# Patient Record
Sex: Female | Born: 1975
Health system: Southern US, Community
[De-identification: ages and names within clinical notes are randomized; demographics above are authoritative.]

## PROBLEM LIST (undated history)

## (undated) DIAGNOSIS — G43909 Migraine, unspecified, not intractable, without status migrainosus: Secondary | ICD-10-CM

## (undated) DIAGNOSIS — R6510 Systemic inflammatory response syndrome (SIRS) of non-infectious origin without acute organ dysfunction: Secondary | ICD-10-CM

## (undated) DIAGNOSIS — N3090 Cystitis, unspecified without hematuria: Secondary | ICD-10-CM

## (undated) DIAGNOSIS — I38 Endocarditis, valve unspecified: Secondary | ICD-10-CM

## (undated) DIAGNOSIS — Z8489 Family history of other specified conditions: Secondary | ICD-10-CM

## (undated) HISTORY — PX: WISDOM TOOTH EXTRACTION: SHX21

---

## 2009-07-12 ENCOUNTER — Emergency Department (HOSPITAL_COMMUNITY): Admission: EM | Admit: 2009-07-12 | Discharge: 2009-07-12 | Payer: Self-pay | Admitting: Emergency Medicine

## 2012-02-07 ENCOUNTER — Encounter (HOSPITAL_COMMUNITY): Payer: Self-pay | Admitting: *Deleted

## 2012-02-07 ENCOUNTER — Emergency Department (HOSPITAL_COMMUNITY): Payer: 59

## 2012-02-07 ENCOUNTER — Emergency Department (HOSPITAL_COMMUNITY)
Admission: EM | Admit: 2012-02-07 | Discharge: 2012-02-07 | Disposition: A | Discharge status: Home / Self Care | Payer: 59 | Attending: Emergency Medicine | Admitting: Emergency Medicine

## 2012-02-07 DIAGNOSIS — O034 Incomplete spontaneous abortion without complication: Secondary | ICD-10-CM

## 2012-02-07 DIAGNOSIS — O039 Complete or unspecified spontaneous abortion without complication: Secondary | ICD-10-CM

## 2012-02-07 LAB — TYPE AND SCREEN
ABO/RH(D): B POS
Antibody Screen: NEGATIVE

## 2012-02-07 LAB — POCT I-STAT, CHEM 8
Calcium, Ion: 1.18 mmol/L (ref 1.12–1.23)
Chloride: 104 mEq/L (ref 96–112)
HCT: 42 % (ref 36.0–46.0)
Sodium: 138 mEq/L (ref 135–145)

## 2012-02-07 LAB — CBC
HCT: 36.2 % (ref 36.0–46.0)
Hemoglobin: 12 g/dL (ref 12.0–15.0)
MCH: 28 pg (ref 26.0–34.0)
MCHC: 33.1 g/dL (ref 30.0–36.0)
MCV: 84.6 fL (ref 78.0–100.0)
Platelets: 275 K/uL (ref 150–400)
RBC: 4.28 MIL/uL (ref 3.87–5.11)
RDW: 12.1 % (ref 11.5–15.5)
WBC: 7 K/uL (ref 4.0–10.5)

## 2012-02-07 LAB — ABO/RH: ABO/RH(D): B POS

## 2012-02-07 MED ORDER — IBUPROFEN 600 MG PO TABS: 600.0000 mg | ORAL_TABLET | Freq: Four times a day (QID) | ORAL | Status: AC | PRN

## 2012-02-07 MED ORDER — OXYCODONE-ACETAMINOPHEN 5-325 MG PO TABS: 2.0000 | ORAL_TABLET | Freq: Once | ORAL | Status: DC

## 2012-02-07 MED ORDER — ONDANSETRON 4 MG PO TBDP
4.0000 mg | ORAL_TABLET | Freq: Once | ORAL | Status: AC
  Administered 2012-02-07: 4 mg via ORAL
  Filled 2012-02-07: qty 1

## 2012-02-07 MED ORDER — HYDROMORPHONE HCL PF 1 MG/ML IJ SOLN
1.0000 mg | INTRAMUSCULAR | Status: AC
  Administered 2012-02-07: 1 mg via INTRAMUSCULAR
  Filled 2012-02-07: qty 1

## 2012-02-07 MED ORDER — HYDROCODONE-ACETAMINOPHEN 5-325 MG PO TABS: 2.0000 | ORAL_TABLET | Freq: Four times a day (QID) | ORAL | Status: AC | PRN

## 2012-02-07 MED ORDER — IBUPROFEN 200 MG PO TABS
600.0000 mg | ORAL_TABLET | Freq: Once | ORAL | Status: AC
  Administered 2012-02-07: 600 mg via ORAL
  Filled 2012-02-07: qty 3

## 2012-02-07 NOTE — ED Notes (Signed)
Pt reports taking a + preg test x 1 month ago.  Reports bleeding x 2 days, heavy today with clotting.  LMP-11/16/11.  Pt reports abd pain today.

## 2012-02-07 NOTE — ED Notes (Signed)
Patient transported to [facility] 

## 2012-02-07 NOTE — ED Notes (Signed)
Pt returned from [facility]

## 2012-02-07 NOTE — ED Provider Notes (Signed)
History     CSN: [id number]  Arrival date & time 02/07/12  1313   First MD Initiated Contact with Patient 02/07/12 1503      Chief Complaint  Patient presents with  . Vaginal Bleeding    (Consider location/radiation/quality/duration/timing/severity/associated sxs/prior treatment) HPI Krystal Shaffer is a 36 y.o. female G5 P3 A1, with last menstrual period 11/16/2011. She did take a pregnancy test which was positive about a month ago. She also does have a history of one prior spontaneous abortion occurred about 12 weeks pregnancy. On Monday the patient began having some spotting progressed to more brisk bleeding including clots. Patient's bili continued to Tuesday where she passed clots and much of the mid tissue. She's been having some cramping abdominal pain has been located in the lower abdomen and suprapubic region, does not radiate, is crampy, start off as a 5/10 and is now an 8/10. She noticed no dysuria, no fevers, no chills, no other changes in discharge.  The patient thinks she is having a miscarriage.  No surgical history the patient has a history of migraines.   History reviewed. No pertinent past medical history.  History reviewed. No pertinent past surgical history.  No family history on file.  History  Substance Use Topics  . Smoking status: Never Smoker   . Smokeless tobacco: Not on file  . Alcohol Use: Yes     social    OB History    Grav Para Term Preterm Abortions TAB SAB Ect Mult Living                  Review of Systems Positive for abdominal cramping and vaginal bleeding. Positive for pregnancy. Patient denies any fevers or chills, changes in vision, earache, sore throat, neck pain or stiffness, chest pain or pressure, palpitations, syncope, dyspnea, cough, wheezing,  nausea, vomiting, diarrhea, melena, red bloody stools, frequency, dysuria, myalgias, arthralgias, back pain, recent trauma, rash, itching, skin lesions, easy bruising or bleeding, headache,  seizures, numbness, tingling or weakness and denies depression, and anxiety.    Allergies  Review of patient's allergies indicates no known allergies.  Home Medications  No current outpatient prescriptions on file.  BP 134/88  Pulse 88  Temp 98.9 F (37.2 C) (Oral)  Resp 20  SpO2 97%  LMP 11/16/2011  Physical Exam VITAL SIGNS:   Filed Vitals:   02/07/12 1424  BP: 134/88  Pulse: 88  Temp: 98.9 F (37.2 C)  Resp: 20   CONSTITUTIONAL: Awake, oriented, appears non-toxic HENT: Atraumatic, normocephalic, oral mucosa pink and moist, airway patent. Nares patent without drainage. External ears normal. EYES: Conjunctiva clear, EOMI, PERRLA NECK: Trachea midline, non-tender, supple CARDIOVASCULAR: Normal heart rate, Normal rhythm, No murmurs, rubs, gallops PULMONARY/CHEST: Clear to auscultation, no rhonchi, wheezes, or rales. Symmetrical breath sounds. Non-tender. ABDOMINAL: Non-distended, soft, non-tender - no rebound or guarding.  BS normal. NEUROLOGIC: Non-focal, moving all four extremities, no gross sensory or motor deficits. EXTREMITIES: No clubbing, cyanosis, or edema SKIN: Warm, Dry, No erythema, No rash Pelvic exam: normal external genitalia, vulva, vagina with about 1/4 cup dark red blood and clot in vault, uterus and adnexa, CERVIX: normal appearing cervix without lesions, passing dark red blood and clot, multiparous os - os open.   ED Course  Procedures (including critical care time)  Labs Reviewed  POCT I-STAT, CHEM 8 - Abnormal; Notable for the following:    Potassium 3.3 (*)     BUN <3 (*)     All other components within normal  limits  POCT PREGNANCY, URINE - Abnormal; Notable for the following:    Preg Test, Ur POSITIVE (*)     All other components within normal limits  CBC  TYPE AND SCREEN  ABO/RH  LAB REPORT - SCANNED   No results found.   No diagnosis found.    MDM  Krystal Shaffer is a 36 y.o. female presenting with incomplete abortion. Bedside  ultrasound shows no FHR or movement with indistinct landmarks.  Formal ultrasound shows incomplete abortion.  Pt is non-toxic and afebrile, no foul odor on pelvic exam, abdomen is appropriately crampy, but non-tender and not surgical.  I discussed the diagnosis with the patient with the explicit concern from retained fetal products and the possibility that she may require DNC.  The patient understands her diagnosis, warning signs of infection and reasons tot return to the ER including worsening pain, foul discharge, fever, chills, nausea or vomiting or any other concerning symptoms.  She will follow up with her obstetrician.        Jones Skene, MD 02/13/12 1610

## 2012-02-07 NOTE — ED Notes (Signed)
MD at bedside. Dr. Bonk at bedside.  

## 2012-02-07 NOTE — ED Notes (Signed)
Pt medicated for pain. Pt states she has a ride home.

## 2013-07-22 ENCOUNTER — Encounter (HOSPITAL_COMMUNITY): Payer: Self-pay | Admitting: Emergency Medicine

## 2013-07-22 ENCOUNTER — Ambulatory Visit (HOSPITAL_COMMUNITY): Admission: RE | Admit: 2013-07-22 | Payer: 59 | Source: Ambulatory Visit

## 2013-07-22 ENCOUNTER — Emergency Department (HOSPITAL_COMMUNITY)
Admission: EM | Admit: 2013-07-22 | Discharge: 2013-07-22 | Disposition: A | Discharge status: Home / Self Care | Payer: 59 | Attending: Emergency Medicine | Admitting: Emergency Medicine

## 2013-07-22 ENCOUNTER — Emergency Department (HOSPITAL_COMMUNITY): Payer: 59

## 2013-07-22 DIAGNOSIS — J159 Unspecified bacterial pneumonia: Secondary | ICD-10-CM | POA: Insufficient documentation

## 2013-07-22 DIAGNOSIS — Z79899 Other long term (current) drug therapy: Secondary | ICD-10-CM | POA: Insufficient documentation

## 2013-07-22 DIAGNOSIS — O9989 Other specified diseases and conditions complicating pregnancy, childbirth and the puerperium: Secondary | ICD-10-CM | POA: Insufficient documentation

## 2013-07-22 DIAGNOSIS — R55 Syncope and collapse: Secondary | ICD-10-CM | POA: Insufficient documentation

## 2013-07-22 DIAGNOSIS — Z87828 Personal history of other (healed) physical injury and trauma: Secondary | ICD-10-CM | POA: Insufficient documentation

## 2013-07-22 DIAGNOSIS — J189 Pneumonia, unspecified organism: Secondary | ICD-10-CM

## 2013-07-22 DIAGNOSIS — O210 Mild hyperemesis gravidarum: Secondary | ICD-10-CM | POA: Insufficient documentation

## 2013-07-22 DIAGNOSIS — Z792 Long term (current) use of antibiotics: Secondary | ICD-10-CM | POA: Insufficient documentation

## 2013-07-22 DIAGNOSIS — K5900 Constipation, unspecified: Secondary | ICD-10-CM

## 2013-07-22 DIAGNOSIS — G43909 Migraine, unspecified, not intractable, without status migrainosus: Secondary | ICD-10-CM | POA: Insufficient documentation

## 2013-07-22 DIAGNOSIS — Z3490 Encounter for supervision of normal pregnancy, unspecified, unspecified trimester: Secondary | ICD-10-CM

## 2013-07-22 HISTORY — DX: Migraine, unspecified, not intractable, without status migrainosus: G43.909

## 2013-07-22 LAB — CBC WITH DIFFERENTIAL/PLATELET
Basophils Absolute: 0 10*3/uL (ref 0.0–0.1)
Basophils Relative: 0 % (ref 0–1)
Eosinophils Absolute: 0 10*3/uL (ref 0.0–0.7)
Eosinophils Relative: 1 % (ref 0–5)
HCT: 37.6 % (ref 36.0–46.0)
Hemoglobin: 12.4 g/dL (ref 12.0–15.0)
LYMPHS ABS: 1.8 10*3/uL (ref 0.7–4.0)
LYMPHS PCT: 29 % (ref 12–46)
MCH: 28.2 pg (ref 26.0–34.0)
MCHC: 33 g/dL (ref 30.0–36.0)
MCV: 85.6 fL (ref 78.0–100.0)
Monocytes Absolute: 0.6 10*3/uL (ref 0.1–1.0)
Monocytes Relative: 10 % (ref 3–12)
NEUTROS ABS: 3.8 10*3/uL (ref 1.7–7.7)
Neutrophils Relative %: 61 % (ref 43–77)
PLATELETS: 246 10*3/uL (ref 150–400)
RBC: 4.39 MIL/uL (ref 3.87–5.11)
RDW: 11.9 % (ref 11.5–15.5)
WBC: 6.3 10*3/uL (ref 4.0–10.5)

## 2013-07-22 LAB — URINALYSIS, ROUTINE W REFLEX MICROSCOPIC
GLUCOSE, UA: NEGATIVE mg/dL
Hgb urine dipstick: NEGATIVE
Ketones, ur: NEGATIVE mg/dL
LEUKOCYTES UA: NEGATIVE
Nitrite: NEGATIVE
PH: 6 (ref 5.0–8.0)
Protein, ur: NEGATIVE mg/dL
Specific Gravity, Urine: 1.028 (ref 1.005–1.030)
Urobilinogen, UA: 1 mg/dL (ref 0.0–1.0)

## 2013-07-22 LAB — COMPREHENSIVE METABOLIC PANEL
ALT: 9 U/L (ref 0–35)
AST: 13 U/L (ref 0–37)
Albumin: 3.9 g/dL (ref 3.5–5.2)
Alkaline Phosphatase: 46 U/L (ref 39–117)
BILIRUBIN TOTAL: 0.4 mg/dL (ref 0.3–1.2)
BUN: 8 mg/dL (ref 6–23)
CALCIUM: 9.3 mg/dL (ref 8.4–10.5)
CO2: 24 meq/L (ref 19–32)
Chloride: 99 mEq/L (ref 96–112)
Creatinine, Ser: 0.56 mg/dL (ref 0.50–1.10)
Glucose, Bld: 82 mg/dL (ref 70–99)
Potassium: 3.8 mEq/L (ref 3.7–5.3)
SODIUM: 135 meq/L — AB (ref 137–147)
Total Protein: 7.8 g/dL (ref 6.0–8.3)

## 2013-07-22 LAB — HCG, QUANTITATIVE, PREGNANCY: hCG, Beta Chain, Quant, S: 83383 m[IU]/mL — ABNORMAL HIGH (ref <5)

## 2013-07-22 LAB — LIPASE, BLOOD: Lipase: 36 U/L (ref 11–59)

## 2013-07-22 LAB — D-DIMER, QUANTITATIVE: D-Dimer, Quant: 0.29 ug/mL-FEU (ref 0.00–0.48)

## 2013-07-22 LAB — POCT PREGNANCY, URINE: Preg Test, Ur: POSITIVE — AB

## 2013-07-22 MED ORDER — PROCHLORPERAZINE EDISYLATE 5 MG/ML IJ SOLN
10.0000 mg | Freq: Once | INTRAMUSCULAR | Status: AC
  Administered 2013-07-22: 10 mg via INTRAVENOUS
  Filled 2013-07-22: qty 2

## 2013-07-22 MED ORDER — DIPHENHYDRAMINE HCL 50 MG/ML IJ SOLN
25.0000 mg | Freq: Once | INTRAMUSCULAR | Status: AC
  Administered 2013-07-22: 25 mg via INTRAVENOUS
  Filled 2013-07-22: qty 1

## 2013-07-22 MED ORDER — SODIUM CHLORIDE 0.9 % IV BOLUS (SEPSIS)
1000.0000 mL | Freq: Once | INTRAVENOUS | Status: AC
  Administered 2013-07-22: 1000 mL via INTRAVENOUS

## 2013-07-22 MED ORDER — KETOROLAC TROMETHAMINE 30 MG/ML IJ SOLN
30.0000 mg | Freq: Once | INTRAMUSCULAR | Status: AC
  Administered 2013-07-22: 30 mg via INTRAVENOUS
  Filled 2013-07-22 (×2): qty 1

## 2013-07-22 NOTE — ED Notes (Signed)
Patient transported to X-ray 

## 2013-07-22 NOTE — Progress Notes (Signed)
   CARE MANAGEMENT ED NOTE 07/22/2013  Patient:  DAMESHA, Krystal Shaffer   Account Number:  [id number]  Date Initiated:  07/22/2013  Documentation initiated by:  Livia Snellen  Subjective/Objective Assessment:   Patient presents to Ed with  migraine x 1.5 weeks and chest tightness and SOB x 2 days     Subjective/Objective Assessment Detail:     Action/Plan:   Action/Plan Detail:   Anticipated DC Date:       Status Recommendation to Physician:   Result of Recommendation:    Other ED [place]  Other  PCP issues    Choice offered to / List presented to:            Status of service:  Completed, signed off  ED Comments:   ED Comments Detail:  Patient reports her pcp is Everardo Beals NP from [facility].  System updated.

## 2013-07-22 NOTE — Discharge Instructions (Signed)
Read the information below.  You may return to the Emergency Department at any time for worsening condition or any new symptoms that concern you.  Please follow up with the ObGyn of your choice.  Take the medications as prescribed by [facility].  As we discussed, please discuss safety of medications in pregnancy if you decide not to terminate the pregnancy.  If you develop high fevers, uncontrolled pain, difficulty breathing, uncontrolled vomiting, or you are not tolerate fluids by mouth, return to the ER immediately for a recheck.    Constipation, Adult Constipation is when a person has fewer than 3 bowel movements a week; has difficulty having a bowel movement; or has stools that are dry, hard, or larger than normal. As people grow older, constipation is more common. If you try to fix constipation with medicines that make you have a bowel movement (laxatives), the problem may get worse. Long-term laxative use may cause the muscles of the colon to become weak. A low-fiber diet, not taking in enough fluids, and taking certain medicines may make constipation worse. CAUSES   Certain medicines, such as antidepressants, pain medicine, iron supplements, antacids, and water pills.   Certain diseases, such as diabetes, irritable bowel syndrome (IBS), thyroid disease, or depression.   Not drinking enough water.   Not eating enough fiber-rich foods.   Stress or travel.  Lack of physical activity or exercise.  Not going to the restroom when there is the urge to have a bowel movement.  Ignoring the urge to have a bowel movement.  Using laxatives too much. SYMPTOMS   Having fewer than 3 bowel movements a week.   Straining to have a bowel movement.   Having hard, dry, or larger than normal stools.   Feeling full or bloated.   Pain in the lower abdomen.  Not feeling relief after having a bowel movement. DIAGNOSIS  Your caregiver will take a medical history and perform a  physical exam. Further testing may be done for severe constipation. Some tests may include:   A barium enema X-ray to examine your rectum, colon, and sometimes, your small intestine.  A sigmoidoscopy to examine your lower colon.  A colonoscopy to examine your entire colon. TREATMENT  Treatment will depend on the severity of your constipation and what is causing it. Some dietary treatments include drinking more fluids and eating more fiber-rich foods. Lifestyle treatments may include regular exercise. If these diet and lifestyle recommendations do not help, your caregiver may recommend taking over-the-counter laxative medicines to help you have bowel movements. Prescription medicines may be prescribed if over-the-counter medicines do not work.  HOME CARE INSTRUCTIONS   Increase dietary fiber in your diet, such as fruits, vegetables, whole grains, and beans. Limit high-fat and processed sugars in your diet, such as [place] fries, hamburgers, cookies, candies, and soda.   A fiber supplement may be added to your diet if you cannot get enough fiber from foods.   Drink enough fluids to keep your urine clear or pale yellow.   Exercise regularly or as directed by your caregiver.   Go to the restroom when you have the urge to go. Do not hold it.  Only take medicines as directed by your caregiver. Do not take other medicines for constipation without talking to your caregiver first. [place] IF:   You have bright red blood in your stool.   Your constipation lasts for more than 4 days or gets worse.   You have  abdominal or rectal pain.   You have thin, pencil-like stools.  You have unexplained weight loss. MAKE SURE YOU:   Understand these instructions.  Will watch your condition.  Will get help right away if you are not doing well or get worse. Document Released: 02/25/2004 Document Revised: 08/21/2011 Document Reviewed: 03/10/2013 [facility] Patient Information  2014 [place], [place].  Headaches, Frequently Asked Questions MIGRAINE HEADACHES Q: What is migraine? What causes it? How can I treat it? A: Generally, migraine headaches begin as a dull ache. Then they develop into a constant, throbbing, and pulsating pain. You may experience pain at the temples. You may experience pain at the front or back of one or both sides of the head. The pain is usually accompanied by a combination of:  Nausea.  Vomiting.  Sensitivity to light and noise. Some people (about 15%) experience an aura (see below) before an attack. The cause of migraine is believed to be chemical reactions in the brain. Treatment for migraine may include over-the-counter or prescription medications. It may also include self-help techniques. These include relaxation training and biofeedback.  Q: What is an aura? A: About 15% of people with migraine get an "aura". This is a sign of neurological symptoms that occur before a migraine headache. You may see wavy or jagged lines, dots, or flashing lights. You might experience tunnel vision or blind spots in one or both eyes. The aura can include visual or auditory hallucinations (something imagined). It may include disruptions in smell (such as strange odors), taste or touch. Other symptoms include:  Numbness.  A "pins and needles" sensation.  Difficulty in recalling or speaking the correct word. These neurological events may last as long as 60 minutes. These symptoms will fade as the headache begins. Q: What is a trigger? A: Certain physical or environmental factors can lead to or "trigger" a migraine. These include:  Foods.  Hormonal changes.  Weather.  Stress. It is important to remember that triggers are different for everyone. To help prevent migraine attacks, you need to figure out which triggers affect you. Keep a headache diary. This is a good way to track triggers. The diary will help you talk to your healthcare professional about  your condition. Q: Does weather affect migraines? A: Bright sunshine, hot, humid conditions, and drastic changes in barometric pressure may lead to, or "trigger," a migraine attack in some people. But studies have shown that weather does not act as a trigger for everyone with migraines. Q: What is the link between migraine and hormones? A: Hormones start and regulate many of your body's functions. Hormones keep your body in balance within a constantly changing environment. The levels of hormones in your body are unbalanced at times. Examples are during menstruation, pregnancy, or menopause. That can lead to a migraine attack. In fact, about three quarters of all women with migraine report that their attacks are related to the menstrual cycle.  Q: Is there an increased risk of stroke for migraine sufferers? A: The likelihood of a migraine attack causing a stroke is very remote. That is not to say that migraine sufferers cannot have a stroke associated with their migraines. In persons under age 68, the most common associated factor for stroke is migraine headache. But over the course of a person's normal life span, the occurrence of migraine headache may actually be associated with a reduced risk of dying from cerebrovascular disease due to stroke.  Q: What are acute medications for migraine? A: Acute medications are  used to treat the pain of the headache after it has started. Examples over-the-counter medications, NSAIDs, ergots, and triptans.  Q: What are the triptans? A: Triptans are the newest class of abortive medications. They are specifically targeted to treat migraine. Triptans are vasoconstrictors. They moderate some chemical reactions in the brain. The triptans work on receptors in your brain. Triptans help to restore the balance of a neurotransmitter called serotonin. Fluctuations in levels of serotonin are thought to be a main cause of migraine.  Q: Are over-the-counter medications for migraine  effective? A: Over-the-counter, or "OTC," medications may be effective in relieving mild to moderate pain and associated symptoms of migraine. But you should see your caregiver before beginning any treatment regimen for migraine.  Q: What are preventive medications for migraine? A: Preventive medications for migraine are sometimes referred to as "prophylactic" treatments. They are used to reduce the frequency, severity, and length of migraine attacks. Examples of preventive medications include antiepileptic medications, antidepressants, beta-blockers, calcium channel blockers, and NSAIDs (nonsteroidal anti-inflammatory drugs). Q: Why are anticonvulsants used to treat migraine? A: During the past few years, there has been an increased interest in antiepileptic drugs for the prevention of migraine. They are sometimes referred to as "anticonvulsants". Both epilepsy and migraine may be caused by similar reactions in the brain.  Q: Why are antidepressants used to treat migraine? A: Antidepressants are typically used to treat people with depression. They may reduce migraine frequency by regulating chemical levels, such as serotonin, in the brain.  Q: What alternative therapies are used to treat migraine? A: The term "alternative therapies" is often used to describe treatments considered outside the scope of conventional Western medicine. Examples of alternative therapy include acupuncture, acupressure, and yoga. Another common alternative treatment is herbal therapy. Some herbs are believed to relieve headache pain. Always discuss alternative therapies with your caregiver before proceeding. Some herbal products contain arsenic and other toxins. TENSION HEADACHES Q: What is a tension-type headache? What causes it? How can I treat it? A: Tension-type headaches occur randomly. They are often the result of temporary stress, anxiety, fatigue, or anger. Symptoms include soreness in your temples, a tightening  band-like sensation around your head (a "vice-like" ache). Symptoms can also include a pulling feeling, pressure sensations, and contracting head and neck muscles. The headache begins in your forehead, temples, or the back of your head and neck. Treatment for tension-type headache may include over-the-counter or prescription medications. Treatment may also include self-help techniques such as relaxation training and biofeedback. CLUSTER HEADACHES Q: What is a cluster headache? What causes it? How can I treat it? A: Cluster headache gets its name because the attacks come in groups. The pain arrives with little, if any, warning. It is usually on one side of the head. A tearing or bloodshot eye and a runny nose on the same side of the headache may also accompany the pain. Cluster headaches are believed to be caused by chemical reactions in the brain. They have been described as the most severe and intense of any headache type. Treatment for cluster headache includes prescription medication and oxygen. SINUS HEADACHES Q: What is a sinus headache? What causes it? How can I treat it? A: When a cavity in the bones of the face and skull (a sinus) becomes inflamed, the inflammation will cause localized pain. This condition is usually the result of an allergic reaction, a tumor, or an infection. If your headache is caused by a sinus blockage, such as an infection, you will  probably have a fever. An x-ray will confirm a sinus blockage. Your caregiver's treatment might include antibiotics for the infection, as well as antihistamines or decongestants.  REBOUND HEADACHES Q: What is a rebound headache? What causes it? How can I treat it? A: A pattern of taking acute headache medications too often can lead to a condition known as "rebound headache." A pattern of taking too much headache medication includes taking it more than 2 days per week or in excessive amounts. That means more than the label or a caregiver advises.  With rebound headaches, your medications not only stop relieving pain, they actually begin to cause headaches. Doctors treat rebound headache by tapering the medication that is being overused. Sometimes your caregiver will gradually substitute a different type of treatment or medication. Stopping may be a challenge. Regularly overusing a medication increases the potential for serious side effects. Consult a caregiver if you regularly use headache medications more than 2 days per week or more than the label advises. ADDITIONAL QUESTIONS AND ANSWERS Q: What is biofeedback? A: Biofeedback is a self-help treatment. Biofeedback uses special equipment to monitor your body's involuntary physical responses. Biofeedback monitors:  Breathing.  Pulse.  Heart rate.  Temperature.  Muscle tension.  Brain activity. Biofeedback helps you refine and perfect your relaxation exercises. You learn to control the physical responses that are related to stress. Once the technique has been mastered, you do not need the equipment any more. Q: Are headaches hereditary? A: Four out of five (80%) of people that suffer report a family history of migraine. Scientists are not sure if this is genetic or a family predisposition. Despite the uncertainty, a child has a 50% chance of having migraine if one parent suffers. The child has a 75% chance if both parents suffer.  Q: Can children get headaches? A: By the time they reach high school, most young people have experienced some type of headache. Many safe and effective approaches or medications can prevent a headache from occurring or stop it after it has begun.  Q: What type of doctor should I see to diagnose and treat my headache? A: Start with your primary caregiver. Discuss his or her experience and approach to headaches. Discuss methods of classification, diagnosis, and treatment. Your caregiver may decide to recommend you to a headache specialist, depending upon your  symptoms or other physical conditions. Having diabetes, allergies, etc., may require a more comprehensive and inclusive approach to your headache. The National Headache Foundation will provide, upon request, a list of [facility] physician members in your state. Document Released: 08/19/2003 Document Revised: 08/21/2011 Document Reviewed: 01/27/2008 [facility] Patient Information 2014 [place].  Medicines During Pregnancy During pregnancy, there are medicines that are either safe or unsafe to take. Medicines include prescriptions from your caregiver, over-the-counter medicines, topical creams applied to the skin, and all herbal substances. Medicines are put into either Class A, B, C, or D. Class A and B medicines have been shown to be safe in pregnancy. Class C medicines are also considered to be safe in pregnancy, but these medicines should only be used when necessary. Class D medicines should not be used at all in pregnancy. They can be harmful to a baby.  It is best to take as little medicine as possible while pregnant. However, some medicines are necessary to take for the mother and baby's health. Sometimes, it is more dangerous to stop taking certain medicines than to stay on them. This is often the case for people with  long-term (chronic) conditions such as asthma, diabetes, or high blood pressure (hypertension). If you are pregnant and have a chronic illness, call your caregiver right away. Bring a list of your medicines and their doses to your appointments. If you are planning to become pregnant, schedule a doctor's appointment and discuss your medicines with your caregiver. Lastly, write down the phone number to your pharmacist. They can answer questions regarding a medicine's class and safety. They cannot give advice as to whether you should or should not be on a medicine.  SAFE AND UNSAFE MEDICINES There is a long list of medicines that are considered safe in pregnancy. Below is a shorter list. For  specific medicines, ask your caregiver.  AllergyMedicines Loratadine, cetirizine, and chlorpheniramine are safe to take. Certain nasal steroid sprays are safe. Talk to your caregiver about specific brands that are safe. Analgesics Acetaminophen and acetaminophen with codeine are safe to take. All other nonsteroidal anti-inflammatory drugs (NSAIDS) are not safe. This includes ibuprofen.  Antacids Many over-the-counter antacids are safe to take. Talk to your caregiver about specific brands that are safe. Famotidine, ranitidine, and lansoprazole are safe. Omepresole is considered safe to take in the second trimester. Antibiotic Medicines There are several antibiotics to avoid. These include, but are not limited to, tetracyline, quinolones, and sulfa medications. Talk to your caregiver before taking any antibiotic.  Antihistamines Talk to your caregiver about specific brands that are safe.  Asthma Medicines Most asthma steroid inhalers are safe to take. Talk to your caregiver for specific details. Calcium Calcium supplements are safe to take. Do not take oyster shell calcium.  Cough and Cold Medicines It is safe to take products with guaifenesin or dextromethorphan. Talk to your caregiver about specific brands that are safe. It is not safe to take products that contain aspirin or ibuprofen. Decongestant Medicines Pseudoephedrine-containing products are safe to take in the second and third trimester.  Depression Medicines Talk about these medicines with your caregiver.  Antidiarrheal Medicines It is safe to take loperamide. Talk to your caregiver about specific brands that are safe. It is not safe to take any antidiarrheal medicine that contains bismuth. Eyedrops Allergy eyedrops should be limited.  Iron It is safe to use certain iron-containing medicines for anemia in pregnancy. They require a prescription.  Antinausea Medicines It is safe to take doxylamine and vitamin B6 as  directed. There are other prescription medicines available, if needed.  Sleep aids It is safe to take diphenhydramine and acetaminophen with diphenhydramine.  Steroids Hydrocortisone creams are safe to use as directed. Oral steroids require a prescription. It is not safe to take any hemorrhoid cream with pramoxine or phenylephrine. Stool softener It is safe to take stool softener medicines. Avoid daily or prolonged use of stool softeners. Thyroid Medicine It is important to stay on this thyroid medicine. It needs to be followed by your caregiver.  Vaginal Medicines Your caregiver will prescribe a medicine to you if you have a vaginal infection. Certain antifungal medicines are safe to use if you have a sexually transmitted infection (STI). Talk to your caregiver.  Document Released: 05/29/2005 Document Revised: 08/21/2011 Document Reviewed: 05/30/2011 [facility] Patient Information 2014 [place].

## 2013-07-22 NOTE — ED Provider Notes (Signed)
CSN: [id number]     Arrival date & time 07/22/13  1537 History   First MD Initiated Contact with Patient 07/22/13 1639     Chief Complaint  Patient presents with  . Migraine  . Chest Pain     (Consider location/radiation/quality/duration/timing/severity/associated sxs/prior Treatment) The history is provided by the patient.   Patient with multiple complaints.  1. Patient has had migraine headache x 1.5 weeks.  The pain is in her bilateral temples, constant.  Associated N/V.  Has tried new medications (Topamax and ralpax) that caused disorientation and slurred speech, caused her to fall down two stairs approximately 6 days ago.  Reports LOC and hitting head.  Has also seen neurologist since then and was prescribed baclofen but is no longer taking any medications for this.  States the pain is exactly like her chronic pain but more intense.  2. Pt also reports chest pain and shortness of breath that began 3 days ago.  States the pain is central anterior and constant, feels "like someone has their foot stuck" in her chest.  Was seen at urgent care 4 days ago and chest xray revealed fluid or pneumonia, was prescribed Levaquin, which she is taking but states she does not think she is holding any of it down due to all the vomiting.  Denies cough or fever.  Temp has been as high as 99.  Denies nasal congestion, rhinorrhea, sore throat, ear pain.  Has uncle who has had blood clots, no other know family hx blood clots.  No personal hx blood clots.  No recent leg swelling, immobilization.  No exogenous estrogen.   3. Pt also with lower abdominal pain and constipation that is improving.  States this is one of the reasons she went to urgent care last week but she has not taken the prescribed stool softener because she has been drinking so much water she started pushing out "pebbles and water" two days ago.  Had an xray at urgent care that revealed constipation.   Past Medical History  Diagnosis Date  .  Migraine    History reviewed. No pertinent past surgical history. History reviewed. No pertinent family history. History  Substance Use Topics  . Smoking status: Never Smoker   . Smokeless tobacco: Not on file  . Alcohol Use: No   OB History   Grav Para Term Preterm Abortions TAB SAB Ect Mult Living                 Review of Systems  Constitutional: Negative for fever, chills and appetite change.  HENT: Negative for congestion, rhinorrhea and sore throat.   Respiratory: Positive for shortness of breath. Negative for cough.   Cardiovascular: Positive for chest pain.  Gastrointestinal: Positive for nausea, vomiting, abdominal pain and constipation. Negative for diarrhea.  Genitourinary: Negative for dysuria, urgency, frequency, vaginal bleeding, vaginal discharge and menstrual problem.  Neurological: Positive for syncope and headaches. Negative for weakness and numbness.  All other systems reviewed and are negative.      Allergies  Review of patient's allergies indicates no known allergies.  Home Medications   Current Outpatient Rx  Name  Route  Sig  Dispense  Refill  . baclofen (LIORESAL) 10 MG tablet   Oral   Take 10 mg by mouth 2 (two) times daily as needed for muscle spasms.         Marland Kitchen eletriptan (RELPAX) 40 MG tablet   Oral   Take 40 mg by mouth as needed for migraine  or headache. One tablet by mouth at onset of headache. May repeat in 2 hours if headache persists or recurs.         Marland Kitchen levofloxacin (LEVAQUIN) 500 MG tablet   Oral   Take 500 mg by mouth daily. For 10 days.         Marland Kitchen lubiprostone (AMITIZA) 24 MCG capsule   Oral   Take 24 mcg by mouth 2 (two) times daily with a meal. For 4 days.         . promethazine-dextromethorphan (PROMETHAZINE-DM) 6.25-15 MG/5ML syrup   Oral   Take 5 mLs by mouth every 6 (six) hours as needed for cough.         . zonisamide (ZONEGRAN) 25 MG capsule   Oral   Take 25 mg by mouth 4 (four) times daily. Migraine  prevention.          BP 118/86  Pulse 84  Temp(Src) 98.5 F (36.9 C) (Oral)  Resp 16  SpO2 100%  LMP 06/25/2013 Physical Exam  Nursing note and vitals reviewed. Constitutional: She appears well-developed and well-nourished. No distress.  HENT:  Head: Normocephalic and atraumatic.  Eyes: Conjunctivae are normal.  Neck: Neck supple.  Cardiovascular: Normal rate and regular rhythm.   Pulmonary/Chest: Effort normal and breath sounds normal. No respiratory distress. She has no wheezes. She has no rales.  Abdominal: Soft. She exhibits no distension. There is tenderness. There is no rebound and no guarding.  Diffuse lower abdominal tenderness  Musculoskeletal: She exhibits no edema.  Neurological: She is alert.  CN II-XII intact, EOMs intact, no pronator drift, grip strengths equal bilaterally; strength 5/5 in all extremities, sensation intact in all extremities; finger to nose, heel to shin, rapid alternating movements normal; gait is normal.     Skin: She is not diaphoretic.    ED Course  Procedures (including critical care time) Labs Review Labs Reviewed  COMPREHENSIVE METABOLIC PANEL - Abnormal; Notable for the following:    Sodium 135 (*)    All other components within normal limits  URINALYSIS, ROUTINE W REFLEX MICROSCOPIC - Abnormal; Notable for the following:    Bilirubin Urine SMALL (*)    All other components within normal limits  HCG, QUANTITATIVE, PREGNANCY - Abnormal; Notable for the following:    hCG, Beta Chain, Quant, [place] [address] (*)    All other components within normal limits  POCT PREGNANCY, URINE - Abnormal; Notable for the following:    Preg Test, Ur POSITIVE (*)    All other components within normal limits  CBC WITH DIFFERENTIAL  LIPASE, BLOOD  D-DIMER, QUANTITATIVE   Imaging Review No results found.  EKG Interpretation   None     7:30 PM I had a long discussion with the patient regarding her results and the results we received from the [facility].  They were not able to send the images of the chest xray.  Patient made aware that she is pregnant.  She states that she intends to terminate the pregnancy.  We discussed doing another xray to confirm the pneumonia or check for any other abnormality.  The xray reading from urgent care calls this a RLL infiltrate (there was confusion from patient history of whether it was a "fluid collection" or pneumonia).  Pt declines new xray. Her d-dimer is negative, her O2 saturation is 100% and her HR is normal.  She is PERC negative. I have also offered to change the antibiotic to something that is considered safe  in pregnancy.  Pt declines this change.   MDM   Final diagnoses:  Pregnancy  Migraine headache  Constipation  CAP (community acquired pneumonia)    Pt with multiple complaints.  Hx migraine p/w c/o migraine, given migraine cocktail with improvement.  Normal neurologic exam. Fall was 6 days ago.  Doubt intracranial bleed or skull fracture.  Likely migraine, possibly exacerbated by hormone changes of pregnancy.  Has CP, SOB without cough, reporting abnormal xray.  CXR cancelled Dimer negative. Xray results received from outside facility but no images available.  Pt chooses to continue PNA treatment without new xray or change in medications (see discussion above) Has mild lower abdominal pain and constipation that is improving. Labs unremarkable. Pt d/c home with PCP and gyn follow up.  Pt chooses to continue medications prescribed by urgent care.  I did have a discussion with her about safety of medications in pregnancy but patient intends to terminate pregnancy and does not want any medication changes.   Discussed result, findings, treatment, and follow up  with patient.  Pt given return precautions.  Pt verbalizes understanding and agrees with plan.        Clayton Bibles, PA-C 07/22/13 2312

## 2013-07-22 NOTE — ED Notes (Signed)
Pt c/o migraine x 1.5 weeks and chest tightness and SOB x 2 days.  Pain score 8/10.  Pt reports that she sees a neurologist for her migraines and she has recently been told to "quit everything."  Reports that she was diagnosed w/ PNA x 3 days ago and given an injection.

## 2013-07-22 NOTE — ED Provider Notes (Addendum)
Medical screening examination/treatment/procedure(s) were performed by non-physician practitioner and as supervising physician I was immediately available for consultation/collaboration.  EKG Interpretation    Date/Time:  Tuesday July 22 2013 16:05:14 EST Ventricular Rate:  77 PR Interval:  154 QRS Duration: 91 QT Interval:  358 QTC Calculation: 405 R Axis:   74 Text Interpretation:  Sinus rhythm No old tracing to compare Confirmed by [facility]  MD, [place] [phone]) on 07/22/2013 11:58:56 PM             Threasa Beards, MD 07/22/13 0177  Threasa Beards, MD 07/22/13 [phone]

## 2017-05-08 ENCOUNTER — Emergency Department (HOSPITAL_COMMUNITY)
Admission: EM | Admit: 2017-05-08 | Discharge: 2017-05-08 | Disposition: A | Discharge status: Home / Self Care | Payer: 59 | Attending: Emergency Medicine | Admitting: Emergency Medicine

## 2017-05-08 ENCOUNTER — Emergency Department (HOSPITAL_COMMUNITY): Payer: 59

## 2017-05-08 ENCOUNTER — Encounter (HOSPITAL_COMMUNITY): Payer: Self-pay | Admitting: Emergency Medicine

## 2017-05-08 ENCOUNTER — Emergency Department (HOSPITAL_COMMUNITY)
Admission: EM | Admit: 2017-05-08 | Discharge: 2017-05-08 | Discharge status: Against Medical Advice | Payer: Self-pay | Attending: Emergency Medicine | Admitting: Emergency Medicine

## 2017-05-08 ENCOUNTER — Other Ambulatory Visit: Payer: Self-pay

## 2017-05-08 DIAGNOSIS — D259 Leiomyoma of uterus, unspecified: Secondary | ICD-10-CM

## 2017-05-08 DIAGNOSIS — Z5321 Procedure and treatment not carried out due to patient leaving prior to being seen by health care provider: Secondary | ICD-10-CM | POA: Insufficient documentation

## 2017-05-08 DIAGNOSIS — Z79899 Other long term (current) drug therapy: Secondary | ICD-10-CM | POA: Insufficient documentation

## 2017-05-08 DIAGNOSIS — N12 Tubulo-interstitial nephritis, not specified as acute or chronic: Secondary | ICD-10-CM

## 2017-05-08 DIAGNOSIS — R509 Fever, unspecified: Secondary | ICD-10-CM | POA: Diagnosis present

## 2017-05-08 HISTORY — DX: Migraine, unspecified, not intractable, without status migrainosus: G43.909

## 2017-05-08 LAB — URINALYSIS, ROUTINE W REFLEX MICROSCOPIC
GLUCOSE, UA: NEGATIVE mg/dL
Ketones, ur: NEGATIVE mg/dL
Leukocytes, UA: NEGATIVE
Nitrite: POSITIVE — AB
PH: 7 (ref 5.0–8.0)
Protein, ur: 30 mg/dL — AB
SQUAMOUS EPITHELIAL / LPF: NONE SEEN
Specific Gravity, Urine: 1.02 (ref 1.005–1.030)

## 2017-05-08 LAB — I-STAT BETA HCG BLOOD, ED (MC, WL, AP ONLY): I-stat hCG, quantitative: <5 m[IU]/mL (ref <5)

## 2017-05-08 LAB — COMPREHENSIVE METABOLIC PANEL
ALK PHOS: 72 U/L (ref 38–126)
ALT: 11 U/L — ABNORMAL LOW (ref 14–54)
AST: 14 U/L — ABNORMAL LOW (ref 15–41)
Albumin: 2.8 g/dL — ABNORMAL LOW (ref 3.5–5.0)
Anion gap: 7 (ref 5–15)
BUN: <5 mg/dL — ABNORMAL LOW (ref 6–20)
CALCIUM: 8.5 mg/dL — AB (ref 8.9–10.3)
CO2: 24 mmol/L (ref 22–32)
CREATININE: 0.62 mg/dL (ref 0.44–1.00)
Chloride: 105 mmol/L (ref 101–111)
GFR calc Af Amer: >60 mL/min (ref >60)
Glucose, Bld: 94 mg/dL (ref 65–99)
Potassium: 3.6 mmol/L (ref 3.5–5.1)
SODIUM: 136 mmol/L (ref 135–145)
Total Bilirubin: 0.7 mg/dL (ref 0.3–1.2)
Total Protein: 7.4 g/dL (ref 6.5–8.1)

## 2017-05-08 LAB — CBC WITH DIFFERENTIAL/PLATELET
Basophils Absolute: 0 10*3/uL (ref 0.0–0.1)
Basophils Relative: 0 %
EOS PCT: 0 %
Eosinophils Absolute: 0 10*3/uL (ref 0.0–0.7)
HCT: 33.3 % — ABNORMAL LOW (ref 36.0–46.0)
Hemoglobin: 10.7 g/dL — ABNORMAL LOW (ref 12.0–15.0)
LYMPHS ABS: 1.4 10*3/uL (ref 0.7–4.0)
Lymphocytes Relative: 15 %
MCH: 26 pg (ref 26.0–34.0)
MCHC: 32.1 g/dL (ref 30.0–36.0)
MCV: 81 fL (ref 78.0–100.0)
Monocytes Absolute: 0.5 10*3/uL (ref 0.1–1.0)
Monocytes Relative: 5 %
Neutro Abs: 7.5 10*3/uL (ref 1.7–7.7)
Neutrophils Relative %: 80 %
Platelets: 267 10*3/uL (ref 150–400)
RBC: 4.11 MIL/uL (ref 3.87–5.11)
RDW: 13.2 % (ref 11.5–15.5)
WBC: 9.4 10*3/uL (ref 4.0–10.5)

## 2017-05-08 LAB — RAPID HIV SCREEN (HIV 1/2 AB+AG)
HIV 1/2 ANTIBODIES: NONREACTIVE
HIV-1 P24 ANTIGEN - HIV24: NONREACTIVE
INTERPRETATION (HIV AG AB): NONREACTIVE

## 2017-05-08 LAB — I-STAT CG4 LACTIC ACID, ED
LACTIC ACID, VENOUS: 1.33 mmol/L (ref 0.5–1.9)
Lactic Acid, Venous: 0.59 mmol/L (ref 0.5–1.9)

## 2017-05-08 MED ORDER — DEXTROSE 5 % IV SOLN
2.0000 g | Freq: Once | INTRAVENOUS | Status: AC
  Administered 2017-05-08: 2 g via INTRAVENOUS
  Filled 2017-05-08: qty 2

## 2017-05-08 MED ORDER — ACETAMINOPHEN 500 MG PO TABS
1000.0000 mg | ORAL_TABLET | Freq: Once | ORAL | Status: AC
  Administered 2017-05-08: 1000 mg via ORAL
  Filled 2017-05-08: qty 2

## 2017-05-08 MED ORDER — CEPHALEXIN 500 MG PO CAPS: 500.0000 mg | ORAL_CAPSULE | Freq: Four times a day (QID) | ORAL | 0 refills | Status: DC

## 2017-05-08 MED ORDER — SODIUM CHLORIDE 0.9 % IV BOLUS (SEPSIS)
1000.0000 mL | Freq: Once | INTRAVENOUS | Status: AC
  Administered 2017-05-08: 1000 mL via INTRAVENOUS

## 2017-05-08 MED ORDER — METOCLOPRAMIDE HCL 5 MG/ML IJ SOLN
10.0000 mg | Freq: Once | INTRAMUSCULAR | Status: AC
  Administered 2017-05-08: 10 mg via INTRAVENOUS
  Filled 2017-05-08: qty 2

## 2017-05-08 MED ORDER — ONDANSETRON 4 MG PO TBDP: 4.0000 mg | ORAL_TABLET | Freq: Three times a day (TID) | ORAL | 0 refills | Status: DC | PRN

## 2017-05-08 MED ORDER — KETOROLAC TROMETHAMINE 15 MG/ML IJ SOLN
15.0000 mg | Freq: Once | INTRAMUSCULAR | Status: AC
  Administered 2017-05-08: 15 mg via INTRAVENOUS
  Filled 2017-05-08: qty 1

## 2017-05-08 MED ORDER — IOPAMIDOL (ISOVUE-300) INJECTION 61%
INTRAVENOUS | Status: AC
  Administered 2017-05-08: 100 mL
  Filled 2017-05-08: qty 100

## 2017-05-08 MED ORDER — DIPHENHYDRAMINE HCL 50 MG/ML IJ SOLN
25.0000 mg | Freq: Once | INTRAMUSCULAR | Status: AC
  Administered 2017-05-08: 25 mg via INTRAVENOUS
  Filled 2017-05-08: qty 1

## 2017-05-08 NOTE — ED Notes (Signed)
Pt stated feeling febrile. Oral temp 98.60F. Rn notified

## 2017-05-08 NOTE — ED Triage Notes (Signed)
Per pt, states she has been having fevers for 2 weeks-states unable to control fever "spikes" with OTC meds-states she also has a migraine

## 2017-05-08 NOTE — ED Provider Notes (Addendum)
[facility] EMERGENCY DEPARTMENT Provider Note   CSN: [id number] Arrival date & time: 05/08/17  1213     History   Chief Complaint Chief Complaint  Patient presents with  . Fever    HPI Krystal Shaffer is a 41 y.o. female.  HPI   41 yo F with PMHx migraines here with fever x 2 weeks. Pt states that for the past 2 weeks, she has been spiking intermittent fevers to 102. She has associated chills, nausea, and intermittent vomiting. The only new sx she's noticed is that her urine smells more strongly than usual, "like onions," along with urinary frequency. She's been trying to drink gatorade as much as possible. She's had associated mucus in her stools and constipation followed by loose stools. No vaginal bleeding or discharge. No high risk sexual contacts. No h/o similar symptoms. No other medical complaints. No recent sick contacts. No specific alleviating factors. Of note, pt has h/o migraines and these have worsened x 2 weeks but denies any numbness, weakness, photophobia, neck pain or stiffness. No rashes.  Past Medical History:  Diagnosis Date  . Migraine   . Migraines     There are no active problems to display for this patient.   No past surgical history on file.  OB History    No data available       Home Medications    Prior to Admission medications   Medication Sig Start Date End Date Taking? Authorizing Provider  baclofen (LIORESAL) 10 MG tablet Take 10 mg by mouth 2 (two) times daily as needed for muscle spasms.    [provider]  cephALEXin (KEFLEX) 500 MG capsule Take 1 capsule (500 mg total) by mouth 4 (four) times daily for 10 days. 05/08/17 05/18/17  Duffy Bruce, MD  eletriptan (RELPAX) 40 MG tablet Take 40 mg by mouth as needed for migraine or headache. One tablet by mouth at onset of headache. May repeat in 2 hours if headache persists or recurs.    [provider]  levofloxacin (LEVAQUIN) 500 MG tablet Take 500  mg by mouth daily. For 10 days.    [provider]  lubiprostone (AMITIZA) 24 MCG capsule Take 24 mcg by mouth 2 (two) times daily with a meal. For 4 days.    [provider]  ondansetron (ZOFRAN ODT) 4 MG disintegrating tablet Take 1 tablet (4 mg total) by mouth every 8 (eight) hours as needed for nausea or vomiting. 05/08/17   Duffy Bruce, MD  promethazine-dextromethorphan (PROMETHAZINE-DM) 6.25-15 MG/5ML syrup Take 5 mLs by mouth every 6 (six) hours as needed for cough.    [provider]  zonisamide (ZONEGRAN) 25 MG capsule Take 25 mg by mouth 4 (four) times daily. Migraine prevention.    [provider]    Family History No family history on file.  Social History Social History   Tobacco Use  . Smoking status: Never Smoker  . Smokeless tobacco: Never Used  Substance Use Topics  . Alcohol use: No  . Drug use: No     Allergies   Patient has no known allergies.   Review of Systems Review of Systems  Constitutional: Positive for chills, fatigue and fever.  Respiratory: Positive for cough.   Gastrointestinal: Positive for abdominal pain, diarrhea, nausea and vomiting.  Neurological: Positive for weakness.  All other systems reviewed and are negative.    Physical Exam Updated Vital Signs BP (!) 155/85   Pulse 81   Temp 98.7 F (37.1  C) (Oral)   Resp 18   Ht 5\' 4"  (1.626 m)   Wt 68.9 kg (152 lb)   LMP 04/24/2017 Comment: neg preg test  SpO2 100%   BMI 26.09 kg/m   Physical Exam  Constitutional: She is oriented to person, place, and time. She appears well-developed and well-nourished. No distress.  HENT:  Head: Normocephalic and atraumatic.  Mildly dry MM  Eyes: Conjunctivae are normal.  Neck: Neck supple.  Cardiovascular: Normal rate, regular rhythm and normal heart sounds. Exam reveals no friction rub.  No murmur heard. Pulmonary/Chest: Effort normal and breath sounds normal. No respiratory distress. She has no wheezes.  She has no rales.  Abdominal: Soft. Bowel sounds are normal. She exhibits no distension. There is tenderness (mild, RLQ>LLQ).  No overt CVAT  Musculoskeletal: She exhibits no edema.  Neurological: She is alert and oriented to person, place, and time. She exhibits normal muscle tone.  Skin: Skin is warm. Capillary refill takes less than 2 seconds.  Psychiatric: She has a normal mood and affect.  Nursing note and vitals reviewed.    ED Treatments / Results  Labs (all labs ordered are listed, but only abnormal results are displayed) Labs Reviewed  COMPREHENSIVE METABOLIC PANEL - Abnormal; Notable for the following components:      Result Value   BUN <5 (*)    Calcium 8.5 (*)    Albumin 2.8 (*)    AST 14 (*)    ALT 11 (*)    All other components within normal limits  CBC WITH DIFFERENTIAL/PLATELET - Abnormal; Notable for the following components:   Hemoglobin 10.7 (*)    HCT 33.3 (*)    All other components within normal limits  URINALYSIS, ROUTINE W REFLEX MICROSCOPIC - Abnormal; Notable for the following components:   APPearance HAZY (*)    Hgb urine dipstick SMALL (*)    Bilirubin Urine SMALL (*)    Protein, ur 30 (*)    Nitrite POSITIVE (*)    Bacteria, UA RARE (*)    All other components within normal limits  URINE CULTURE  RAPID HIV SCREEN (HIV 1/2 AB+AG)  I-STAT CG4 LACTIC ACID, ED  I-STAT BETA HCG BLOOD, ED ([facility], [facility], [facility] ONLY)  I-STAT CG4 LACTIC ACID, ED    EKG  EKG Interpretation  Date/Time:  Tuesday May 08 2017 16:17:57 EST Ventricular Rate:  97 PR Interval:    QRS Duration: 92 QT Interval:  377 QTC Calculation: 479 R Axis:   36 Text Interpretation:  Sinus rhythm Borderline low voltage, extremity leads No significant change since last tracing Confirmed by Duffy Bruce [phone]) on 05/09/2017 1:21:33 AM       Radiology Dg Chest 2 View  Result Date: 05/08/2017 CLINICAL DATA:  Initial evaluation for acute fever, dry cough, shortness of breath. EXAM:  CHEST  2 VIEW COMPARISON:  None available. FINDINGS: The cardiac and mediastinal silhouettes are within normal limits. The lungs are normally inflated. No airspace consolidation, pleural effusion, or pulmonary edema is identified. There is no pneumothorax. No acute osseous abnormality identified. IMPRESSION: No radiographic evidence for active cardiopulmonary disease. Electronically Signed   By: Jeannine Boga M.D.   On: 05/08/2017 13:21   Ct Abdomen Pelvis W Contrast  Result Date: 05/08/2017 CLINICAL DATA:  Lower pelvic pain was foul-smelling urine, fever, and chills for 2-3 days, nausea, vomiting EXAM: CT ABDOMEN AND PELVIS WITH CONTRAST TECHNIQUE: Multidetector CT imaging of the abdomen and pelvis was performed using the standard protocol following bolus administration  of intravenous contrast. Sagittal and coronal MPR images reconstructed from axial data set. CONTRAST:  141mL ISOVUE-300 IOPAMIDOL (ISOVUE-300) INJECTION 61% IV. No oral contrast administered. COMPARISON:  None FINDINGS: Lower chest: Lung bases clear Hepatobiliary: Mild focal fatty infiltration of liver adjacent to falciform fissure. Gallbladder and liver otherwise normal appearance. Pancreas: Normal appearance Spleen: Normal appearance Adrenals/Urinary Tract: Adrenal glands, kidneys, ureters, and bladder normal appearance Stomach/Bowel: Normal appendix. Stomach and bowel loops grossly unremarkable for exam lacking GI contrast and adequate distention. Vascular/Lymphatic: Normal appearance Reproductive: Uterus appears enlarged and contains a hypervascular mass at the RIGHT lateral aspect 4.1 x 3.6 x 4.4 cm question leiomyoma. Prominent cervix. Ovaries unremarkable. Other: No free air or free fluid.  No hernia. Musculoskeletal: Bones unremarkable. IMPRESSION: Suspected 4.4 cm diameter leiomyoma within uterus. No acute intra-abdominal or intrapelvic abnormalities. Electronically Signed   By: Lavonia Dana M.D.   On: 05/08/2017 17:52     Procedures Procedures (including critical care time)  Medications Ordered in ED Medications  sodium chloride 0.9 % bolus 1,000 mL (0 mLs Intravenous Stopped 05/08/17 1717)  metoCLOPramide (REGLAN) injection 10 mg (10 mg Intravenous Given 05/08/17 1602)  diphenhydrAMINE (BENADRYL) injection 25 mg (25 mg Intravenous Given 05/08/17 1602)  ketorolac (TORADOL) 15 MG/ML injection 15 mg (15 mg Intravenous Given 05/08/17 1603)  acetaminophen (TYLENOL) tablet 1,000 mg (1,000 mg Oral Given 05/08/17 1605)  cefTRIAXone (ROCEPHIN) 2 g in dextrose 5 % 50 mL IVPB (0 g Intravenous Stopped 05/08/17 1634)  iopamidol (ISOVUE-300) 61 % injection (100 mLs  Contrast Given 05/08/17 1630)     Initial Impression / Assessment and Plan / ED Course  I have reviewed the triage vital signs and the nursing notes.  Pertinent labs & imaging results that were available during my care of the patient were reviewed by me and considered in my medical decision making (see chart for details).     41 yo F with PMhx as above here with fevers, urinary frequency, foul-smelling urine x 2 weeks. UA does have pos nitrites, hematuria, and bacteria but no WBCs. Given duration of sx and GI sx as well, CT scan obtained to eval of renal abscess, diverticulitis, appendicitis, and is negative for acute abnormality. Pt feels improved with IVF and tx of her symptoms. Will treat her for pyelo, d/c with good return precautions. Pt updated and in agreement. She remains HDS throughout her ED stay.  Final Clinical Impressions(s) / ED Diagnoses   Final diagnoses:  Pyelonephritis  Uterine leiomyoma, unspecified location    ED Discharge Orders        Ordered    cephALEXin (KEFLEX) 500 MG capsule  4 times daily     05/08/17 1854    ondansetron (ZOFRAN ODT) 4 MG disintegrating tablet  Every 8 hours PRN     05/08/17 1854       Duffy Bruce, MD 05/09/17 Corlis Leak    Duffy Bruce, MD 05/09/17 0121

## 2017-05-08 NOTE — ED Triage Notes (Addendum)
Off and on fevers x  2  Weeks and cannot control them with meds, urine has funny odor onions smell she states. Went to  [facility] today but left there and came here denies dysuria except smell , denies vag d/c , has had a dry cough  , states has mucus in her poop

## 2017-05-08 NOTE — ED Notes (Signed)
Patient given discharge instructions and verbalized understanding.  Patient stable to discharge at this time.  Patient is alert and oriented to baseline.  No distressed noted at this time.  All belongings taken with the patient at discharge.   

## 2017-05-08 NOTE — ED Notes (Signed)
Patient left and went to [facility]

## 2017-05-10 LAB — URINE CULTURE
Culture: >=100000 — AB
Special Requests: NORMAL

## 2017-05-11 ENCOUNTER — Telehealth: Payer: Self-pay | Admitting: *Deleted

## 2017-05-11 NOTE — Telephone Encounter (Signed)
Post ED Visit - Positive Culture Follow-up  Culture report reviewed by antimicrobial stewardship pharmacist:  []  Elenor Quinones, Pharm.D. []  Heide Guile, Pharm.D., BCPS AQ-ID []  Parks Neptune, Pharm.D., BCPS []  Alycia Rossetti, Pharm.D., BCPS []  Abbott, Pharm.D., BCPS, AAHIVP []  Legrand Como, Pharm.D., BCPS, AAHIVP []  Salome Arnt, PharmD, BCPS []  Dimitri Ped, PharmD, BCPS []  Vincenza Hews, PharmD, BCPS Ulanda Edison, PharmD  Positive urine culture Treated with Cephalexin, organism sensitive to the same and no further patient follow-up is required at this time.  Harlon Flor [facility] 05/11/2017, 11:44 AM

## 2017-05-12 DIAGNOSIS — N3090 Cystitis, unspecified without hematuria: Secondary | ICD-10-CM

## 2017-05-12 HISTORY — DX: Cystitis, unspecified without hematuria: N30.90

## 2017-05-16 ENCOUNTER — Other Ambulatory Visit: Payer: Self-pay

## 2017-05-16 ENCOUNTER — Encounter (HOSPITAL_COMMUNITY): Payer: Self-pay

## 2017-05-16 DIAGNOSIS — R1110 Vomiting, unspecified: Secondary | ICD-10-CM

## 2017-05-16 DIAGNOSIS — R509 Fever, unspecified: Secondary | ICD-10-CM | POA: Insufficient documentation

## 2017-05-16 DIAGNOSIS — N10 Acute tubulo-interstitial nephritis: Secondary | ICD-10-CM | POA: Diagnosis not present

## 2017-05-16 DIAGNOSIS — N3001 Acute cystitis with hematuria: Secondary | ICD-10-CM | POA: Diagnosis not present

## 2017-05-16 DIAGNOSIS — Z5321 Procedure and treatment not carried out due to patient leaving prior to being seen by health care provider: Secondary | ICD-10-CM | POA: Insufficient documentation

## 2017-05-16 LAB — URINALYSIS, ROUTINE W REFLEX MICROSCOPIC
Bacteria, UA: NONE SEEN
GLUCOSE, UA: NEGATIVE mg/dL
Ketones, ur: NEGATIVE mg/dL
Nitrite: NEGATIVE
PH: 5 (ref 5.0–8.0)
Protein, ur: 30 mg/dL — AB
SPECIFIC GRAVITY, URINE: 1.031 — AB (ref 1.005–1.030)
SQUAMOUS EPITHELIAL / LPF: NONE SEEN

## 2017-05-16 LAB — COMPREHENSIVE METABOLIC PANEL
ALK PHOS: 66 U/L (ref 38–126)
ALT: 9 U/L — AB (ref 14–54)
AST: 12 U/L — AB (ref 15–41)
Albumin: 2.6 g/dL — ABNORMAL LOW (ref 3.5–5.0)
Anion gap: 9 (ref 5–15)
BUN: 6 mg/dL (ref 6–20)
CALCIUM: 8.4 mg/dL — AB (ref 8.9–10.3)
CHLORIDE: 103 mmol/L (ref 101–111)
CO2: 23 mmol/L (ref 22–32)
CREATININE: 0.65 mg/dL (ref 0.44–1.00)
Glucose, Bld: 95 mg/dL (ref 65–99)
Potassium: 3.3 mmol/L — ABNORMAL LOW (ref 3.5–5.1)
Sodium: 135 mmol/L (ref 135–145)
Total Bilirubin: 0.9 mg/dL (ref 0.3–1.2)
Total Protein: 7.5 g/dL (ref 6.5–8.1)

## 2017-05-16 LAB — CBC
HCT: 31.2 % — ABNORMAL LOW (ref 36.0–46.0)
Hemoglobin: 10 g/dL — ABNORMAL LOW (ref 12.0–15.0)
MCH: 25.8 pg — AB (ref 26.0–34.0)
MCHC: 32.1 g/dL (ref 30.0–36.0)
MCV: 80.4 fL (ref 78.0–100.0)
PLATELETS: 254 10*3/uL (ref 150–400)
RBC: 3.88 MIL/uL (ref 3.87–5.11)
RDW: 14 % (ref 11.5–15.5)
WBC: 12.4 10*3/uL — AB (ref 4.0–10.5)

## 2017-05-16 LAB — I-STAT BETA HCG BLOOD, ED (MC, WL, AP ONLY): I-stat hCG, quantitative: <5 m[IU]/mL (ref <5)

## 2017-05-16 LAB — LIPASE, BLOOD: LIPASE: 28 U/L (ref 11–51)

## 2017-05-16 NOTE — ED Triage Notes (Signed)
Pt states that for the past week she has had a fever, seen last week and told she had a bladder infection, denies dysuria. C/o of headache and pain in both of her legs, fatigue and vomiting x 3 today.

## 2017-05-17 ENCOUNTER — Emergency Department (HOSPITAL_COMMUNITY)
Admission: EM | Admit: 2017-05-17 | Discharge: 2017-05-17 | Disposition: A | Discharge status: Home / Self Care | Payer: 59 | Source: Home / Self Care

## 2017-05-17 ENCOUNTER — Inpatient Hospital Stay (HOSPITAL_COMMUNITY)
Admission: EM | Admit: 2017-05-17 | Discharge: 2017-05-19 | DRG: 690 | Disposition: A | Discharge status: Home / Self Care | Payer: 59 | Attending: Internal Medicine | Admitting: Internal Medicine

## 2017-05-17 ENCOUNTER — Encounter (HOSPITAL_COMMUNITY): Payer: Self-pay

## 2017-05-17 ENCOUNTER — Other Ambulatory Visit: Payer: Self-pay

## 2017-05-17 DIAGNOSIS — N390 Urinary tract infection, site not specified: Secondary | ICD-10-CM

## 2017-05-17 DIAGNOSIS — N3001 Acute cystitis with hematuria: Secondary | ICD-10-CM

## 2017-05-17 DIAGNOSIS — G43909 Migraine, unspecified, not intractable, without status migrainosus: Secondary | ICD-10-CM | POA: Diagnosis present

## 2017-05-17 DIAGNOSIS — N760 Acute vaginitis: Secondary | ICD-10-CM | POA: Diagnosis present

## 2017-05-17 DIAGNOSIS — R112 Nausea with vomiting, unspecified: Secondary | ICD-10-CM | POA: Diagnosis present

## 2017-05-17 DIAGNOSIS — M549 Dorsalgia, unspecified: Secondary | ICD-10-CM | POA: Diagnosis present

## 2017-05-17 DIAGNOSIS — N10 Acute tubulo-interstitial nephritis: Principal | ICD-10-CM | POA: Diagnosis present

## 2017-05-17 DIAGNOSIS — G8929 Other chronic pain: Secondary | ICD-10-CM | POA: Diagnosis present

## 2017-05-17 HISTORY — DX: Cystitis, unspecified without hematuria: N30.90

## 2017-05-17 HISTORY — DX: Family history of other specified conditions: Z84.89

## 2017-05-17 LAB — COMPREHENSIVE METABOLIC PANEL
ALBUMIN: 2.6 g/dL — AB (ref 3.5–5.0)
ALK PHOS: 65 U/L (ref 38–126)
ALT: 9 U/L — ABNORMAL LOW (ref 14–54)
ANION GAP: 9 (ref 5–15)
AST: 12 U/L — AB (ref 15–41)
BUN: 8 mg/dL (ref 6–20)
CO2: 23 mmol/L (ref 22–32)
Calcium: 8.2 mg/dL — ABNORMAL LOW (ref 8.9–10.3)
Chloride: 101 mmol/L (ref 101–111)
Creatinine, Ser: 0.69 mg/dL (ref 0.44–1.00)
GFR calc Af Amer: >60 mL/min (ref >60)
GFR calc non Af Amer: >60 mL/min (ref >60)
GLUCOSE: 97 mg/dL (ref 65–99)
POTASSIUM: 3.6 mmol/L (ref 3.5–5.1)
SODIUM: 133 mmol/L — AB (ref 135–145)
Total Bilirubin: 0.3 mg/dL (ref 0.3–1.2)
Total Protein: 7 g/dL (ref 6.5–8.1)

## 2017-05-17 LAB — URINALYSIS, ROUTINE W REFLEX MICROSCOPIC
BACTERIA UA: NONE SEEN
Glucose, UA: NEGATIVE mg/dL
KETONES UR: NEGATIVE mg/dL
Nitrite: NEGATIVE
PROTEIN: 30 mg/dL — AB
Specific Gravity, Urine: 1.025 (ref 1.005–1.030)
pH: 6 (ref 5.0–8.0)

## 2017-05-17 LAB — CBC WITH DIFFERENTIAL/PLATELET
Basophils Absolute: 0 10*3/uL (ref 0.0–0.1)
Basophils Relative: 0 %
EOS ABS: 0.1 10*3/uL (ref 0.0–0.7)
EOS PCT: 1 %
HCT: 29.4 % — ABNORMAL LOW (ref 36.0–46.0)
Hemoglobin: 9.3 g/dL — ABNORMAL LOW (ref 12.0–15.0)
LYMPHS ABS: 1.9 10*3/uL (ref 0.7–4.0)
Lymphocytes Relative: 23 %
MCH: 26 pg (ref 26.0–34.0)
MCHC: 31.6 g/dL (ref 30.0–36.0)
MCV: 82.1 fL (ref 78.0–100.0)
MONO ABS: 0.5 10*3/uL (ref 0.1–1.0)
Monocytes Relative: 6 %
Neutro Abs: 6 10*3/uL (ref 1.7–7.7)
Neutrophils Relative %: 70 %
Platelets: 238 10*3/uL (ref 150–400)
RBC: 3.58 MIL/uL — AB (ref 3.87–5.11)
RDW: 14.4 % (ref 11.5–15.5)
WBC: 8.5 10*3/uL (ref 4.0–10.5)

## 2017-05-17 LAB — WET PREP, GENITAL
SPERM: NONE SEEN
Trich, Wet Prep: NONE SEEN
YEAST WET PREP: NONE SEEN

## 2017-05-17 LAB — I-STAT CG4 LACTIC ACID, ED
LACTIC ACID, VENOUS: 1.05 mmol/L (ref 0.5–1.9)
Lactic Acid, Venous: 0.92 mmol/L (ref 0.5–1.9)

## 2017-05-17 LAB — SEDIMENTATION RATE: SED RATE: 76 mm/h — AB (ref 0–22)

## 2017-05-17 LAB — C-REACTIVE PROTEIN: CRP: 13.7 mg/dL — AB (ref <1.0)

## 2017-05-17 MED ORDER — MORPHINE SULFATE (PF) 4 MG/ML IV SOLN
4.0000 mg | Freq: Once | INTRAVENOUS | Status: AC
  Administered 2017-05-17: 4 mg via INTRAVENOUS
  Filled 2017-05-17: qty 1

## 2017-05-17 MED ORDER — DEXTROSE 5 % IV SOLN
1.0000 g | Freq: Once | INTRAVENOUS | Status: AC
  Administered 2017-05-17: 1 g via INTRAVENOUS
  Filled 2017-05-17: qty 10

## 2017-05-17 MED ORDER — METOCLOPRAMIDE HCL 5 MG/ML IJ SOLN
10.0000 mg | Freq: Once | INTRAMUSCULAR | Status: AC
  Administered 2017-05-17: 10 mg via INTRAVENOUS
  Filled 2017-05-17: qty 2

## 2017-05-17 MED ORDER — FLUCONAZOLE 150 MG PO TABS
150.0000 mg | ORAL_TABLET | Freq: Once | ORAL | Status: AC
  Administered 2017-05-18: 150 mg via ORAL
  Filled 2017-05-17: qty 1

## 2017-05-17 MED ORDER — SODIUM CHLORIDE 0.9 % IV BOLUS (SEPSIS)
1000.0000 mL | Freq: Once | INTRAVENOUS | Status: AC
  Administered 2017-05-17: 1000 mL via INTRAVENOUS

## 2017-05-17 MED ORDER — DIPHENHYDRAMINE HCL 50 MG/ML IJ SOLN
25.0000 mg | Freq: Once | INTRAMUSCULAR | Status: AC
  Administered 2017-05-17: 25 mg via INTRAVENOUS
  Filled 2017-05-17: qty 1

## 2017-05-17 NOTE — ED Notes (Signed)
Yelled for patient X 3

## 2017-05-17 NOTE — ED Notes (Signed)
Pt not found in lobby, presumed to have left without being seen.

## 2017-05-17 NOTE — ED Provider Notes (Signed)
[facility] Provider Note   CSN: [id number] Arrival date & time: 05/17/17  1320     History   Chief Complaint Chief Complaint  Patient presents with  . multiple complaints    HPI Krystal Shaffer is a 41 y.o. female.  HPI   42 year old female with past medical history migraines here with ongoing nausea, vomiting, and urinary symptoms.  The patient was just seen by myself approximately 1 week ago.  She had foul-smelling urine and back pain at that time.  She was given Keflex and sent home.  Urine culture did grow E. coli.  She states that since then, she had transient improvement in her symptoms and she was able to take her antibiotics.  However, over the last several days, the patient has had recurrence of urinary symptoms with now flank pain, nausea, and vomiting.  She has been trying to take her antibiotic and has been unable to keep it down.  She has associated vaginal itching but no overt vaginal discharge or pain.  No fevers today but has been having fevers up to 102-10 3 at night.  She has associated nausea and night sweats.  Her back pain is similar to her chronic back pain, only worse.  No lower extremity numbness or weakness.  No history of recurrent UTIs.  No other medical complaints.  Past Medical History:  Diagnosis Date  . Migraine   . Migraines     Patient Active Problem List   Diagnosis Date Noted  . Acute lower UTI 05/18/2017  . Back pain 05/18/2017  . Nausea and vomiting 05/18/2017  . UTI (urinary tract infection) 05/18/2017    History reviewed. No pertinent surgical history.  OB History    No data available       Home Medications    Prior to Admission medications   Medication Sig Start Date End Date Taking? Authorizing Provider  aspirin-acetaminophen-caffeine (EXCEDRIN MIGRAINE) [phone] MG tablet Take 1-2 tablets by mouth every 6 (six) hours as needed for headache.   Yes [provider]  ondansetron  (ZOFRAN ODT) 4 MG disintegrating tablet Take 1 tablet (4 mg total) by mouth every 8 (eight) hours as needed for nausea or vomiting. 05/08/17  Yes Duffy Bruce, MD    Family History History reviewed. No pertinent family history.  Social History Social History   Tobacco Use  . Smoking status: Never Smoker  . Smokeless tobacco: Never Used  Substance Use Topics  . Alcohol use: No  . Drug use: No     Allergies   Patient has no known allergies.   Review of Systems Review of Systems  Constitutional: Positive for fatigue and fever.  Gastrointestinal: Positive for nausea and vomiting.  All other systems reviewed and are negative.    Physical Exam Updated Vital Signs BP (!) 170/87   Pulse 98   Temp 98.3 F (36.8 C) (Oral)   Resp 20   Ht 5\' 4"  (1.626 m)   Wt 68.9 kg (152 lb)   LMP 04/24/2017 (Exact Date) Comment: neg preg test  SpO2 94%   BMI 26.09 kg/m   Physical Exam  Constitutional: She is oriented to person, place, and time. She appears well-developed and well-nourished. No distress.  HENT:  Head: Normocephalic and atraumatic.  Eyes: Conjunctivae are normal.  Neck: Neck supple.  Cardiovascular: Normal rate, regular rhythm and normal heart sounds. Exam reveals no friction rub.  No murmur heard. Pulmonary/Chest: Effort normal and breath sounds normal. No respiratory distress.  She has no wheezes. She has no rales.  Abdominal: Soft. She exhibits no distension. There is tenderness (Mild, suprapubic).  Genitourinary:  Genitourinary Comments: White, thick vaginal discharge.  No cervical motion tenderness.  No adnexal tenderness.  Musculoskeletal: She exhibits no edema.  Mild midline lower back pain and bilateral flank pain.  Neurological: She is alert and oriented to person, place, and time. She exhibits normal muscle tone.  Skin: Skin is warm. Capillary refill takes less than 2 seconds.  Psychiatric: She has a normal mood and affect.  Nursing note and vitals  reviewed.    ED Treatments / Results  Labs (all labs ordered are listed, but only abnormal results are displayed) Labs Reviewed  WET PREP, GENITAL - Abnormal; Notable for the following components:      Result Value   Clue Cells Wet Prep HPF POC PRESENT (*)    WBC, Wet Prep HPF POC FEW (*)    All other components within normal limits  CBC WITH DIFFERENTIAL/PLATELET - Abnormal; Notable for the following components:   RBC 3.58 (*)    Hemoglobin 9.3 (*)    HCT 29.4 (*)    All other components within normal limits  COMPREHENSIVE METABOLIC PANEL - Abnormal; Notable for the following components:   Sodium 133 (*)    Calcium 8.2 (*)    Albumin 2.6 (*)    AST 12 (*)    ALT 9 (*)    All other components within normal limits  URINALYSIS, ROUTINE W REFLEX MICROSCOPIC - Abnormal; Notable for the following components:   Color, Urine AMBER (*)    APPearance HAZY (*)    Hgb urine dipstick MODERATE (*)    Bilirubin Urine SMALL (*)    Protein, ur 30 (*)    Leukocytes, UA SMALL (*)    Squamous Epithelial / LPF 0-5 (*)    All other components within normal limits  SEDIMENTATION RATE - Abnormal; Notable for the following components:   Sed Rate 76 (*)    All other components within normal limits  C-REACTIVE PROTEIN - Abnormal; Notable for the following components:   CRP 13.7 (*)    All other components within normal limits  CULTURE, BLOOD (ROUTINE X 2)  CULTURE, BLOOD (ROUTINE X 2)  URINE CULTURE  INFLUENZA PANEL BY PCR (TYPE A & B)  I-STAT CG4 LACTIC ACID, ED  I-STAT CG4 LACTIC ACID, ED  GC/CHLAMYDIA PROBE AMP ([place]) NOT AT [facility]    EKG  EKG Interpretation None       Radiology No results found.  Procedures Procedures (including critical care time)  Medications Ordered in ED Medications  ibuprofen (ADVIL,MOTRIN) tablet 800 mg (800 mg Oral Given 05/18/17 0033)  sodium chloride 0.9 % bolus 1,000 mL (0 mLs Intravenous Stopped 05/17/17 2055)  cefTRIAXone (ROCEPHIN) 1 g in  dextrose 5 % 50 mL IVPB (0 g Intravenous Stopped 05/17/17 2025)  metoCLOPramide (REGLAN) injection 10 mg (10 mg Intravenous Given 05/17/17 1955)  diphenhydrAMINE (BENADRYL) injection 25 mg (25 mg Intravenous Given 05/17/17 1955)  morphine 4 MG/ML injection 4 mg (4 mg Intravenous Given 05/17/17 1955)  fluconazole (DIFLUCAN) tablet 150 mg (150 mg Oral Given 05/18/17 0034)     Initial Impression / Assessment and Plan / ED Course  I have reviewed the triage vital signs and the nursing notes.  Pertinent labs & imaging results that were available during my care of the patient were reviewed by me and considered in my medical decision making (see chart for details).  41 year old female here with persistent fevers, nausea, and vomiting every time she tries to take antibiotics for recent UTI.  Patient is overall nontoxic appearing, but did have elevated leukocytosis yesterday on lab work obtained before she left without being seen.  White count appears improved today, but she has recurrence of significant hematuria.  Lactic acid is normal.  Her sed rate and CRP are markedly elevated consistent with ongoing infection, consistent with her recurrent fevers to 102-103 at home.  Given her persistent fevers, inability to tolerate her p.o. antibiotics, and undifferentiated nature of symptoms, will admit.  I suspect this is potentially secondary to UTI.  She may benefit from an inpatient renal and transvaginal ultrasound to evaluate for abscess or etiology for recurrent symptoms.  Regarding her back pain, she admits to chronic back pain.  She has no specific risk factors for epidural abscess, but if symptoms do not improve, may consider further imaging for assessment.  Final Clinical Impressions(s) / ED Diagnoses   Final diagnoses:  Acute cystitis with hematuria    ED Discharge Orders    None       Duffy Bruce, MD 05/18/17 [phone]

## 2017-05-17 NOTE — ED Triage Notes (Signed)
Pt endorses intermittent fever since being told that she had a bladder infection last week and is haiving bilateral lower leg pain and headaches.

## 2017-05-18 ENCOUNTER — Encounter (HOSPITAL_COMMUNITY): Payer: Self-pay | Admitting: General Practice

## 2017-05-18 ENCOUNTER — Other Ambulatory Visit: Payer: Self-pay

## 2017-05-18 ENCOUNTER — Observation Stay (HOSPITAL_COMMUNITY): Payer: 59

## 2017-05-18 DIAGNOSIS — M549 Dorsalgia, unspecified: Secondary | ICD-10-CM | POA: Diagnosis not present

## 2017-05-18 DIAGNOSIS — R112 Nausea with vomiting, unspecified: Secondary | ICD-10-CM | POA: Diagnosis present

## 2017-05-18 DIAGNOSIS — N10 Acute tubulo-interstitial nephritis: Secondary | ICD-10-CM | POA: Diagnosis present

## 2017-05-18 DIAGNOSIS — N3001 Acute cystitis with hematuria: Secondary | ICD-10-CM | POA: Diagnosis present

## 2017-05-18 DIAGNOSIS — G43909 Migraine, unspecified, not intractable, without status migrainosus: Secondary | ICD-10-CM | POA: Diagnosis present

## 2017-05-18 DIAGNOSIS — N760 Acute vaginitis: Secondary | ICD-10-CM | POA: Diagnosis present

## 2017-05-18 DIAGNOSIS — N390 Urinary tract infection, site not specified: Secondary | ICD-10-CM | POA: Diagnosis not present

## 2017-05-18 DIAGNOSIS — G8929 Other chronic pain: Secondary | ICD-10-CM | POA: Diagnosis present

## 2017-05-18 LAB — BASIC METABOLIC PANEL
Anion gap: 7 (ref 5–15)
BUN: 7 mg/dL (ref 6–20)
CHLORIDE: 102 mmol/L (ref 101–111)
CO2: 26 mmol/L (ref 22–32)
CREATININE: 0.65 mg/dL (ref 0.44–1.00)
Calcium: 8.3 mg/dL — ABNORMAL LOW (ref 8.9–10.3)
GFR calc Af Amer: >60 mL/min (ref >60)
GFR calc non Af Amer: >60 mL/min (ref >60)
GLUCOSE: 92 mg/dL (ref 65–99)
POTASSIUM: 3.4 mmol/L — AB (ref 3.5–5.1)
SODIUM: 135 mmol/L (ref 135–145)

## 2017-05-18 LAB — GC/CHLAMYDIA PROBE AMP (~~LOC~~) NOT AT ARMC
Chlamydia: NEGATIVE
Neisseria Gonorrhea: NEGATIVE

## 2017-05-18 LAB — CBC
HEMATOCRIT: 28.9 % — AB (ref 36.0–46.0)
Hemoglobin: 9.1 g/dL — ABNORMAL LOW (ref 12.0–15.0)
MCH: 25.7 pg — AB (ref 26.0–34.0)
MCHC: 31.5 g/dL (ref 30.0–36.0)
MCV: 81.6 fL (ref 78.0–100.0)
PLATELETS: 220 10*3/uL (ref 150–400)
RBC: 3.54 MIL/uL — ABNORMAL LOW (ref 3.87–5.11)
RDW: 14 % (ref 11.5–15.5)
WBC: 8.4 10*3/uL (ref 4.0–10.5)

## 2017-05-18 LAB — INFLUENZA PANEL BY PCR (TYPE A & B)
INFLBPCR: NEGATIVE
Influenza A By PCR: NEGATIVE

## 2017-05-18 MED ORDER — SODIUM CHLORIDE 0.9% FLUSH: 3.0000 mL | INTRAVENOUS | Status: DC | PRN

## 2017-05-18 MED ORDER — IBUPROFEN 200 MG PO TABS
800.0000 mg | ORAL_TABLET | Freq: Four times a day (QID) | ORAL | Status: DC | PRN
  Administered 2017-05-18 – 2017-05-19 (×5): 800 mg via ORAL
  Filled 2017-05-18: qty 1
  Filled 2017-05-18 (×4): qty 4

## 2017-05-18 MED ORDER — ONDANSETRON HCL 4 MG/2ML IJ SOLN: 4.0000 mg | Freq: Four times a day (QID) | INTRAMUSCULAR | Status: DC | PRN

## 2017-05-18 MED ORDER — SODIUM CHLORIDE 0.9 % IV SOLN: 250.0000 mL | INTRAVENOUS | Status: DC | PRN

## 2017-05-18 MED ORDER — ASPIRIN-ACETAMINOPHEN-CAFFEINE 250-250-65 MG PO TABS
1.0000 | ORAL_TABLET | Freq: Three times a day (TID) | ORAL | Status: DC | PRN
  Filled 2017-05-18: qty 1

## 2017-05-18 MED ORDER — METRONIDAZOLE 500 MG PO TABS
500.0000 mg | ORAL_TABLET | Freq: Two times a day (BID) | ORAL | Status: DC
  Administered 2017-05-18: 500 mg via ORAL
  Filled 2017-05-18: qty 1

## 2017-05-18 MED ORDER — POTASSIUM CHLORIDE CRYS ER 20 MEQ PO TBCR
40.0000 meq | EXTENDED_RELEASE_TABLET | Freq: Once | ORAL | Status: AC
  Administered 2017-05-18: 40 meq via ORAL
  Filled 2017-05-18: qty 2

## 2017-05-18 MED ORDER — ELETRIPTAN HYDROBROMIDE 20 MG PO TABS
20.0000 mg | ORAL_TABLET | Freq: Once | ORAL | Status: DC
  Filled 2017-05-18: qty 1

## 2017-05-18 MED ORDER — SODIUM CHLORIDE 0.9% FLUSH
3.0000 mL | Freq: Two times a day (BID) | INTRAVENOUS | Status: DC
  Administered 2017-05-18 – 2017-05-19 (×4): 3 mL via INTRAVENOUS

## 2017-05-18 MED ORDER — METRONIDAZOLE 0.75 % VA GEL
1.0000 | Freq: Every day | VAGINAL | Status: DC
  Administered 2017-05-18: 1 via VAGINAL
  Filled 2017-05-18: qty 70

## 2017-05-18 MED ORDER — ONDANSETRON HCL 4 MG PO TABS: 4.0000 mg | ORAL_TABLET | Freq: Four times a day (QID) | ORAL | Status: DC | PRN

## 2017-05-18 MED ORDER — DEXTROSE 5 % IV SOLN
1.0000 g | INTRAVENOUS | Status: DC
  Administered 2017-05-18: 1 g via INTRAVENOUS
  Filled 2017-05-18 (×2): qty 10

## 2017-05-18 NOTE — Progress Notes (Addendum)
  PROGRESS NOTE  Patient admitted earlier this morning. See H&P. Krystal Shaffer is a 41 yo female with medical history significant of recently diagnosed E Coli UTI. She has been taking Keflex (on day 5 of 10 day course). She admitted to initial improvement, however a few days ago started to feel worse.  She admits to nausea, vomiting, fevers up to 102.  She also admits to lower back pain, no dysuria, has thin non-malodorous vaginal discharge.   Patient has now failed outpatient PO antibiotics for E coli UTI (sensitive to cephalosporins). Also with bacterial vaginosis. Renal US reveals left nephromegaly which can be seen with pyelonephritis. She has systemic symptoms such as fever and low back pain, also has had nausea, vomiting. She did not eat much breakfast this morning.   Replace oral potassium and repeat lab in AM  Continue IV Rocephin and oral Flagyl Await blood culture and urine culture result, STI testing  Possible DC tomorrow if clinically stable and able to tolerate PO    [name], DO [facility] [email or web address] Password [email or web address] 05/18/2017, 11:53 AM

## 2017-05-18 NOTE — ED Notes (Signed)
hospitalist at bedside

## 2017-05-18 NOTE — H&P (Signed)
History and Physical    Naraya Stoneberg YCX:[id number] DOB: 1975-12-30 DOA: 05/17/2017  PCP: System, Pcp Not In  Patient coming from:  home  Chief Complaint: n/v  HPI: Glema Takaki is a 41 y.o. female with medical history significant of uti treated with keflex a week ago which she has completed over 5 days of a 10 day course.  Urine cx from that ED visit grew out [employer].  She also had a ct scan at that time which showed no complications.  Pt went home and felt better the first couple of days.  And then started to get worse about 2 days ago with a lot of nausea and vomiting unable to keep her abx down.  She reports a fever up to 102, but none here. She just ate a subway sandwich.  She reports lower back pain which is also new.  No flank pain.  No diarrhea.  Pt had pelvic done in ED which reportedly was normal.  Pt referred for admission for worsening symtpoms despite oral abx at home for the past week.  Review of Systems: As per HPI otherwise 10 point review of systems negative.   Past Medical History:  Diagnosis Date  . Migraine   . Migraines     History reviewed. No pertinent surgical history.   reports that  has never smoked. she has never used smokeless tobacco. She reports that she does not drink alcohol or use drugs.  No Known Allergies  History reviewed. No pertinent family history.no premature CAD  Prior to Admission medications   Medication Sig Start Date End Date Taking? Authorizing Provider  baclofen (LIORESAL) 10 MG tablet Take 10 mg by mouth 2 (two) times daily as needed for muscle spasms.    [provider]  cephALEXin (KEFLEX) 500 MG capsule Take 1 capsule (500 mg total) by mouth 4 (four) times daily for 10 days. 05/08/17 05/18/17  Duffy Bruce, MD  eletriptan (RELPAX) 40 MG tablet Take 40 mg by mouth as needed for migraine or headache. One tablet by mouth at onset of headache. May repeat in 2 hours if headache persists or recurs.    [provider]  levofloxacin (LEVAQUIN) 500 MG tablet Take 500 mg by mouth daily. For 10 days.    [provider]  lubiprostone (AMITIZA) 24 MCG capsule Take 24 mcg by mouth 2 (two) times daily with a meal. For 4 days.    [provider]  ondansetron (ZOFRAN ODT) 4 MG disintegrating tablet Take 1 tablet (4 mg total) by mouth every 8 (eight) hours as needed for nausea or vomiting. 05/08/17   Duffy Bruce, MD  promethazine-dextromethorphan (PROMETHAZINE-DM) 6.25-15 MG/5ML syrup Take 5 mLs by mouth every 6 (six) hours as needed for cough.    [provider]  zonisamide (ZONEGRAN) 25 MG capsule Take 25 mg by mouth 4 (four) times daily. Migraine prevention.    [provider]    Physical Exam: Vitals:   05/17/17 1328 05/17/17 1731 05/17/17 1845  BP: (!) 141/78 137/72 140/63  Pulse: 90 100 97  Resp: 16 17 20   Temp: 98.5 F (36.9 C)  98.3 F (36.8 C)  TempSrc: Oral  Oral  SpO2: 99% 100% 100%  Weight: 68.9 kg (152 lb)    Height: 5\' 4"  (1.626 m)      Constitutional: NAD, calm, comfortable Vitals:   05/17/17 1328 05/17/17 1731 05/17/17 1845  BP: (!) 141/78 137/72 140/63  Pulse: 90 100 97  Resp: 16 17 20  Temp: 98.5 F (36.9 C)  98.3 F (36.8 C)  TempSrc: Oral  Oral  SpO2: 99% 100% 100%  Weight: 68.9 kg (152 lb)    Height: 5\' 4"  (1.626 m)     Eyes: PERRL, lids and conjunctivae normal ENMT: Mucous membranes are moist. Posterior pharynx clear of any exudate or lesions.Normal dentition.  Neck: normal, supple, no masses, no thyromegaly Respiratory: clear to auscultation bilaterally, no wheezing, no crackles. Normal respiratory effort. No accessory muscle use.  Cardiovascular: Regular rate and rhythm, no murmurs / rubs / gallops. No extremity edema. 2+ pedal pulses. No carotid bruits.  Abdomen: no tenderness, no cva ttp no masses palpated. No hepatosplenomegaly. Bowel sounds positive.  Musculoskeletal: no clubbing / cyanosis. No joint deformity upper  and lower extremities. Good ROM, no contractures. Normal muscle tone.  Skin: no rashes, lesions, ulcers. No induration Neurologic: CN 2-12 grossly intact. Sensation intact, DTR normal. Strength 5/5 in all 4.  Psychiatric: Normal judgment and insight. Alert and oriented x 3. Normal mood.    Labs on Admission: I have personally reviewed following labs and imaging studies  CBC: Recent Labs  Lab 05/16/17 2040 05/17/17 2055  WBC 12.4* 8.5  NEUTROABS  --  6.0  HGB 10.0* 9.3*  HCT 31.2* 29.4*  MCV 80.4 82.1  PLT 254 169   Basic Metabolic Panel: Recent Labs  Lab 05/16/17 2040 05/17/17 2055  NA 135 133*  K 3.3* 3.6  CL 103 101  CO2 23 23  GLUCOSE 95 97  BUN 6 8  CREATININE 0.65 0.69  CALCIUM 8.4* 8.2*   GFR: Estimated Creatinine Clearance: 89.1 mL/min (by C-G formula based on SCr of 0.69 mg/dL). Liver Function Tests: Recent Labs  Lab 05/16/17 2040 05/17/17 2055  AST 12* 12*  ALT 9* 9*  ALKPHOS 66 65  BILITOT 0.9 0.3  PROT 7.5 7.0  ALBUMIN 2.6* 2.6*   Recent Labs  Lab 05/16/17 2040  LIPASE 28   No results for input(s): AMMONIA in the last 168 hours. Coagulation Profile: No results for input(s): INR, PROTIME in the last 168 hours. Cardiac Enzymes: No results for input(s): CKTOTAL, CKMB, CKMBINDEX, TROPONINI in the last 168 hours. BNP (last 3 results) No results for input(s): PROBNP in the last 8760 hours. HbA1C: No results for input(s): HGBA1C in the last 72 hours. CBG: No results for input(s): GLUCAP in the last 168 hours. Lipid Profile: No results for input(s): CHOL, HDL, LDLCALC, TRIG, CHOLHDL, LDLDIRECT in the last 72 hours. Thyroid Function Tests: No results for input(s): TSH, T4TOTAL, FREET4, T3FREE, THYROIDAB in the last 72 hours. Anemia Panel: No results for input(s): VITAMINB12, FOLATE, FERRITIN, TIBC, IRON, RETICCTPCT in the last 72 hours. Urine analysis:    Component Value Date/Time   COLORURINE AMBER (A) 05/17/2017 2203   APPEARANCEUR HAZY  (A) 05/17/2017 2203   LABSPEC 1.025 05/17/2017 2203   PHURINE 6.0 05/17/2017 2203   GLUCOSEU NEGATIVE 05/17/2017 2203   HGBUR MODERATE (A) 05/17/2017 2203   BILIRUBINUR SMALL (A) 05/17/2017 2203   KETONESUR NEGATIVE 05/17/2017 2203   PROTEINUR 30 (A) 05/17/2017 2203   UROBILINOGEN 1.0 07/22/2013 1727   NITRITE NEGATIVE 05/17/2017 2203   LEUKOCYTESUR SMALL (A) 05/17/2017 2203   Sepsis Labs: !!!!!!!!!!!!!!!!!!!!!!!!!!!!!!!!!!!!!!!!!!!! @LABRCNTIP (procalcitonin:4,lacticidven:4) ) Recent Results (from the past 240 hour(s))  Urine culture     Status: Abnormal   Collection Time: 05/08/17  6:24 PM  Result Value Ref Range Status   Specimen Description URINE, CLEAN CATCH  Final   Special Requests Normal  Final  Culture >=100,000 COLONIES/mL ESCHERICHIA COLI (A)  Final   Report Status 05/10/2017 FINAL  Final   Organism ID, Bacteria ESCHERICHIA COLI (A)  Final      Susceptibility   Escherichia coli - MIC*    AMPICILLIN >=32 RESISTANT Resistant     CEFAZOLIN 16 SENSITIVE Sensitive     CEFTRIAXONE <=1 SENSITIVE Sensitive     CIPROFLOXACIN <=0.25 SENSITIVE Sensitive     GENTAMICIN <=1 SENSITIVE Sensitive     IMIPENEM <=0.25 SENSITIVE Sensitive     NITROFURANTOIN <=16 SENSITIVE Sensitive     TRIMETH/SULFA <=20 SENSITIVE Sensitive     AMPICILLIN/SULBACTAM >=32 RESISTANT Resistant     PIP/TAZO <=4 SENSITIVE Sensitive     Extended ESBL NEGATIVE Sensitive     * >=100,000 COLONIES/mL ESCHERICHIA COLI  Wet prep, genital     Status: Abnormal   Collection Time: 05/17/17 10:07 PM  Result Value Ref Range Status   Yeast Wet Prep HPF POC NONE SEEN NONE SEEN Final   Trich, Wet Prep NONE SEEN NONE SEEN Final   Clue Cells Wet Prep HPF POC PRESENT (A) NONE SEEN Final   WBC, Wet Prep HPF POC FEW (A) NONE SEEN Final   Sperm NONE SEEN  Final     Radiological Exams on Admission: No results found.  Old chart reviewed Case discussed with dr isaacs  Assessment/Plan 41 yo female with recent uti and  worsening symptoms unable to hold po meds down with report of fever  Principal Problem:   Acute lower UTI- iv rocephin.  Repeat urine cx.  Will obtain renal ultrasound for possible renal absess.  Ct last week was neg for hydro or stone.    Active Problems:   Back pain- noted, no cva ttp   Nausea and vomiting- zofran.  Appears to be eating now     DVT prophylaxis:  scds Code Status:  full Family Communication:  none  Disposition Plan:  Per day team Consults called:  none Admission status:  observation   Audrick Lamoureaux A MD [facility]  If 7PM-7AM, please contact night-coverage [email or web address] Password [email or web address]  05/18/2017, 12:06 AM

## 2017-05-18 NOTE — Plan of Care (Signed)
  Activity: Risk for activity intolerance will decrease 05/18/2017 1016 - Progressing by Williams Che, RN   Nutrition: Adequate nutrition will be maintained 05/18/2017 1016 - Progressing by Williams Che, RN   Coping: Level of anxiety will decrease 05/18/2017 1016 - Progressing by Williams Che, RN   Pain Managment: General experience of comfort will improve 05/18/2017 1016 - Progressing by Williams Che, RN

## 2017-05-19 LAB — CBC
HEMATOCRIT: 28.8 % — AB (ref 36.0–46.0)
HEMOGLOBIN: 9 g/dL — AB (ref 12.0–15.0)
MCH: 25.5 pg — ABNORMAL LOW (ref 26.0–34.0)
MCHC: 31.3 g/dL (ref 30.0–36.0)
MCV: 81.6 fL (ref 78.0–100.0)
Platelets: 250 10*3/uL (ref 150–400)
RBC: 3.53 MIL/uL — ABNORMAL LOW (ref 3.87–5.11)
RDW: 14.1 % (ref 11.5–15.5)
WBC: 7.4 10*3/uL (ref 4.0–10.5)

## 2017-05-19 LAB — BASIC METABOLIC PANEL
Anion gap: 7 (ref 5–15)
BUN: 12 mg/dL (ref 6–20)
CHLORIDE: 104 mmol/L (ref 101–111)
CO2: 26 mmol/L (ref 22–32)
CREATININE: 0.65 mg/dL (ref 0.44–1.00)
Calcium: 8.4 mg/dL — ABNORMAL LOW (ref 8.9–10.3)
GFR calc non Af Amer: >60 mL/min (ref >60)
GLUCOSE: 89 mg/dL (ref 65–99)
Potassium: 4.1 mmol/L (ref 3.5–5.1)
Sodium: 137 mmol/L (ref 135–145)

## 2017-05-19 LAB — MAGNESIUM: Magnesium: 1.9 mg/dL (ref 1.7–2.4)

## 2017-05-19 MED ORDER — METRONIDAZOLE 500 MG PO TABS: 500.0000 mg | ORAL_TABLET | Freq: Two times a day (BID) | ORAL | 0 refills | Status: DC

## 2017-05-19 MED ORDER — CIPROFLOXACIN HCL 500 MG PO TABS: 500.0000 mg | ORAL_TABLET | Freq: Two times a day (BID) | ORAL | 0 refills | Status: DC

## 2017-05-19 NOTE — Discharge Summary (Signed)
Physician Discharge Summary  Krystal Shaffer NFA:[id number] DOB: 06-03-1976 DOA: 05/17/2017  PCP: Sofie Rower, PA-C  Admit date: 05/17/2017 Discharge date: 05/19/2017  Admitted From: Home Disposition:  Home  Recommendations for Outpatient Follow-up:  1. Follow up with PCP in 1 week 2. Please follow up on the following pending results: final blood culture results, negative at time of discharge  Discharge Condition: Stable CODE STATUS: Full  Diet recommendation: Regular diet   Brief/Interim Summary: Krystal Shaffer is a 41 yo female with medical history significant of recently diagnosed E Coli UTI. She has been taking Keflex (on day 5 of 10 day course). She admitted to initial improvement, however a few days ago started to feel worse.  She admits to nausea, vomiting, fevers up to 102.  She also admits to lower back pain, no dysuria, has thin non-malodorous vaginal discharge. Patient has now failed outpatient PO antibiotics for E coli UTI (sensitive to cephalosporins). Also with bacterial vaginosis. Renal US reveals left nephromegaly which can be seen with pyelonephritis. She has systemic symptoms such as fever and low back pain, also has had nausea, vomiting.  Her nausea and vomiting improved during hospitalization. She was treated empirically with IV rocephin. Chlamydia and Gonorrhea was negative. Unfortunately, urine culture was not sent at time of admission. She is discharged with oral cipro for pyelonephritis and oral flagyl for BV. She was tolerating PO at time of discharge with no fevers.   Discharge Diagnoses:  Principal Problem:   Acute pyelonephritis Active Problems:   Back pain   Nausea and vomiting Migraine   Discharge Instructions  Discharge Instructions    Call MD for:  difficulty breathing, headache or visual disturbances   Complete by:  As directed    Call MD for:  extreme fatigue   Complete by:  As directed    Call MD for:  hives   Complete by:  As directed     Call MD for:  persistant dizziness or light-headedness   Complete by:  As directed    Call MD for:  persistant nausea and vomiting   Complete by:  As directed    Call MD for:  severe uncontrolled pain   Complete by:  As directed    Call MD for:  temperature >100.4   Complete by:  As directed    Discharge instructions   Complete by:  As directed    You were cared for by a hospitalist during your hospital stay. If you have any questions about your discharge medications or the care you received while you were in the hospital after you are discharged, you can call the unit and asked to speak with the hospitalist on call if the hospitalist that took care of you is not available. Once you are discharged, your primary care physician will handle any further medical issues. Please note that NO REFILLS for any discharge medications will be authorized once you are discharged, as it is imperative that you return to your primary care physician (or establish a relationship with a primary care physician if you do not have one) for your aftercare needs so that they can reassess your need for medications and monitor your lab values.   Increase activity slowly   Complete by:  As directed      Allergies as of 05/19/2017   No Known Allergies     Medication List    TAKE these medications   aspirin-acetaminophen-caffeine 250-250-65 MG tablet Commonly known as:  EXCEDRIN MIGRAINE Take 1-2 tablets by  mouth every 6 (six) hours as needed for headache.   ciprofloxacin 500 MG tablet Commonly known as:  CIPRO Take 1 tablet (500 mg total) by mouth 2 (two) times daily for 7 days.   metroNIDAZOLE 500 MG tablet Commonly known as:  FLAGYL Take 1 tablet (500 mg total) by mouth 2 (two) times daily for 7 days.   ondansetron 4 MG disintegrating tablet Commonly known as:  ZOFRAN ODT Take 1 tablet (4 mg total) by mouth every 8 (eight) hours as needed for nausea or vomiting.      Follow-up Information    [place],  O'Laf, PA-C. Schedule an appointment as soon as possible for a visit in 1 week(s).   Specialty:  Physician Assistant Contact information: [place] [address] [phone]          No Known Allergies  Consultations:  None   Procedures/Studies: Dg Chest 2 View  Result Date: 05/08/2017 CLINICAL DATA:  Initial evaluation for acute fever, dry cough, shortness of breath. EXAM: CHEST  2 VIEW COMPARISON:  None available. FINDINGS: The cardiac and mediastinal silhouettes are within normal limits. The lungs are normally inflated. No airspace consolidation, pleural effusion, or pulmonary edema is identified. There is no pneumothorax. No acute osseous abnormality identified. IMPRESSION: No radiographic evidence for active cardiopulmonary disease. Electronically Signed   By: Jeannine Boga M.D.   On: 05/08/2017 13:21   Ct Abdomen Pelvis W Contrast  Result Date: 05/08/2017 CLINICAL DATA:  Lower pelvic pain was foul-smelling urine, fever, and chills for 2-3 days, nausea, vomiting EXAM: CT ABDOMEN AND PELVIS WITH CONTRAST TECHNIQUE: Multidetector CT imaging of the abdomen and pelvis was performed using the standard protocol following bolus administration of intravenous contrast. Sagittal and coronal MPR images reconstructed from axial data set. CONTRAST:  187mL ISOVUE-300 IOPAMIDOL (ISOVUE-300) INJECTION 61% IV. No oral contrast administered. COMPARISON:  None FINDINGS: Lower chest: Lung bases clear Hepatobiliary: Mild focal fatty infiltration of liver adjacent to falciform fissure. Gallbladder and liver otherwise normal appearance. Pancreas: Normal appearance Spleen: Normal appearance Adrenals/Urinary Tract: Adrenal glands, kidneys, ureters, and bladder normal appearance Stomach/Bowel: Normal appendix. Stomach and bowel loops grossly unremarkable for exam lacking GI contrast and adequate distention. Vascular/Lymphatic: Normal appearance Reproductive: Uterus appears enlarged  and contains a hypervascular mass at the RIGHT lateral aspect 4.1 x 3.6 x 4.4 cm question leiomyoma. Prominent cervix. Ovaries unremarkable. Other: No free air or free fluid.  No hernia. Musculoskeletal: Bones unremarkable. IMPRESSION: Suspected 4.4 cm diameter leiomyoma within uterus. No acute intra-abdominal or intrapelvic abnormalities. Electronically Signed   By: Lavonia Dana M.D.   On: 05/08/2017 17:52   US Renal  Result Date: 05/18/2017 CLINICAL DATA:  Urinary tract infection EXAM: RENAL / URINARY TRACT ULTRASOUND COMPLETE COMPARISON:  Abdominal CT 05/08/2017 FINDINGS: Right Kidney: Length: 12 cm. Echogenicity within normal limits. No mass or hydronephrosis visualized. Left Kidney: Length: 13.5 cm. Echogenicity within normal limits. No mass or hydronephrosis visualized. Bladder: Appears normal for degree of bladder distention. Bilateral ureteral jet. Simple left ovarian cyst measuring 3.1 cm, new from recent CT and thus follicular. IMPRESSION: Left nephromegaly which is a nonspecific finding that can be seen with pyelonephritis in this setting. No hydronephrosis or abscess. Electronically Signed   By: Monte Fantasia M.D.   On: 05/18/2017 09:22      Discharge Exam: Vitals:   05/18/17 2109 05/19/17 0555  BP: (!) 153/78 (!) 135/58  Pulse: 85 76  Resp:    Temp: 98.2 F (36.8 C) 98.2 F (  36.8 C)  SpO2: 100% 100%   Vitals:   05/18/17 1500 05/18/17 2109 05/19/17 0500 05/19/17 0555  BP: 131/89 (!) 153/78  (!) 135/58  Pulse: 80 85  76  Resp: 16     Temp: 97.6 F (36.4 C) 98.2 F (36.8 C)  98.2 F (36.8 C)  TempSrc: Oral Oral  Oral  SpO2: 100% 100%  100%  Weight:   65.4 kg (144 lb 2.9 oz)   Height:        General: Pt is alert, awake, not in acute distress Cardiovascular: RRR, S1/S2 +, no rubs, no gallops Respiratory: CTA bilaterally, no wheezing, no rhonchi Abdominal: Soft, NT, ND, bowel sounds + Extremities: no edema, no cyanosis    The results of significant diagnostics from  this hospitalization (including imaging, microbiology, ancillary and laboratory) are listed below for reference.     Microbiology: Recent Results (from the past 240 hour(s))  Blood culture (routine x 2)     Status: None (Preliminary result)   Collection Time: 05/17/17  8:55 PM  Result Value Ref Range Status   Specimen Description BLOOD LEFT ANTECUBITAL  Final   Special Requests   Final    BOTTLES DRAWN AEROBIC AND ANAEROBIC Blood Culture adequate volume   Culture NO GROWTH < 24 HOURS  Final   Report Status PENDING  Incomplete  Blood culture (routine x 2)     Status: None (Preliminary result)   Collection Time: 05/17/17  9:05 PM  Result Value Ref Range Status   Specimen Description BLOOD RIGHT ARM  Final   Special Requests   Final    BOTTLES DRAWN AEROBIC AND ANAEROBIC Blood Culture adequate volume   Culture NO GROWTH < 24 HOURS  Final   Report Status PENDING  Incomplete  Wet prep, genital     Status: Abnormal   Collection Time: 05/17/17 10:07 PM  Result Value Ref Range Status   Yeast Wet Prep HPF POC NONE SEEN NONE SEEN Final   Trich, Wet Prep NONE SEEN NONE SEEN Final   Clue Cells Wet Prep HPF POC PRESENT (A) NONE SEEN Final   WBC, Wet Prep HPF POC FEW (A) NONE SEEN Final   Sperm NONE SEEN  Final     Labs: BNP (last 3 results) No results for input(s): BNP in the last 8760 hours. Basic Metabolic Panel: Recent Labs  Lab 05/16/17 2040 05/17/17 2055 05/18/17 0454 05/19/17 0513  NA 135 133* 135 137  K 3.3* 3.6 3.4* 4.1  CL 103 101 102 104  CO2 23 23 26 26   GLUCOSE 95 97 92 89  BUN 6 8 7 12   CREATININE 0.65 0.69 0.65 0.65  CALCIUM 8.4* 8.2* 8.3* 8.4*  MG  --   --   --  1.9   Liver Function Tests: Recent Labs  Lab 05/16/17 2040 05/17/17 2055  AST 12* 12*  ALT 9* 9*  ALKPHOS 66 65  BILITOT 0.9 0.3  PROT 7.5 7.0  ALBUMIN 2.6* 2.6*   Recent Labs  Lab 05/16/17 2040  LIPASE 28   No results for input(s): AMMONIA in the last 168 hours. CBC: Recent Labs  Lab  05/16/17 2040 05/17/17 2055 05/18/17 0454 05/19/17 0513  WBC 12.4* 8.5 8.4 7.4  NEUTROABS  --  6.0  --   --   HGB 10.0* 9.3* 9.1* 9.0*  HCT 31.2* 29.4* 28.9* 28.8*  MCV 80.4 82.1 81.6 81.6  PLT 254 238 220 250   Cardiac Enzymes: No results for input(s): CKTOTAL, CKMB,  CKMBINDEX, TROPONINI in the last 168 hours. BNP: Invalid input(s): POCBNP CBG: No results for input(s): GLUCAP in the last 168 hours. D-Dimer No results for input(s): DDIMER in the last 72 hours. Hgb A1c No results for input(s): HGBA1C in the last 72 hours. Lipid Profile No results for input(s): CHOL, HDL, LDLCALC, TRIG, CHOLHDL, LDLDIRECT in the last 72 hours. Thyroid function studies No results for input(s): TSH, T4TOTAL, T3FREE, THYROIDAB in the last 72 hours.  Invalid input(s): FREET3 Anemia work up No results for input(s): VITAMINB12, FOLATE, FERRITIN, TIBC, IRON, RETICCTPCT in the last 72 hours. Urinalysis    Component Value Date/Time   COLORURINE AMBER (A) 05/17/2017 2203   APPEARANCEUR HAZY (A) 05/17/2017 2203   LABSPEC 1.025 05/17/2017 2203   PHURINE 6.0 05/17/2017 2203   GLUCOSEU NEGATIVE 05/17/2017 2203   HGBUR MODERATE (A) 05/17/2017 2203   BILIRUBINUR SMALL (A) 05/17/2017 2203   KETONESUR NEGATIVE 05/17/2017 2203   PROTEINUR 30 (A) 05/17/2017 2203   UROBILINOGEN 1.0 07/22/2013 1727   NITRITE NEGATIVE 05/17/2017 2203   LEUKOCYTESUR SMALL (A) 05/17/2017 2203   Sepsis Labs Invalid input(s): PROCALCITONIN,  WBC,  LACTICIDVEN Microbiology Recent Results (from the past 240 hour(s))  Blood culture (routine x 2)     Status: None (Preliminary result)   Collection Time: 05/17/17  8:55 PM  Result Value Ref Range Status   Specimen Description BLOOD LEFT ANTECUBITAL  Final   Special Requests   Final    BOTTLES DRAWN AEROBIC AND ANAEROBIC Blood Culture adequate volume   Culture NO GROWTH < 24 HOURS  Final   Report Status PENDING  Incomplete  Blood culture (routine x 2)     Status: None  (Preliminary result)   Collection Time: 05/17/17  9:05 PM  Result Value Ref Range Status   Specimen Description BLOOD RIGHT ARM  Final   Special Requests   Final    BOTTLES DRAWN AEROBIC AND ANAEROBIC Blood Culture adequate volume   Culture NO GROWTH < 24 HOURS  Final   Report Status PENDING  Incomplete  Wet prep, genital     Status: Abnormal   Collection Time: 05/17/17 10:07 PM  Result Value Ref Range Status   Yeast Wet Prep HPF POC NONE SEEN NONE SEEN Final   Trich, Wet Prep NONE SEEN NONE SEEN Final   Clue Cells Wet Prep HPF POC PRESENT (A) NONE SEEN Final   WBC, Wet Prep HPF POC FEW (A) NONE SEEN Final   Sperm NONE SEEN  Final     Time coordinating discharge: 30 minutes  SIGNED:  [name], DO [facility] Pager [phone]  If 7PM-7AM, please contact night-coverage [email or web address] Password [facility] 05/19/2017, 9:34 AM

## 2017-05-19 NOTE — Plan of Care (Signed)
  Progressing Coping: Level of anxiety will decrease 05/19/2017 0225 - Progressing by West Pugh, RN Pain Managment: General experience of comfort will improve 05/19/2017 0225 - Progressing by West Pugh, RN Safety: Ability to remain free from injury will improve 05/19/2017 0225 - Progressing by West Pugh, RN Skin Integrity: Risk for impaired skin integrity will decrease 05/19/2017 0225 - Progressing by West Pugh, RN

## 2017-05-19 NOTE — Progress Notes (Signed)
Removed IV, provided discharge education/instructions, all questions and concerns addressed, Pt not in distress, discharged home with belongings. 

## 2017-05-19 NOTE — Plan of Care (Signed)
  Coping: Level of anxiety will decrease 05/19/2017 1114 - Progressing by Williams Che, RN   Pain Managment: General experience of comfort will improve 05/19/2017 1114 - Progressing by Williams Che, RN   Safety: Ability to remain free from injury will improve 05/19/2017 1114 - Progressing by Williams Che, RN

## 2017-05-22 LAB — CULTURE, BLOOD (ROUTINE X 2)
CULTURE: NO GROWTH
CULTURE: NO GROWTH
SPECIAL REQUESTS: ADEQUATE
Special Requests: ADEQUATE

## 2017-05-24 ENCOUNTER — Emergency Department (HOSPITAL_COMMUNITY): Payer: 59

## 2017-05-24 ENCOUNTER — Inpatient Hospital Stay (HOSPITAL_COMMUNITY)
Admission: EM | Admit: 2017-05-24 | Discharge: 2017-06-09 | DRG: 853 | Disposition: A | Discharge status: Home Health | Payer: 59 | Attending: Cardiothoracic Surgery | Admitting: Cardiothoracic Surgery

## 2017-05-24 ENCOUNTER — Encounter (HOSPITAL_COMMUNITY): Payer: Self-pay

## 2017-05-24 ENCOUNTER — Other Ambulatory Visit: Payer: Self-pay

## 2017-05-24 DIAGNOSIS — A409 Streptococcal sepsis, unspecified: Secondary | ICD-10-CM | POA: Diagnosis not present

## 2017-05-24 DIAGNOSIS — I330 Acute and subacute infective endocarditis: Secondary | ICD-10-CM | POA: Diagnosis present

## 2017-05-24 DIAGNOSIS — R7881 Bacteremia: Secondary | ICD-10-CM

## 2017-05-24 DIAGNOSIS — K047 Periapical abscess without sinus: Secondary | ICD-10-CM | POA: Diagnosis present

## 2017-05-24 DIAGNOSIS — I10 Essential (primary) hypertension: Secondary | ICD-10-CM | POA: Diagnosis present

## 2017-05-24 DIAGNOSIS — N12 Tubulo-interstitial nephritis, not specified as acute or chronic: Secondary | ICD-10-CM

## 2017-05-24 DIAGNOSIS — K029 Dental caries, unspecified: Secondary | ICD-10-CM | POA: Diagnosis present

## 2017-05-24 DIAGNOSIS — A408 Other streptococcal sepsis: Principal | ICD-10-CM | POA: Diagnosis present

## 2017-05-24 DIAGNOSIS — E876 Hypokalemia: Secondary | ICD-10-CM | POA: Diagnosis not present

## 2017-05-24 DIAGNOSIS — R600 Localized edema: Secondary | ICD-10-CM | POA: Diagnosis not present

## 2017-05-24 DIAGNOSIS — F40232 Fear of other medical care: Secondary | ICD-10-CM | POA: Diagnosis present

## 2017-05-24 DIAGNOSIS — R000 Tachycardia, unspecified: Secondary | ICD-10-CM | POA: Diagnosis present

## 2017-05-24 DIAGNOSIS — K224 Dyskinesia of esophagus: Secondary | ICD-10-CM | POA: Diagnosis present

## 2017-05-24 DIAGNOSIS — M109 Gout, unspecified: Secondary | ICD-10-CM | POA: Diagnosis present

## 2017-05-24 DIAGNOSIS — N760 Acute vaginitis: Secondary | ICD-10-CM

## 2017-05-24 DIAGNOSIS — B9689 Other specified bacterial agents as the cause of diseases classified elsewhere: Secondary | ICD-10-CM | POA: Diagnosis present

## 2017-05-24 DIAGNOSIS — R51 Headache: Secondary | ICD-10-CM

## 2017-05-24 DIAGNOSIS — E8770 Fluid overload, unspecified: Secondary | ICD-10-CM | POA: Diagnosis not present

## 2017-05-24 DIAGNOSIS — K045 Chronic apical periodontitis: Secondary | ICD-10-CM | POA: Diagnosis present

## 2017-05-24 DIAGNOSIS — D62 Acute posthemorrhagic anemia: Secondary | ICD-10-CM | POA: Diagnosis not present

## 2017-05-24 DIAGNOSIS — M264 Malocclusion, unspecified: Secondary | ICD-10-CM | POA: Diagnosis present

## 2017-05-24 DIAGNOSIS — B955 Unspecified streptococcus as the cause of diseases classified elsewhere: Secondary | ICD-10-CM | POA: Diagnosis present

## 2017-05-24 DIAGNOSIS — R079 Chest pain, unspecified: Secondary | ICD-10-CM

## 2017-05-24 DIAGNOSIS — R195 Other fecal abnormalities: Secondary | ICD-10-CM | POA: Diagnosis present

## 2017-05-24 DIAGNOSIS — K036 Deposits [accretions] on teeth: Secondary | ICD-10-CM | POA: Diagnosis present

## 2017-05-24 DIAGNOSIS — Z8249 Family history of ischemic heart disease and other diseases of the circulatory system: Secondary | ICD-10-CM

## 2017-05-24 DIAGNOSIS — Z87440 Personal history of urinary (tract) infections: Secondary | ICD-10-CM

## 2017-05-24 DIAGNOSIS — R519 Headache, unspecified: Secondary | ICD-10-CM

## 2017-05-24 DIAGNOSIS — I358 Other nonrheumatic aortic valve disorders: Secondary | ICD-10-CM

## 2017-05-24 DIAGNOSIS — I352 Nonrheumatic aortic (valve) stenosis with insufficiency: Secondary | ICD-10-CM | POA: Diagnosis present

## 2017-05-24 DIAGNOSIS — K219 Gastro-esophageal reflux disease without esophagitis: Secondary | ICD-10-CM | POA: Diagnosis present

## 2017-05-24 DIAGNOSIS — M79671 Pain in right foot: Secondary | ICD-10-CM

## 2017-05-24 DIAGNOSIS — B373 Candidiasis of vulva and vagina: Secondary | ICD-10-CM | POA: Diagnosis present

## 2017-05-24 DIAGNOSIS — G43909 Migraine, unspecified, not intractable, without status migrainosus: Secondary | ICD-10-CM | POA: Diagnosis present

## 2017-05-24 DIAGNOSIS — A419 Sepsis, unspecified organism: Secondary | ICD-10-CM | POA: Diagnosis present

## 2017-05-24 DIAGNOSIS — Z7982 Long term (current) use of aspirin: Secondary | ICD-10-CM

## 2017-05-24 DIAGNOSIS — Z79899 Other long term (current) drug therapy: Secondary | ICD-10-CM

## 2017-05-24 DIAGNOSIS — Z792 Long term (current) use of antibiotics: Secondary | ICD-10-CM

## 2017-05-24 DIAGNOSIS — Z952 Presence of prosthetic heart valve: Secondary | ICD-10-CM

## 2017-05-24 DIAGNOSIS — R0789 Other chest pain: Secondary | ICD-10-CM

## 2017-05-24 DIAGNOSIS — I38 Endocarditis, valve unspecified: Secondary | ICD-10-CM

## 2017-05-24 DIAGNOSIS — K083 Retained dental root: Secondary | ICD-10-CM | POA: Diagnosis present

## 2017-05-24 DIAGNOSIS — D638 Anemia in other chronic diseases classified elsewhere: Secondary | ICD-10-CM | POA: Diagnosis present

## 2017-05-24 LAB — URINALYSIS, ROUTINE W REFLEX MICROSCOPIC
BILIRUBIN URINE: NEGATIVE
GLUCOSE, UA: NEGATIVE mg/dL
KETONES UR: NEGATIVE mg/dL
LEUKOCYTES UA: NEGATIVE
NITRITE: NEGATIVE
PH: 6 (ref 5.0–8.0)
PROTEIN: NEGATIVE mg/dL
Specific Gravity, Urine: 1.024 (ref 1.005–1.030)

## 2017-05-24 LAB — CBC
HEMATOCRIT: 32.2 % — AB (ref 36.0–46.0)
Hemoglobin: 10.1 g/dL — ABNORMAL LOW (ref 12.0–15.0)
MCH: 25.7 pg — AB (ref 26.0–34.0)
MCHC: 31.4 g/dL (ref 30.0–36.0)
MCV: 81.9 fL (ref 78.0–100.0)
Platelets: 298 10*3/uL (ref 150–400)
RBC: 3.93 MIL/uL (ref 3.87–5.11)
RDW: 15.1 % (ref 11.5–15.5)
WBC: 12.3 10*3/uL — ABNORMAL HIGH (ref 4.0–10.5)

## 2017-05-24 LAB — BASIC METABOLIC PANEL
Anion gap: 8 (ref 5–15)
BUN: 7 mg/dL (ref 6–20)
CO2: 22 mmol/L (ref 22–32)
Calcium: 8.7 mg/dL — ABNORMAL LOW (ref 8.9–10.3)
Chloride: 103 mmol/L (ref 101–111)
Creatinine, Ser: 0.63 mg/dL (ref 0.44–1.00)
GFR calc Af Amer: >60 mL/min (ref >60)
GLUCOSE: 97 mg/dL (ref 65–99)
POTASSIUM: 3.5 mmol/L (ref 3.5–5.1)
Sodium: 133 mmol/L — ABNORMAL LOW (ref 135–145)

## 2017-05-24 LAB — I-STAT TROPONIN, ED
TROPONIN I, POC: 0 ng/mL (ref 0.00–0.08)
Troponin i, poc: 0 ng/mL (ref 0.00–0.08)

## 2017-05-24 LAB — D-DIMER, QUANTITATIVE: D-Dimer, Quant: 0.51 ug/mL-FEU — ABNORMAL HIGH (ref 0.00–0.50)

## 2017-05-24 LAB — HEPATIC FUNCTION PANEL
ALK PHOS: 64 U/L (ref 38–126)
ALT: 10 U/L — ABNORMAL LOW (ref 14–54)
AST: 15 U/L (ref 15–41)
Albumin: 2.8 g/dL — ABNORMAL LOW (ref 3.5–5.0)
BILIRUBIN TOTAL: 0.5 mg/dL (ref 0.3–1.2)
Total Protein: 7.4 g/dL (ref 6.5–8.1)

## 2017-05-24 LAB — I-STAT BETA HCG BLOOD, ED (MC, WL, AP ONLY): I-stat hCG, quantitative: <5 m[IU]/mL (ref <5)

## 2017-05-24 LAB — I-STAT CG4 LACTIC ACID, ED
Lactic Acid, Venous: 1.35 mmol/L (ref 0.5–1.9)
Lactic Acid, Venous: 1.18 mmol/L (ref 0.5–1.9)

## 2017-05-24 MED ORDER — SODIUM CHLORIDE 0.9 % IV BOLUS (SEPSIS)
1000.0000 mL | Freq: Once | INTRAVENOUS | Status: AC
  Administered 2017-05-24: 1000 mL via INTRAVENOUS

## 2017-05-24 MED ORDER — MORPHINE SULFATE (PF) 4 MG/ML IV SOLN
4.0000 mg | Freq: Once | INTRAVENOUS | Status: AC
  Administered 2017-05-24: 4 mg via INTRAVENOUS
  Filled 2017-05-24: qty 1

## 2017-05-24 MED ORDER — PANTOPRAZOLE SODIUM 40 MG IV SOLR
40.0000 mg | Freq: Once | INTRAVENOUS | Status: AC
  Administered 2017-05-24: 40 mg via INTRAVENOUS
  Filled 2017-05-24: qty 40

## 2017-05-24 MED ORDER — VANCOMYCIN HCL IN DEXTROSE 1-5 GM/200ML-% IV SOLN
1000.0000 mg | Freq: Once | INTRAVENOUS | Status: AC
  Administered 2017-05-24: 1000 mg via INTRAVENOUS
  Filled 2017-05-24: qty 200

## 2017-05-24 MED ORDER — ONDANSETRON HCL 4 MG/2ML IJ SOLN
4.0000 mg | Freq: Once | INTRAMUSCULAR | Status: AC
  Administered 2017-05-24: 4 mg via INTRAVENOUS
  Filled 2017-05-24: qty 2

## 2017-05-24 MED ORDER — SODIUM CHLORIDE 0.9 % IV SOLN
1000.0000 mL | INTRAVENOUS | Status: DC
  Administered 2017-05-25 – 2017-05-26 (×3): 1000 mL via INTRAVENOUS

## 2017-05-24 MED ORDER — IOPAMIDOL (ISOVUE-370) INJECTION 76%
INTRAVENOUS | Status: AC
  Administered 2017-05-24: 100 mL
  Filled 2017-05-24: qty 100

## 2017-05-24 MED ORDER — KETOROLAC TROMETHAMINE 15 MG/ML IJ SOLN
15.0000 mg | Freq: Once | INTRAMUSCULAR | Status: AC
  Administered 2017-05-24: 15 mg via INTRAVENOUS
  Filled 2017-05-24: qty 1

## 2017-05-24 MED ORDER — DEXTROSE 5 % IV SOLN
1.0000 g | Freq: Once | INTRAVENOUS | Status: AC
  Administered 2017-05-24: 1 g via INTRAVENOUS
  Filled 2017-05-24: qty 10

## 2017-05-24 MED ORDER — SODIUM CHLORIDE 0.9 % IV SOLN
Freq: Once | INTRAVENOUS | Status: AC
  Administered 2017-05-24: 21:00:00 via INTRAVENOUS

## 2017-05-24 MED ORDER — MORPHINE SULFATE (PF) 4 MG/ML IV SOLN
4.0000 mg | Freq: Once | INTRAVENOUS | Status: AC
Start: 2017-05-24 — End: 2017-05-24
  Administered 2017-05-24: 4 mg via INTRAVENOUS
  Filled 2017-05-24: qty 1

## 2017-05-24 NOTE — ED Provider Notes (Signed)
[facility] EMERGENCY DEPARTMENT Provider Note   CSN: [id number] Arrival date & time: 05/24/17  1348     History   Chief Complaint Chief Complaint  Patient presents with  . Flank Pain    HPI Krystal Shaffer is a 41 y.o. female.  HPI   Krystal Shaffer is a 41 year old female who presents to the emergency department for evaluation of fever and right-sided chest and back pain.  Patient was recently discharged 12/8 after being admitted for acute pyelonephritis.  She was being treated with ciprofloxacin BID (unfortunately no urine culture sent.)  Patient states that she was taking ciprofloxacin as prescribed, although 2 days ago had vomiting in which she was unable to hold down the antibiotic.  Reports intermittent fevers up to 102 F.  She also reports sudden onset right-sided chest pain which radiates below the right breast and to the right back.  Pain is severe, constant and feels like someone is punching her in the chest.  Pain is worsened when she takes a deep breath or moves in any way.  Tried taking Tylenol without significant relief.  She also endorses associated shortness of breath with chest pain.  Denies history of DVT/PE, denies leg swelling, denies exogenous estrogen use, denies recent surgeries or immobility.  She denies urinary frequency, dysuria, urinary odor, hematuria, abdominal pain, diarrhea, cough, congestion, headache, numbness, weakness.   Past Medical History:  Diagnosis Date  . Cystitis 05/2017  . Family history of adverse reaction to anesthesia    " mother has a hard time waking "  . Migraine   . Migraines     Patient Active Problem List   Diagnosis Date Noted  . Acute pyelonephritis 05/18/2017  . Back pain 05/18/2017  . Nausea and vomiting 05/18/2017  . UTI (urinary tract infection) 05/18/2017    Past Surgical History:  Procedure Laterality Date  . WISDOM TOOTH EXTRACTION      OB History    No data available       Home Medications     Prior to Admission medications   Medication Sig Start Date End Date Taking? Authorizing Provider  aspirin-acetaminophen-caffeine (EXCEDRIN MIGRAINE) [phone] MG tablet Take 1-2 tablets by mouth every 6 (six) hours as needed for headache.   Yes [provider]  ciprofloxacin (CIPRO) 500 MG tablet Take 1 tablet (500 mg total) by mouth 2 (two) times daily for 7 days. 05/19/17 05/26/17 Yes [name], DO  metroNIDAZOLE (FLAGYL) 500 MG tablet Take 1 tablet (500 mg total) by mouth 2 (two) times daily for 7 days. 05/19/17 05/26/17 Yes [name], DO  ondansetron (ZOFRAN ODT) 4 MG disintegrating tablet Take 1 tablet (4 mg total) by mouth every 8 (eight) hours as needed for nausea or vomiting. 05/08/17  Yes Duffy Bruce, MD    Family History History reviewed. No pertinent family history.  Social History Social History   Tobacco Use  . Smoking status: Current Some Day Smoker  . Smokeless tobacco: Never Used  Substance Use Topics  . Alcohol use: No  . Drug use: No     Allergies   Patient has no known allergies.   Review of Systems Review of Systems  Constitutional: Positive for chills, fatigue and fever.  HENT: Negative for congestion.   Eyes: Negative for visual disturbance.  Respiratory: Positive for shortness of breath. Negative for cough, chest tightness and wheezing.   Cardiovascular: Positive for chest pain. Negative for palpitations and leg swelling.  Gastrointestinal: Positive for nausea and vomiting.  Negative for abdominal pain.  Genitourinary: Negative for difficulty urinating, dysuria, flank pain, frequency, hematuria and vaginal discharge.  Musculoskeletal: Positive for back pain. Negative for gait problem and neck pain.  Skin: Negative for rash.  Neurological: Negative for dizziness, weakness, numbness and headaches.  Psychiatric/Behavioral: Negative for agitation.     Physical Exam Updated Vital Signs BP 127/60 (BP Location: Right Arm)   Pulse  100   Temp 98.2 F (36.8 C) (Oral)   Resp 16   Ht 5\' 4"  (1.626 m) Comment: Simultaneous filing. User may not have seen previous data.  Wt 68 kg (150 lb) Comment: Simultaneous filing. User may not have seen previous data.  LMP 04/24/2017 (Exact Date) Comment: neg preg test  SpO2 99%   BMI 25.75 kg/m   Physical Exam  Constitutional: She is oriented to person, place, and time. She appears well-developed and well-nourished. No distress.  HENT:  Head: Normocephalic and atraumatic.  Mouth/Throat: Oropharynx is clear and moist. No oropharyngeal exudate.  Eyes: Conjunctivae are normal. Pupils are equal, round, and reactive to light. Right eye exhibits no discharge. Left eye exhibits no discharge.  Neck: Normal range of motion. Neck supple. No JVD present. No tracheal deviation present.  Cardiovascular: Normal rate, regular rhythm and intact distal pulses. Exam reveals no friction rub.  Murmur (systolic) heard. Pulmonary/Chest: Effort normal and breath sounds normal. No stridor. No respiratory distress. She has no wheezes. She has no rales.  Tenderness to palpation over the right lateral chest wall and below the right breast.  No overlying ecchymosis, erythema or rash.  Abdominal: Soft. Bowel sounds are normal. She exhibits no distension and no mass. There is no tenderness. There is no guarding.  Negative Murphy sign.  Positive CVA tenderness on the right.   Musculoskeletal: Normal range of motion.  No leg swelling.  No calf tenderness.  Strength 5/5 in bilateral dorsiflexion and plantar flexion.  DP pulses 2+ bilaterally.  Lymphadenopathy:    She has no cervical adenopathy.  Neurological: She is alert and oriented to person, place, and time. Coordination normal.  Patellar reflex 2+ bilaterally.  Distal sensation to light touch intact in bilateral lower extremities.  Gait normal and coordination and balance.  Skin: Skin is warm and dry. Capillary refill takes less than 2 seconds. She is not  diaphoretic.  Psychiatric: She has a normal mood and affect. Her behavior is normal.  Nursing note and vitals reviewed.    ED Treatments / Results  Labs (all labs ordered are listed, but only abnormal results are displayed) Labs Reviewed  BASIC METABOLIC PANEL - Abnormal; Notable for the following components:      Result Value   Sodium 133 (*)    Calcium 8.7 (*)    All other components within normal limits  CBC - Abnormal; Notable for the following components:   WBC 12.3 (*)    Hemoglobin 10.1 (*)    HCT 32.2 (*)    MCH 25.7 (*)    All other components within normal limits  URINALYSIS, ROUTINE W REFLEX MICROSCOPIC  D-DIMER, QUANTITATIVE (NOT AT [facility])  I-STAT TROPONIN, ED  I-STAT BETA HCG BLOOD, ED ([facility], [facility], [facility] ONLY)  I-STAT CG4 LACTIC ACID, ED  I-STAT CG4 LACTIC ACID, ED    EKG  EKG Interpretation None       Radiology Dg Chest 2 View  Result Date: 05/24/2017 CLINICAL DATA:  Chest pain EXAM: CHEST  2 VIEW COMPARISON:  May 08, 2017 FINDINGS: Heart is mildly enlarged with pulmonary  vascularity within normal limits. No edema or consolidation. No adenopathy. No evident bone lesions. IMPRESSION: Mild cardiac enlargement.  No edema or consolidation. Electronically Signed   By: Lowella Grip III M.D.   On: 05/24/2017 14:38    Procedures Procedures (including critical care time)  Medications Ordered in ED Medications - No data to display   Initial Impression / Assessment and Plan / ED Course  I have reviewed the triage vital signs and the nursing notes.  Pertinent labs & imaging results that were available during my care of the patient were reviewed by me and considered in my medical decision making (see chart for details).  Clinical Course as of May 24 1905  Thu May 24, 2017  1850 Patient states she has a headache after being given morphine, Toradol ordered.   [ES]    Clinical Course User Index [ES] Glyn Ade, PA-C   Patient presents with fever  and right sided chest pain which radiates to the back. Was recently admitted for acute pyelonephritis and discharged with PO ciprofloxacin which she has not been able to hold down for the past several days due to N/V.   In the ED, patient is afebrile, non-toxic appearing and VSS.  Initial labs reviewed. She has a leukocytosis with WBC 12.3 (previously 7.4 five days ago.) Hgb is 10.1, baseline. BMP unremarkable. Lactic acid negative. Beta hCG negative. Troponin negative. EKG shows poor R-wave progression which is new since last EKG 05/09/2017. CXR without acute abnormality. UA does not appear to be infected.   Will get Ddimer given the patient's chest pain is pleuritic and she has associated shortness of breath. Was recently hospitalized. She is borderline tachycardic (pulse 100). No tachypnea.  No history of DVT/PE.  Ddimer is positive. CTA ordered. CTA does not show evidence of PE. Discussed this patient with Dr. Vallery Ridge who also saw the patient and agrees to disposition the patient at sign out.     Final Clinical Impressions(s) / ED Diagnoses   Final diagnoses:  Right-sided chest pain    ED Discharge Orders    None       Bernarda Caffey 05/25/17 0840    Charlesetta Shanks, MD 05/27/17 2223

## 2017-05-24 NOTE — ED Provider Notes (Signed)
Medical screening examination/treatment/procedure(s) were conducted as a shared visit with non-physician practitioner(s) and myself.  I personally evaluated the patient during the encounter.   EKG Interpretation  Date/Time:  Thursday May 24 2017 14:08:25 EST Ventricular Rate:  98 PR Interval:  152 QRS Duration: 92 QT Interval:  372 QTC Calculation: 474 R Axis:   116 Text Interpretation:  Normal sinus rhythm Right axis deviation Cannot rule out Anterior infarct , age undetermined Abnormal ECG agree. poor r wave progression compared to previous. Confirmed by Charlesetta Shanks [phone]) on 05/24/2017 7:18:32 PM     She reports she had sudden onset of severe right-sided chest pain last night.  She reports that nearly brought her to her knees.  Patient reports she is also felt short of breath.  She was discharged from the hospital 5 days ago with pyelonephritis.  Reports she has had some intermittent leg pain that is been coming and going.  No known history of PE.  Patient is alert and appropriate.  She does not have respiratory distress at rest.  Heart is tachycardic and regular.  He has diastolic and systolic murmur.  Lungs are clear without wheeze rhonchi or rale.  No lower extremity edema.  At this time, CT PE study is pending.  First suspicion is for PE.  Depending on results, will proceed with other diagnostic evaluation if negative for PE.  Patient reports she has gotten pain improvement with combination of morphine and Toradol.  First troponin is normal.  She still has some pain but now feels is much easier to take a deep breath.  Will have the patient placed on a monitor and continue workup.  The PE study negative.  Patient has had fever and persisting chest pain.  At this time concern for possible bacteremia with heart murmur.  I reviewed with cardiology fellow.  He has reviewed EKG and has no immediate suspicions for high probability endocarditis\myocarditis.  With patient's symptoms and  ongoing chest pain, fever, night sweats and recent E. coli pyelonephritis, will opt to admit on IV antibiotics for monitoring and a.m. Echo.  CRITICAL CARE Performed by: Charlesetta Shanks   Total critical care time: 45 minutes  Critical care time was exclusive of separately billable procedures and treating other patients.  Critical care was necessary to treat or prevent imminent or life-threatening deterioration.  Critical care was time spent personally by me on the following activities: development of treatment plan with patient and/or surrogate as well as nursing, discussions with consultants, evaluation of patient's response to treatment, examination of patient, obtaining history from patient or surrogate, ordering and performing treatments and interventions, ordering and review of laboratory studies, ordering and review of radiographic studies, pulse oximetry and re-evaluation of patient's condition.   Charlesetta Shanks, MD 05/27/17 2222

## 2017-05-24 NOTE — ED Triage Notes (Addendum)
Pt endorses right sided flank pain since 11/27, denies dysuria. Pt was treated for an infection and told she had an "inflamed kidney" Pt also endorses "yesterday I was laying in bed and my chest started hurting under my left breast and it hurts every time I take a deep breath" Pt was placed on antibiotics last visit but did not take her antibiotic last night due to vomiting.

## 2017-05-24 NOTE — Progress Notes (Signed)
Called by ER  To discuss  EKG  And Possible  Systolic  Murmur on  Examination , in  Patient  With  Under treated  Or  Initiated  Treatment  Of E.COli, CYSTITIS,  Informed , tachycardia  Can be  A  Cause of  Flow  Related murmurs  , but in lieu of  Increased  Wbc  And symptoms, its likely  That there  Is  An underlying  Infection  ,  Suggested  Echo will  Be  Reasonable tool to determine  Endocarditis if  Suspected.  ER  Physician to possibly  Overnight  Admit  To IM  And per  My  Earlier  Suggestion  Echo  Can be  Performed if  Still needed  By IM .

## 2017-05-25 ENCOUNTER — Observation Stay (HOSPITAL_COMMUNITY): Payer: 59

## 2017-05-25 ENCOUNTER — Other Ambulatory Visit: Payer: Self-pay

## 2017-05-25 ENCOUNTER — Encounter (HOSPITAL_COMMUNITY): Payer: Self-pay | Admitting: Infectious Diseases

## 2017-05-25 DIAGNOSIS — N12 Tubulo-interstitial nephritis, not specified as acute or chronic: Secondary | ICD-10-CM | POA: Diagnosis not present

## 2017-05-25 DIAGNOSIS — A419 Sepsis, unspecified organism: Secondary | ICD-10-CM | POA: Diagnosis not present

## 2017-05-25 DIAGNOSIS — R509 Fever, unspecified: Secondary | ICD-10-CM | POA: Diagnosis not present

## 2017-05-25 DIAGNOSIS — R011 Cardiac murmur, unspecified: Secondary | ICD-10-CM | POA: Diagnosis not present

## 2017-05-25 DIAGNOSIS — I38 Endocarditis, valve unspecified: Secondary | ICD-10-CM | POA: Diagnosis not present

## 2017-05-25 DIAGNOSIS — I5030 Unspecified diastolic (congestive) heart failure: Secondary | ICD-10-CM | POA: Diagnosis not present

## 2017-05-25 LAB — CBC
HCT: 28.7 % — ABNORMAL LOW (ref 36.0–46.0)
HEMOGLOBIN: 8.8 g/dL — AB (ref 12.0–15.0)
MCH: 25.4 pg — ABNORMAL LOW (ref 26.0–34.0)
MCHC: 30.7 g/dL (ref 30.0–36.0)
MCV: 82.7 fL (ref 78.0–100.0)
PLATELETS: 254 10*3/uL (ref 150–400)
RBC: 3.47 MIL/uL — ABNORMAL LOW (ref 3.87–5.11)
RDW: 15.2 % (ref 11.5–15.5)
WBC: 6.8 10*3/uL (ref 4.0–10.5)

## 2017-05-25 LAB — BLOOD CULTURE ID PANEL (REFLEXED)
ACINETOBACTER BAUMANNII: NOT DETECTED
CANDIDA ALBICANS: NOT DETECTED
CANDIDA GLABRATA: NOT DETECTED
CANDIDA TROPICALIS: NOT DETECTED
Candida krusei: NOT DETECTED
Candida parapsilosis: NOT DETECTED
ENTEROBACTER CLOACAE COMPLEX: NOT DETECTED
ENTEROBACTERIACEAE SPECIES: NOT DETECTED
ENTEROCOCCUS SPECIES: NOT DETECTED
Escherichia coli: NOT DETECTED
HAEMOPHILUS INFLUENZAE: NOT DETECTED
Klebsiella oxytoca: NOT DETECTED
Klebsiella pneumoniae: NOT DETECTED
Listeria monocytogenes: NOT DETECTED
NEISSERIA MENINGITIDIS: NOT DETECTED
PSEUDOMONAS AERUGINOSA: NOT DETECTED
Proteus species: NOT DETECTED
STREPTOCOCCUS AGALACTIAE: NOT DETECTED
STREPTOCOCCUS PNEUMONIAE: NOT DETECTED
STREPTOCOCCUS SPECIES: DETECTED — AB
Serratia marcescens: NOT DETECTED
Staphylococcus aureus (BCID): NOT DETECTED
Staphylococcus species: NOT DETECTED
Streptococcus pyogenes: NOT DETECTED

## 2017-05-25 LAB — DIFFERENTIAL
BASOS PCT: 0 %
Basophils Absolute: 0 10*3/uL (ref 0.0–0.1)
EOS ABS: 0.1 10*3/uL (ref 0.0–0.7)
Eosinophils Relative: 1 %
LYMPHS ABS: 1.9 10*3/uL (ref 0.7–4.0)
Lymphocytes Relative: 25 %
Monocytes Absolute: 0.3 10*3/uL (ref 0.1–1.0)
Monocytes Relative: 4 %
NEUTROS PCT: 70 %
Neutro Abs: 5.4 10*3/uL (ref 1.7–7.7)

## 2017-05-25 LAB — IRON AND TIBC
IRON: 21 ug/dL — AB (ref 28–170)
SATURATION RATIOS: 11 % (ref 10.4–31.8)
TIBC: 183 ug/dL — AB (ref 250–450)
UIBC: 162 ug/dL

## 2017-05-25 LAB — BASIC METABOLIC PANEL
Anion gap: 5 (ref 5–15)
BUN: 7 mg/dL (ref 6–20)
CALCIUM: 7.8 mg/dL — AB (ref 8.9–10.3)
CO2: 24 mmol/L (ref 22–32)
CREATININE: 0.6 mg/dL (ref 0.44–1.00)
Chloride: 105 mmol/L (ref 101–111)
GFR calc Af Amer: >60 mL/min (ref >60)
GLUCOSE: 91 mg/dL (ref 65–99)
Potassium: 3.7 mmol/L (ref 3.5–5.1)
Sodium: 134 mmol/L — ABNORMAL LOW (ref 135–145)

## 2017-05-25 LAB — INFLUENZA PANEL BY PCR (TYPE A & B)
INFLAPCR: NEGATIVE
INFLBPCR: NEGATIVE

## 2017-05-25 LAB — MAGNESIUM: MAGNESIUM: 1.6 mg/dL — AB (ref 1.7–2.4)

## 2017-05-25 LAB — RETICULOCYTES
RBC.: 3.39 MIL/uL — ABNORMAL LOW (ref 3.87–5.11)
RETIC COUNT ABSOLUTE: 111.9 10*3/uL (ref 19.0–186.0)
Retic Ct Pct: 3.3 % — ABNORMAL HIGH (ref 0.4–3.1)

## 2017-05-25 LAB — HIV ANTIBODY (ROUTINE TESTING W REFLEX): HIV SCREEN 4TH GENERATION: NONREACTIVE

## 2017-05-25 LAB — PHOSPHORUS: PHOSPHORUS: 3.1 mg/dL (ref 2.5–4.6)

## 2017-05-25 LAB — TSH: TSH: 3.337 u[IU]/mL (ref 0.350–4.500)

## 2017-05-25 LAB — SEDIMENTATION RATE: SED RATE: 77 mm/h — AB (ref 0–22)

## 2017-05-25 LAB — FERRITIN: Ferritin: 56 ng/mL (ref 11–307)

## 2017-05-25 LAB — VITAMIN B12: Vitamin B-12: 417 pg/mL (ref 180–914)

## 2017-05-25 LAB — C-REACTIVE PROTEIN: CRP: 8.5 mg/dL — ABNORMAL HIGH (ref <1.0)

## 2017-05-25 MED ORDER — ONDANSETRON HCL 4 MG PO TABS: 4.0000 mg | ORAL_TABLET | Freq: Four times a day (QID) | ORAL | Status: DC | PRN

## 2017-05-25 MED ORDER — ONDANSETRON HCL 4 MG/2ML IJ SOLN
4.0000 mg | Freq: Four times a day (QID) | INTRAMUSCULAR | Status: DC | PRN
  Administered 2017-05-25: 4 mg via INTRAVENOUS

## 2017-05-25 MED ORDER — MORPHINE SULFATE (PF) 4 MG/ML IV SOLN
4.0000 mg | Freq: Once | INTRAVENOUS | Status: AC
  Administered 2017-05-25: 4 mg via INTRAVENOUS
  Filled 2017-05-25: qty 1

## 2017-05-25 MED ORDER — GI COCKTAIL ~~LOC~~
30.0000 mL | Freq: Once | ORAL | Status: AC
  Administered 2017-05-26: 30 mL via ORAL
  Filled 2017-05-25: qty 30

## 2017-05-25 MED ORDER — POLYETHYLENE GLYCOL 3350 17 G PO PACK: 17.0000 g | PACK | Freq: Every day | ORAL | Status: DC | PRN

## 2017-05-25 MED ORDER — ENOXAPARIN SODIUM 40 MG/0.4ML ~~LOC~~ SOLN
40.0000 mg | SUBCUTANEOUS | Status: AC
  Administered 2017-05-25 – 2017-05-30 (×6): 40 mg via SUBCUTANEOUS
  Filled 2017-05-25 (×6): qty 0.4

## 2017-05-25 MED ORDER — ACETAMINOPHEN 650 MG RE SUPP: 650.0000 mg | Freq: Four times a day (QID) | RECTAL | Status: DC | PRN

## 2017-05-25 MED ORDER — KETOROLAC TROMETHAMINE 30 MG/ML IJ SOLN
30.0000 mg | Freq: Four times a day (QID) | INTRAMUSCULAR | Status: AC | PRN
  Administered 2017-05-25 – 2017-05-28 (×9): 30 mg via INTRAVENOUS
  Filled 2017-05-25 (×10): qty 1

## 2017-05-25 MED ORDER — ALUM & MAG HYDROXIDE-SIMETH 200-200-20 MG/5ML PO SUSP
30.0000 mL | Freq: Four times a day (QID) | ORAL | Status: DC | PRN
  Administered 2017-05-26: 30 mL via ORAL
  Filled 2017-05-25: qty 30

## 2017-05-25 MED ORDER — ACETAMINOPHEN 325 MG PO TABS
650.0000 mg | ORAL_TABLET | Freq: Four times a day (QID) | ORAL | Status: DC | PRN
  Administered 2017-06-01: 650 mg via ORAL
  Filled 2017-05-25: qty 2

## 2017-05-25 MED ORDER — KETOROLAC TROMETHAMINE 30 MG/ML IJ SOLN
30.0000 mg | Freq: Once | INTRAMUSCULAR | Status: AC
Start: 2017-05-25 — End: 2017-05-25
  Administered 2017-05-25: 30 mg via INTRAVENOUS
  Filled 2017-05-25: qty 1

## 2017-05-25 MED ORDER — MORPHINE SULFATE (PF) 4 MG/ML IV SOLN
2.0000 mg | INTRAVENOUS | Status: DC | PRN
  Administered 2017-05-25 – 2017-06-04 (×30): 2 mg via INTRAVENOUS
  Filled 2017-05-25 (×30): qty 1

## 2017-05-25 MED ORDER — METRONIDAZOLE 500 MG PO TABS
500.0000 mg | ORAL_TABLET | Freq: Two times a day (BID) | ORAL | Status: DC
  Administered 2017-05-25 – 2017-05-26 (×4): 500 mg via ORAL
  Filled 2017-05-25 (×4): qty 1

## 2017-05-25 MED ORDER — DEXTROSE 5 % IV SOLN
2.0000 g | INTRAVENOUS | Status: DC
  Administered 2017-05-25 – 2017-06-03 (×10): 2 g via INTRAVENOUS
  Filled 2017-05-25 (×11): qty 2

## 2017-05-25 NOTE — Progress Notes (Signed)
  Echocardiogram 2D Echocardiogram has been performed.  Krystal Shaffer 05/25/2017, 10:27 AM

## 2017-05-25 NOTE — Progress Notes (Signed)
PHARMACY - PHYSICIAN COMMUNICATION CRITICAL VALUE ALERT - BLOOD CULTURE IDENTIFICATION (BCID)  Krystal Shaffer is an 41 y.o. female who presented to [facility] on 05/24/2017 with a chief complaint of fever, recent Ecoli UTI. Also with possible Ao valve endocarditis. Received 1 dose of vancomycin and ceftriaxone yesterday. ID following and plan was to watch blood cultures off antibiotics, however,  BCID from 12/13 blood cultures grew 2/2 streptococcus species. Will add ceftriaxone.  Name of physician (or Provider) Contacted: Dr. Megan Salon  Current antibiotics: None  Changes to prescribed antibiotics recommended:  Recommendations accepted by provider  Results for orders placed or performed during the hospital encounter of 05/24/17  Blood Culture ID Panel (Reflexed) (Collected: 05/24/2017 11:14 PM)  Result Value Ref Range   Enterococcus species NOT DETECTED NOT DETECTED   Listeria monocytogenes NOT DETECTED NOT DETECTED   Staphylococcus species NOT DETECTED NOT DETECTED   Staphylococcus aureus NOT DETECTED NOT DETECTED   Streptococcus species DETECTED (A) NOT DETECTED   Streptococcus agalactiae NOT DETECTED NOT DETECTED   Streptococcus pneumoniae NOT DETECTED NOT DETECTED   Streptococcus pyogenes NOT DETECTED NOT DETECTED   Acinetobacter baumannii NOT DETECTED NOT DETECTED   Enterobacteriaceae species NOT DETECTED NOT DETECTED   Enterobacter cloacae complex NOT DETECTED NOT DETECTED   Escherichia coli NOT DETECTED NOT DETECTED   Klebsiella oxytoca NOT DETECTED NOT DETECTED   Klebsiella pneumoniae NOT DETECTED NOT DETECTED   Proteus species NOT DETECTED NOT DETECTED   Serratia marcescens NOT DETECTED NOT DETECTED   Haemophilus influenzae NOT DETECTED NOT DETECTED   Neisseria meningitidis NOT DETECTED NOT DETECTED   Pseudomonas aeruginosa NOT DETECTED NOT DETECTED   Candida albicans NOT DETECTED NOT DETECTED   Candida glabrata NOT DETECTED NOT DETECTED   Candida krusei NOT DETECTED NOT  DETECTED   Candida parapsilosis NOT DETECTED NOT DETECTED   Candida tropicalis NOT DETECTED NOT DETECTED    Deboraha Sprang 05/25/2017  9:26 PM

## 2017-05-25 NOTE — Progress Notes (Signed)
PROGRESS NOTE    Krystal Shaffer  URK:[id number] DOB: 25-May-1976 DOA: 05/24/2017 PCP: Sofie Rower, PA-C   Brief Narrative: Krystal Shaffer is a 41 y.o. female with medical history significant for recent E. coli UTI with failed outpatient management requiring short inpatient stay for antibiotic therapy who presents to the ED with continued fever, chills, night sweats, myalgias. She presented with sepsis concerning for endocarditis with new murmr   Assessment & Plan:   Active Problems:   Sepsis ([place])   Sepsis Unknown source. Possibly endocarditis versus untreated UTI (patient has been unable to keep medication down secondary to nausea and vomiting). Physiology improved. Received one dose of ceftriaxone and vancomycin in the ED. -Hold antibiotics for now -urine/blood culture pending  Diastolic murmur New per patient and records. Very prominent in the aortic valve region. Patient is not an IV drug user.  History of UTI Flank pain Patient with under-treated UTI secondary to non-adherence secondary to nausea and vomiting.  -Urine culture pending -Holding antibiotics since stable  Bacterial vaginosis -Continue metronidazole  Normocytic anemia Iron studies in line with anemia of chronic disease  Headache Chronic. -Continue morphine prn   DVT prophylaxis: Lovenox Code Status: Full code Family Communication: None at bedside Disposition Plan: Discharge pending workup   Consultants:   None  Procedures:   Echo pending  Antimicrobials:  Vancomycin  Ceftriaxone  Metronidazole    Subjective: Flank pain and headache.  Objective: Vitals:   05/24/17 2300 05/24/17 2330 05/25/17 0040 05/25/17 0528  BP: 130/66 119/63 (!) 129/54 (!) 120/54  Pulse: 97 94 89 (!) 102  Resp: (!) 24 (!) 21 19 20   Temp:   98.3 F (36.8 C) 98.8 F (37.1 C)  TempSrc:   Oral Oral  SpO2: 99% 98% 100% 100%  Weight:      Height:        Intake/Output Summary (Last 24 hours) at  05/25/2017 1205 Last data filed at 05/25/2017 1100 Gross per 24 hour  Intake 1745 ml  Output 700 ml  Net 1045 ml   Filed Weights   05/24/17 1405  Weight: 68 kg (150 lb)    Examination:  General exam: Appears calm and comfortable Respiratory system: Clear to auscultation. Respiratory effort normal. Cardiovascular system: S1 & S2 heard, RRR. 3/6 diastolic murmurs heard best in RUSB, rubs, gallops or clicks. Gastrointestinal system: Abdomen is nondistended, soft and nontender. No organomegaly or masses felt. Normal bowel sounds heard. Central nervous system: Alert and oriented. No focal neurological deficits. Extremities: No edema. No calf tenderness Skin: No cyanosis. No rashes Psychiatry: Judgement and insight appear normal. Mood & affect appropriate.     Data Reviewed: I have personally reviewed following labs and imaging studies  CBC: Recent Labs  Lab 05/19/17 0513 05/24/17 1433 05/25/17 0034 05/25/17 0536  WBC 7.4 12.3*  --  6.8  NEUTROABS  --   --  5.4  --   HGB 9.0* 10.1*  --  8.8*  HCT 28.8* 32.2*  --  28.7*  MCV 81.6 81.9  --  82.7  PLT 250 298  --  831   Basic Metabolic Panel: Recent Labs  Lab 05/19/17 0513 05/24/17 1433 05/25/17 0034 05/25/17 0536  NA 137 133*  --  134*  K 4.1 3.5  --  3.7  CL 104 103  --  105  CO2 26 22  --  24  GLUCOSE 89 97  --  91  BUN 12 7  --  7  CREATININE 0.65 0.63  --  0.60  CALCIUM 8.4* 8.7*  --  7.8*  MG 1.9  --  1.6*  --   PHOS  --   --  3.1  --    GFR: Estimated Creatinine Clearance: 88.5 mL/min (by C-G formula based on SCr of 0.6 mg/dL). Liver Function Tests: Recent Labs  Lab 05/24/17 1433  AST 15  ALT 10*  ALKPHOS 64  BILITOT 0.5  PROT 7.4  ALBUMIN 2.8*   No results for input(s): LIPASE, AMYLASE in the last 168 hours. No results for input(s): AMMONIA in the last 168 hours. Coagulation Profile: No results for input(s): INR, PROTIME in the last 168 hours. Cardiac Enzymes: No results for input(s):  CKTOTAL, CKMB, CKMBINDEX, TROPONINI in the last 168 hours. BNP (last 3 results) No results for input(s): PROBNP in the last 8760 hours. HbA1C: No results for input(s): HGBA1C in the last 72 hours. CBG: No results for input(s): GLUCAP in the last 168 hours. Lipid Profile: No results for input(s): CHOL, HDL, LDLCALC, TRIG, CHOLHDL, LDLDIRECT in the last 72 hours. Thyroid Function Tests: Recent Labs    05/25/17 0034  TSH 3.337   Anemia Panel: Recent Labs    05/25/17 0034  VITAMINB12 417  FERRITIN 56  TIBC 183*  IRON 21*  RETICCTPCT 3.3*   Sepsis Labs: Recent Labs  Lab 05/24/17 1452 05/24/17 1811  LATICACIDVEN 1.35 1.18    Recent Results (from the past 240 hour(s))  Blood culture (routine x 2)     Status: None   Collection Time: 05/17/17  8:55 PM  Result Value Ref Range Status   Specimen Description BLOOD LEFT ANTECUBITAL  Final   Special Requests   Final    BOTTLES DRAWN AEROBIC AND ANAEROBIC Blood Culture adequate volume   Culture NO GROWTH 5 DAYS  Final   Report Status 05/22/2017 FINAL  Final  Blood culture (routine x 2)     Status: None   Collection Time: 05/17/17  9:05 PM  Result Value Ref Range Status   Specimen Description BLOOD RIGHT ARM  Final   Special Requests   Final    BOTTLES DRAWN AEROBIC AND ANAEROBIC Blood Culture adequate volume   Culture NO GROWTH 5 DAYS  Final   Report Status 05/22/2017 FINAL  Final  Wet prep, genital     Status: Abnormal   Collection Time: 05/17/17 10:07 PM  Result Value Ref Range Status   Yeast Wet Prep HPF POC NONE SEEN NONE SEEN Final   Trich, Wet Prep NONE SEEN NONE SEEN Final   Clue Cells Wet Prep HPF POC PRESENT (A) NONE SEEN Final   WBC, Wet Prep HPF POC FEW (A) NONE SEEN Final   Sperm NONE SEEN  Final  Culture, blood (routine x 2)     Status: None (Preliminary result)   Collection Time: 05/24/17 11:05 PM  Result Value Ref Range Status   Specimen Description BLOOD RIGHT ANTECUBITAL  Final   Special Requests   Final     BOTTLES DRAWN AEROBIC AND ANAEROBIC Blood Culture adequate volume   Culture NO GROWTH < 12 HOURS  Final   Report Status PENDING  Incomplete         Radiology Studies: Dg Chest 2 View  Result Date: 05/24/2017 CLINICAL DATA:  Chest pain EXAM: CHEST  2 VIEW COMPARISON:  May 08, 2017 FINDINGS: Heart is mildly enlarged with pulmonary vascularity within normal limits. No edema or consolidation. No adenopathy. No evident bone lesions. IMPRESSION: Mild cardiac enlargement.  No edema or consolidation.  Electronically Signed   By: Lowella Grip III M.D.   On: 05/24/2017 14:38   Ct Angio Chest Pe W And/or Wo Contrast  Result Date: 05/24/2017 CLINICAL DATA:  Right-sided chest pain radiating to the back for 2 days. Dyspnea. EXAM: CT ANGIOGRAPHY CHEST WITH CONTRAST TECHNIQUE: Multidetector CT imaging of the chest was performed using the standard protocol during bolus administration of intravenous contrast. Multiplanar CT image reconstructions and MIPs were obtained to evaluate the vascular anatomy. CONTRAST:  124mL ISOVUE-370 IOPAMIDOL (ISOVUE-370) INJECTION 76% COMPARISON:  Radiographs 05/24/2017 at 14:31 FINDINGS: Cardiovascular: Satisfactory opacification of the pulmonary arteries to the segmental level. No evidence of pulmonary embolism. Normal heart size. No pericardial effusion. The thoracic aorta is normal in caliber and intact. Mediastinum/Nodes: No enlarged mediastinal, hilar, or axillary lymph nodes. Thyroid gland, trachea, and esophagus demonstrate no significant findings. Lungs/Pleura: Lungs are clear. No pleural effusion or pneumothorax. Upper Abdomen: No acute abnormality. Musculoskeletal: No chest wall abnormality. No acute or significant osseous findings. Review of the MIP images confirms the above findings. IMPRESSION: Negative for pulmonary embolism.  No significant abnormality. Electronically Signed   By: Andreas Newport M.D.   On: 05/24/2017 20:32        Scheduled  Meds: . enoxaparin (LOVENOX) injection  40 mg Subcutaneous Q24H  . metroNIDAZOLE  500 mg Oral Q12H   Continuous Infusions: . sodium chloride 1,000 mL (05/25/17 0944)     LOS: 0 days     Cordelia Poche, MD [facility] 05/25/2017, 12:05 PM Pager: [phone]  If 7PM-7AM, please contact night-coverage [email or web address] Password [email or web address] 05/25/2017, 12:05 PM

## 2017-05-25 NOTE — H&P (Signed)
History and Physical    Krystal Shaffer SNK:[id number] DOB: 05/04/1976 DOA: 05/24/2017  PCP: Sofie Rower, PA-C Patient coming from: Home  I have personally briefly reviewed patient's old medical records in [place]  Chief Complaint: Fever, chills  HPI: Krystal Shaffer is a 41 y.o. female with medical history significant for recent E. coli UTI with failed outpatient management requiring short inpatient stay for antibiotic therapy who presents to the ED with continued fever, chills, night sweats, myalgias.  Patient reports that for the last month she has been experiencing fevers to 102 at home, with associated chills and myalgias, as well as drenching night sweats.  She denies the presence of rash, synovitis.  She does endorse dry cough, no hemoptysis, and new onset chest pain and dyspnea within the last week.  She denies any sick contacts.  No recent travel.  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  No history of IV drug use.  She is not currently sexually active.  No known history of TB exposure, no incarceration, no homelessness.  History of autoimmune disease or malignancy; no family history of either.  She does note that she cracked a tooth approximately 3 months ago.  Otherwise, she complains of persistent right-sided lower back pain.  She did previously have foul-smelling urine, but reports that this is improved with antibiotic therapy.  At her most recent hospitalization, she was also noted to have clue cells on wet prep and was discharged home with a course of ciprofloxacin and metronidazole for pyelonephritis and BV, respectively.  ED Course: In the ED, the patient was afebrile, tachycardic with heart rate in the 90s, blood pressure normotensive and saturating comfortably on room air.  Labs notable for WBC 12.3, Hgb 10.1, platelets 298, normal electrolytes and renal function albumin 2.8, LFTs within normal limits, d-dimer 510.  Urinalysis unremarkable.  Due to the  patient's chest pain and tachycardia, CTA chest was obtained which demonstrated no evidence of filling defect in the pulmonary arterial system, nor did it demonstrate lymphadenopathy or opacity suggestive of infection or malignancy.  Patient was given dose of vancomycin and ceftriaxone while in the ED, along with aggressive volume resuscitation.  Review of Systems: As per HPI otherwise 10 point review of systems negative.   Past Medical History:  Diagnosis Date  . Cystitis 05/2017  . Family history of adverse reaction to anesthesia    " mother has a hard time waking "  . Migraine   . Migraines     Past Surgical History:  Procedure Laterality Date  . WISDOM TOOTH EXTRACTION       reports that she has been smoking.  she has never used smokeless tobacco. She reports that she does not drink alcohol or use drugs.  No Known Allergies  History reviewed. No pertinent family history.  Prior to Admission medications   Medication Sig Start Date End Date Taking? Authorizing Provider  aspirin-acetaminophen-caffeine (EXCEDRIN MIGRAINE) [phone] MG tablet Take 1-2 tablets by mouth every 6 (six) hours as needed for headache.   Yes [provider]  ciprofloxacin (CIPRO) 500 MG tablet Take 1 tablet (500 mg total) by mouth 2 (two) times daily for 7 days. 05/19/17 05/26/17 Yes [name], DO  metroNIDAZOLE (FLAGYL) 500 MG tablet Take 1 tablet (500 mg total) by mouth 2 (two) times daily for 7 days. 05/19/17 05/26/17 Yes [name], DO  ondansetron (ZOFRAN ODT) 4 MG disintegrating tablet Take 1 tablet (4 mg total) by mouth every 8 (eight)  hours as needed for nausea or vomiting. 05/08/17  Yes Duffy Bruce, MD    Physical Exam: Vitals:   05/24/17 2230 05/24/17 2300 05/24/17 2330 05/25/17 0040  BP: (!) 127/55 130/66 119/63 (!) 129/54  Pulse: 95 97 94 89  Resp: 20 (!) 24 (!) 21 19  Temp:    98.3 F (36.8 C)  TempSrc:    Oral  SpO2: 98% 99% 98% 100%  Weight:      Height:         Constitutional: NAD, calm, comfortable Vitals:   05/24/17 2230 05/24/17 2300 05/24/17 2330 05/25/17 0040  BP: (!) 127/55 130/66 119/63 (!) 129/54  Pulse: 95 97 94 89  Resp: 20 (!) 24 (!) 21 19  Temp:    98.3 F (36.8 C)  TempSrc:    Oral  SpO2: 98% 99% 98% 100%  Weight:      Height:       Eyes: PERRL, lids and conjunctivae normal ENMT: Mucous membranes are moist. Posterior pharynx clear of any exudate or lesions. Cracke tooth, left upper Neck: normal, supple, no lymphadenopathy Respiratory: clear to auscultation bilaterally, no wheezing, no crackles. Normal respiratory effort. Cardiovascular: Tachycardic. 3/6 crescendo-decrescendo systolic murmur at RUSB with 3/6 decrescendo diastolic murmur at the LLSB. No chest wall tenderness. Abdomen: no tenderness, no masses palpated. No hepatosplenomegaly. Bowel sounds positive.  Musculoskeletal: no clubbing / cyanosis. No joint deformity upper and lower extremities. No synovitis. CVA tenderness on right. No spinous process tenderness.  Skin: no rashes, lesions, ulcers. No induration Neurologic: CN 2-12 grossly intact. Sensation intact. Strength 5/5 in all 4.  Psychiatric: Normal judgment and insight. Alert and oriented x 3. Normal mood.   Labs on Admission: I have personally reviewed following labs and imaging studies  CBC: Recent Labs  Lab 05/18/17 0454 05/19/17 0513 05/24/17 1433  WBC 8.4 7.4 12.3*  HGB 9.1* 9.0* 10.1*  HCT 28.9* 28.8* 32.2*  MCV 81.6 81.6 81.9  PLT 220 250 641   Basic Metabolic Panel: Recent Labs  Lab 05/18/17 0454 05/19/17 0513 05/24/17 1433  NA 135 137 133*  K 3.4* 4.1 3.5  CL 102 104 103  CO2 _0 GLUCOSE 92 89 97  BUN _1 CREATININE 0.65 0.65 0.63  CALCIUM 8.3* 8.4* 8.7*  MG  --  1.9  --    GFR: Estimated Creatinine Clearance: 88.5 mL/min (by C-G formula based on SCr of 0.63 mg/dL). Liver Function Tests: Recent Labs  Lab 05/24/17 1433  AST 15  ALT 10*  ALKPHOS 64  BILITOT 0.5   PROT 7.4  ALBUMIN 2.8*   No results for input(s): LIPASE, AMYLASE in the last 168 hours. No results for input(s): AMMONIA in the last 168 hours. Coagulation Profile: No results for input(s): INR, PROTIME in the last 168 hours. Cardiac Enzymes: No results for input(s): CKTOTAL, CKMB, CKMBINDEX, TROPONINI in the last 168 hours. BNP (last 3 results) No results for input(s): PROBNP in the last 8760 hours. HbA1C: No results for input(s): HGBA1C in the last 72 hours. CBG: No results for input(s): GLUCAP in the last 168 hours. Lipid Profile: No results for input(s): CHOL, HDL, LDLCALC, TRIG, CHOLHDL, LDLDIRECT in the last 72 hours. Thyroid Function Tests: No results for input(s): TSH, T4TOTAL, FREET4, T3FREE, THYROIDAB in the last 72 hours. Anemia Panel: No results for input(s): VITAMINB12, FOLATE, FERRITIN, TIBC, IRON, RETICCTPCT in the last 72 hours. Urine analysis:    Component Value Date/Time   COLORURINE YELLOW 05/24/2017 1806  APPEARANCEUR HAZY (A) 05/24/2017 1806   LABSPEC 1.024 05/24/2017 1806   PHURINE 6.0 05/24/2017 1806   GLUCOSEU NEGATIVE 05/24/2017 1806   HGBUR SMALL (A) 05/24/2017 1806   BILIRUBINUR NEGATIVE 05/24/2017 1806   KETONESUR NEGATIVE 05/24/2017 1806   PROTEINUR NEGATIVE 05/24/2017 1806   UROBILINOGEN 1.0 07/22/2013 1727   NITRITE NEGATIVE 05/24/2017 1806   LEUKOCYTESUR NEGATIVE 05/24/2017 1806    Radiological Exams on Admission: Dg Chest 2 View  Result Date: 05/24/2017 CLINICAL DATA:  Chest pain EXAM: CHEST  2 VIEW COMPARISON:  May 08, 2017 FINDINGS: Heart is mildly enlarged with pulmonary vascularity within normal limits. No edema or consolidation. No adenopathy. No evident bone lesions. IMPRESSION: Mild cardiac enlargement.  No edema or consolidation. Electronically Signed   By: Lowella Grip III M.D.   On: 05/24/2017 14:38   Ct Angio Chest Pe W And/or Wo Contrast  Result Date: 05/24/2017 CLINICAL DATA:  Right-sided chest pain radiating  to the back for 2 days. Dyspnea. EXAM: CT ANGIOGRAPHY CHEST WITH CONTRAST TECHNIQUE: Multidetector CT imaging of the chest was performed using the standard protocol during bolus administration of intravenous contrast. Multiplanar CT image reconstructions and MIPs were obtained to evaluate the vascular anatomy. CONTRAST:  155m ISOVUE-370 IOPAMIDOL (ISOVUE-370) INJECTION 76% COMPARISON:  Radiographs 05/24/2017 at 14:31 FINDINGS: Cardiovascular: Satisfactory opacification of the pulmonary arteries to the segmental level. No evidence of pulmonary embolism. Normal heart size. No pericardial effusion. The thoracic aorta is normal in caliber and intact. Mediastinum/Nodes: No enlarged mediastinal, hilar, or axillary lymph nodes. Thyroid gland, trachea, and esophagus demonstrate no significant findings. Lungs/Pleura: Lungs are clear. No pleural effusion or pneumothorax. Upper Abdomen: No acute abnormality. Musculoskeletal: No chest wall abnormality. No acute or significant osseous findings. Review of the MIP images confirms the above findings. IMPRESSION: Negative for pulmonary embolism.  No significant abnormality. Electronically Signed   By: DAndreas NewportM.D.   On: 05/24/2017 20:32    EKG: Independently reviewed. Tachycardic. Right axis deviation.  Assessment/Plan Active Problems:   Sepsis ([place]  Fever of unknown origin, suspect endocarditis New systolic and diastolic heart murmurs 1 month of fever, chills, night sweats and other constitutional symptoms. New heart murmurs raise concern for endocarditis. Inflammatory markers fairly elevated during last hospitalization as well. Would also be prudent to work up common causes of FUO; HIV, thyroid disease, autoimmune disease, malignancy, abscess, etc. Patient meets sepsis criteria with leukocytosis and tachycardia. Urine unremarkable, however would also rule out renal abscess. - Obtain TTE - Will hold on empiric antibiotics pending result of TTE as no clear  source - Follow up blood cultures - Obtain influenza PCR - Check ESR, CRP, HIV, TSH, hepatitis panel - Obtain renal U/S - Trend fever curve, daily CBC  Flank pain, recent UTI Bacterial vaginosis - Agree with ceftriaxone dose in ED, defer continuation to primary team pending renal U/S results - Continue metronidazole through 12/15  Normocytic anemia - Iron profile, ferritin, B12, folate, retics  DVT prophylaxis: Lovenox Code Status: Full Disposition Plan: Home in 1-2 days Consults called: Cardiology Admission status: Obs   ABennie PieriniMD [facility]  If 7PM-7AM, please contact night-coverage [email or web address] Password [email or web address]  05/25/2017, 1:00 AM

## 2017-05-25 NOTE — Consult Note (Signed)
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 Stop anbx 2. Check TEE to get better views of Ao valve 3. Check BCx daily for 3 days, off anbx.  4. Check acute hepatitis panel 5. Check RPR   Comment-  By [facility] Criteria she has 1 major (valve lesion) and 1 minor (fever) criteria for IE. That gives her the diagnosis of "possible endocarditis". Her previous BCx were done on anbx (ceftriaxone given 1 h prior), her most recent set were done off anbx.  Explained this to her and that E coli is a very uncommon cause of endocarditis (relative to staph or strep).  She needs further eval.  If she is doing well, she can complete this eval as an outpt.   ID available as needed over the weekend.   Thank you so much for this interesting consult,  Active Problems:   Sepsis ([place])   . enoxaparin (LOVENOX) injection  40 mg Subcutaneous Q24H  . metroNIDAZOLE  500 mg Oral Q12H    HPI: Krystal Shaffer is a 41 y.o. female adm 12-6 to 12-8 after being dx with E coli UTI as an out pt. She developed fever up to 102 despite being treated with keflex and was admitted. In hospital, her BCx were negative and she was treated with ceftriaxone. She was d/c home on cipro (and flagyl for BV).   She returns 12-13 with fever and chills, night sweats. Her WBC was 12.3 and she was afebrile. She was started on vanco/ceftriaxone. She was noted to have a new murmur as well.  She had a TTE that showed a 1x1 cm mass on Ao valve (infection vs fibroelastoma).     Review of Systems: Review of Systems  Constitutional: Positive for chills, fever and weight loss.  HENT: Negative for congestion.   Eyes: Negative for blurred vision.  Respiratory: Positive for  cough. Negative for sputum production and shortness of breath.   Gastrointestinal: Negative for constipation and diarrhea.  Genitourinary: Negative for dysuria and frequency.  Musculoskeletal: Positive for myalgias.  40# unintentional weight loss.  Please see HPI. All other systems reviewed and negative.   Past Medical History:  Diagnosis Date  . Cystitis 05/2017  . Family history of adverse reaction to anesthesia    " mother has a hard time waking "  . Migraine   . Migraines     Social History   Tobacco Use  . Smoking status: Never Smoker  . Smokeless tobacco: Never Used  Substance Use Topics  . Alcohol use: No  . Drug use: No    Family History  Problem Relation Age of Onset  . Obesity Mother   . Hypertension Sister      Medications:  Scheduled: . enoxaparin (LOVENOX) injection  40 mg Subcutaneous Q24H  . metroNIDAZOLE  500 mg Oral Q12H    Abtx:  Anti-infectives (From admission, onward)   Start     Dose/Rate Route Frequency Ordered Stop   05/25/17 0026  metroNIDAZOLE (FLAGYL) tablet 500 mg     500 mg Oral Every 12 hours 05/25/17 0026 05/27/17 2159   05/24/17 2300  cefTRIAXone (ROCEPHIN) 1  g in dextrose 5 % 50 mL IVPB     1 g 100 mL/hr over 30 Minutes Intravenous  Once 05/24/17 2247 05/24/17 2349   05/24/17 2300  vancomycin (VANCOCIN) IVPB 1000 mg/200 mL premix     1,000 mg 200 mL/hr over 60 Minutes Intravenous  Once 05/24/17 2247 05/25/17 0018        OBJECTIVE: Blood pressure (!) 135/49, pulse 91, temperature 97.8 F (36.6 C), temperature source Oral, resp. rate 16, height '5\' 4"'$  (1.626 m), weight 68 kg (150 lb), SpO2 100 %.  Physical Exam  Constitutional: She is well-developed, well-nourished, and in no distress. No distress.  HENT:  Mouth/Throat: No oropharyngeal exudate.  Eyes: EOM are normal. Pupils are equal, round, and reactive to light.  Neck: Neck supple.  Cardiovascular: Normal rate and regular rhythm.  Murmur heard. Pulmonary/Chest: Effort  normal and breath sounds normal.  Abdominal: Soft. Bowel sounds are normal. There is no tenderness. There is no rebound.  Musculoskeletal: She exhibits no edema, tenderness or deformity.  Lymphadenopathy:    She has no cervical adenopathy.  Skin: Skin is warm and dry. No rash noted. She is not diaphoretic.  Psychiatric: Mood, affect and judgment normal.    Lab Results Results for orders placed or performed during the hospital encounter of 05/24/17 (from the past 48 hour(s))  Basic metabolic panel     Status: Abnormal   Collection Time: 05/24/17  2:33 PM  Result Value Ref Range   Sodium 133 (L) 135 - 145 mmol/L   Potassium 3.5 3.5 - 5.1 mmol/L   Chloride 103 101 - 111 mmol/L   CO2 22 22 - 32 mmol/L   Glucose, Bld 97 65 - 99 mg/dL   BUN 7 6 - 20 mg/dL   Creatinine, Ser 0.63 0.44 - 1.00 mg/dL   Calcium 8.7 (L) 8.9 - 10.3 mg/dL   GFR calc non Af Amer >60 >60 mL/min   GFR calc Af Amer >60 >60 mL/min    Comment: (NOTE) The eGFR has been calculated using the CKD EPI equation. This calculation has not been validated in all clinical situations. eGFR's persistently <60 mL/min signify possible Chronic Kidney Disease.    Anion gap 8 5 - 15  CBC     Status: Abnormal   Collection Time: 05/24/17  2:33 PM  Result Value Ref Range   WBC 12.3 (H) 4.0 - 10.5 K/uL   RBC 3.93 3.87 - 5.11 MIL/uL   Hemoglobin 10.1 (L) 12.0 - 15.0 g/dL   HCT 32.2 (L) 36.0 - 46.0 %   MCV 81.9 78.0 - 100.0 fL   MCH 25.7 (L) 26.0 - 34.0 pg   MCHC 31.4 30.0 - 36.0 g/dL   RDW 15.1 11.5 - 15.5 %   Platelets 298 150 - 400 K/uL  Hepatic function panel     Status: Abnormal   Collection Time: 05/24/17  2:33 PM  Result Value Ref Range   Total Protein 7.4 6.5 - 8.1 g/dL   Albumin 2.8 (L) 3.5 - 5.0 g/dL   AST 15 15 - 41 U/L   ALT 10 (L) 14 - 54 U/L   Alkaline Phosphatase 64 38 - 126 U/L   Total Bilirubin 0.5 0.3 - 1.2 mg/dL   Bilirubin, Direct <0.1 (L) 0.1 - 0.5 mg/dL   Indirect Bilirubin NOT CALCULATED 0.3 - 0.9  mg/dL  I-stat troponin, ED     Status: None   Collection Time: 05/24/17  2:50 PM  Result Value Ref Range  Troponin i, poc 0.00 0.00 - 0.08 ng/mL   Comment 3            Comment: Due to the release kinetics of cTnI, a negative result within the first hours of the onset of symptoms does not rule out myocardial infarction with certainty. If myocardial infarction is still suspected, repeat the test at appropriate intervals.   I-Stat beta hCG blood, ED     Status: None   Collection Time: 05/24/17  2:50 PM  Result Value Ref Range   I-stat hCG, quantitative <5.0 <5 mIU/mL   Comment 3            Comment:   GEST. AGE      CONC.  (mIU/mL)   <=1 WEEK        5 - 50     2 WEEKS       50 - 500     3 WEEKS       100 - 10,000     4 WEEKS     1,000 - 30,000        FEMALE AND NON-PREGNANT FEMALE:     LESS THAN 5 mIU/mL   I-Stat CG4 Lactic Acid, ED     Status: None   Collection Time: 05/24/17  2:52 PM  Result Value Ref Range   Lactic Acid, Venous 1.35 0.5 - 1.9 mmol/L  D-dimer, quantitative (not at [facility])     Status: Abnormal   Collection Time: 05/24/17  5:31 PM  Result Value Ref Range   D-Dimer, Quant 0.51 (H) 0.00 - 0.50 ug/mL-FEU    Comment: (NOTE) At the manufacturer cut-off of 0.50 ug/mL FEU, this assay has been documented to exclude PE with a sensitivity and negative predictive value of 97 to 99%.  At this time, this assay has not been approved by the FDA to exclude DVT/VTE. Results should be correlated with clinical presentation.   Urinalysis, Routine w reflex microscopic     Status: Abnormal   Collection Time: 05/24/17  6:06 PM  Result Value Ref Range   Color, Urine YELLOW YELLOW   APPearance HAZY (A) CLEAR   Specific Gravity, Urine 1.024 1.005 - 1.030   pH 6.0 5.0 - 8.0   Glucose, UA NEGATIVE NEGATIVE mg/dL   Hgb urine dipstick SMALL (A) NEGATIVE   Bilirubin Urine NEGATIVE NEGATIVE   Ketones, ur NEGATIVE NEGATIVE mg/dL   Protein, ur NEGATIVE NEGATIVE mg/dL   Nitrite NEGATIVE  NEGATIVE   Leukocytes, UA NEGATIVE NEGATIVE   RBC / HPF 6-30 0 - 5 RBC/hpf   WBC, UA 0-5 0 - 5 WBC/hpf   Bacteria, UA RARE (A) NONE SEEN   Squamous Epithelial / LPF 0-5 (A) NONE SEEN   Mucus PRESENT    Hyaline Casts, UA PRESENT   I-Stat CG4 Lactic Acid, ED     Status: None   Collection Time: 05/24/17  6:11 PM  Result Value Ref Range   Lactic Acid, Venous 1.18 0.5 - 1.9 mmol/L  I-stat troponin, ED     Status: None   Collection Time: 05/24/17 10:58 PM  Result Value Ref Range   Troponin i, poc 0.00 0.00 - 0.08 ng/mL   Comment 3            Comment: Due to the release kinetics of cTnI, a negative result within the first hours of the onset of symptoms does not rule out myocardial infarction with certainty. If myocardial infarction is still suspected, repeat the test at appropriate intervals.  Culture, blood (routine x 2)     Status: None (Preliminary result)   Collection Time: 05/24/17 11:05 PM  Result Value Ref Range   Specimen Description BLOOD RIGHT ANTECUBITAL    Special Requests      BOTTLES DRAWN AEROBIC AND ANAEROBIC Blood Culture adequate volume   Culture NO GROWTH < 12 HOURS    Report Status PENDING   Sedimentation rate     Status: Abnormal   Collection Time: 05/25/17 12:34 AM  Result Value Ref Range   Sed Rate 77 (H) 0 - 22 mm/hr  C-reactive protein     Status: Abnormal   Collection Time: 05/25/17 12:34 AM  Result Value Ref Range   CRP 8.5 (H) <1.0 mg/dL  Iron and TIBC     Status: Abnormal   Collection Time: 05/25/17 12:34 AM  Result Value Ref Range   Iron 21 (L) 28 - 170 ug/dL   TIBC 183 (L) 250 - 450 ug/dL   Saturation Ratios 11 10.4 - 31.8 %   UIBC 162 ug/dL  Ferritin     Status: None   Collection Time: 05/25/17 12:34 AM  Result Value Ref Range   Ferritin 56 11 - 307 ng/mL  Vitamin B12     Status: None   Collection Time: 05/25/17 12:34 AM  Result Value Ref Range   Vitamin B-12 417 180 - 914 pg/mL    Comment: (NOTE) This assay is not validated for  testing neonatal or myeloproliferative syndrome specimens for Vitamin B12 levels.   Reticulocytes     Status: Abnormal   Collection Time: 05/25/17 12:34 AM  Result Value Ref Range   Retic Ct Pct 3.3 (H) 0.4 - 3.1 %   RBC. 3.39 (L) 3.87 - 5.11 MIL/uL   Retic Count, Absolute 111.9 19.0 - 186.0 K/uL  Magnesium     Status: Abnormal   Collection Time: 05/25/17 12:34 AM  Result Value Ref Range   Magnesium 1.6 (L) 1.7 - 2.4 mg/dL  Phosphorus     Status: None   Collection Time: 05/25/17 12:34 AM  Result Value Ref Range   Phosphorus 3.1 2.5 - 4.6 mg/dL  TSH     Status: None   Collection Time: 05/25/17 12:34 AM  Result Value Ref Range   TSH 3.337 0.350 - 4.500 uIU/mL    Comment: Performed by a 3rd Generation assay with a functional sensitivity of <=0.01 uIU/mL.  Differential     Status: None   Collection Time: 05/25/17 12:34 AM  Result Value Ref Range   Neutrophils Relative % 70 %   Neutro Abs 5.4 1.7 - 7.7 K/uL   Lymphocytes Relative 25 %   Lymphs Abs 1.9 0.7 - 4.0 K/uL   Monocytes Relative 4 %   Monocytes Absolute 0.3 0.1 - 1.0 K/uL   Eosinophils Relative 1 %   Eosinophils Absolute 0.1 0.0 - 0.7 K/uL   Basophils Relative 0 %   Basophils Absolute 0.0 0.0 - 0.1 K/uL  HIV antibody     Status: None   Collection Time: 05/25/17 12:34 AM  Result Value Ref Range   HIV Screen 4th Generation wRfx Non Reactive Non Reactive    Comment: (NOTE) Performed At: [facility] [place], [place] [id number] Rush Farmer MD QI:[id number]   Influenza panel by PCR (type A & B)     Status: None   Collection Time: 05/25/17  3:47 AM  Result Value Ref Range   Influenza A By PCR NEGATIVE NEGATIVE   Influenza  B By PCR NEGATIVE NEGATIVE    Comment: (NOTE) The Xpert Xpress Flu assay is intended as an aid in the diagnosis of  influenza and should not be used as a sole basis for treatment.  This  assay is FDA approved for nasopharyngeal swab specimens only. Nasal  washings and  aspirates are unacceptable for Xpert Xpress Flu testing.   Basic metabolic panel     Status: Abnormal   Collection Time: 05/25/17  5:36 AM  Result Value Ref Range   Sodium 134 (L) 135 - 145 mmol/L   Potassium 3.7 3.5 - 5.1 mmol/L   Chloride 105 101 - 111 mmol/L   CO2 24 22 - 32 mmol/L   Glucose, Bld 91 65 - 99 mg/dL   BUN 7 6 - 20 mg/dL   Creatinine, Ser 0.60 0.44 - 1.00 mg/dL   Calcium 7.8 (L) 8.9 - 10.3 mg/dL   GFR calc non Af Amer >60 >60 mL/min   GFR calc Af Amer >60 >60 mL/min    Comment: (NOTE) The eGFR has been calculated using the CKD EPI equation. This calculation has not been validated in all clinical situations. eGFR's persistently <60 mL/min signify possible Chronic Kidney Disease.    Anion gap 5 5 - 15  CBC     Status: Abnormal   Collection Time: 05/25/17  5:36 AM  Result Value Ref Range   WBC 6.8 4.0 - 10.5 K/uL   RBC 3.47 (L) 3.87 - 5.11 MIL/uL   Hemoglobin 8.8 (L) 12.0 - 15.0 g/dL   HCT 28.7 (L) 36.0 - 46.0 %   MCV 82.7 78.0 - 100.0 fL   MCH 25.4 (L) 26.0 - 34.0 pg   MCHC 30.7 30.0 - 36.0 g/dL   RDW 15.2 11.5 - 15.5 %   Platelets 254 150 - 400 K/uL      Component Value Date/Time   SDES BLOOD RIGHT ANTECUBITAL 05/24/2017 2305   SPECREQUEST  05/24/2017 2305    BOTTLES DRAWN AEROBIC AND ANAEROBIC Blood Culture adequate volume   CULT NO GROWTH < 12 HOURS 05/24/2017 2305   REPTSTATUS PENDING 05/24/2017 2305   Dg Chest 2 View  Result Date: 05/24/2017 CLINICAL DATA:  Chest pain EXAM: CHEST  2 VIEW COMPARISON:  May 08, 2017 FINDINGS: Heart is mildly enlarged with pulmonary vascularity within normal limits. No edema or consolidation. No adenopathy. No evident bone lesions. IMPRESSION: Mild cardiac enlargement.  No edema or consolidation. Electronically Signed   By: Lowella Grip III M.D.   On: 05/24/2017 14:38   Ct Angio Chest Pe W And/or Wo Contrast  Result Date: 05/24/2017 CLINICAL DATA:  Right-sided chest pain radiating to the back for 2 days.  Dyspnea. EXAM: CT ANGIOGRAPHY CHEST WITH CONTRAST TECHNIQUE: Multidetector CT imaging of the chest was performed using the standard protocol during bolus administration of intravenous contrast. Multiplanar CT image reconstructions and MIPs were obtained to evaluate the vascular anatomy. CONTRAST:  145m ISOVUE-370 IOPAMIDOL (ISOVUE-370) INJECTION 76% COMPARISON:  Radiographs 05/24/2017 at 14:31 FINDINGS: Cardiovascular: Satisfactory opacification of the pulmonary arteries to the segmental level. No evidence of pulmonary embolism. Normal heart size. No pericardial effusion. The thoracic aorta is normal in caliber and intact. Mediastinum/Nodes: No enlarged mediastinal, hilar, or axillary lymph nodes. Thyroid gland, trachea, and esophagus demonstrate no significant findings. Lungs/Pleura: Lungs are clear. No pleural effusion or pneumothorax. Upper Abdomen: No acute abnormality. Musculoskeletal: No chest wall abnormality. No acute or significant osseous findings. Review of the MIP images confirms the above findings. IMPRESSION: Negative  for pulmonary embolism.  No significant abnormality. Electronically Signed   By: Andreas Newport M.D.   On: 05/24/2017 20:32   US Renal  Result Date: 05/25/2017 CLINICAL DATA:  Pyelonephritis. EXAM: RENAL / URINARY TRACT ULTRASOUND COMPLETE COMPARISON:  Ultrasound 05/18/2017.  CT 05/08/2017. FINDINGS: Right Kidney: Length: 13.0 cm. Echogenicity within normal limits. No mass or hydronephrosis visualized. Left Kidney: Length: 13.4 cm. Echogenicity within normal limits. No mass or hydronephrosis visualized. Bladder: Appears normal for degree of bladder distention. 4.6 cm cyst left lower pelvis, most likely left ovarian. This is increased slightly in size from prior exam. Two internal echoes are noted on today's exam. This may represent a hemorrhagic cyst. IMPRESSION: 1. No acute renal abnormality. No hydronephrosis or bladder distention. 2. 4.6 cm cyst left lower pelvis. This is most  likely ovarian in origin. This has increased slightly in size from prior exam. Faint internal echoes are noted within the cyst on today's exam. This this cyst may be a hemorrhagic cyst. Pregnancy test to exclude ectopic pregnancy suggested. Short-interval follow up ultrasound in 6-12 weeks is recommended, preferably during the week following the patient's normal menses. Electronically Signed   By: Marcello Moores  Register   On: 05/25/2017 12:25   Recent Results (from the past 240 hour(s))  Blood culture (routine x 2)     Status: None   Collection Time: 05/17/17  8:55 PM  Result Value Ref Range Status   Specimen Description BLOOD LEFT ANTECUBITAL  Final   Special Requests   Final    BOTTLES DRAWN AEROBIC AND ANAEROBIC Blood Culture adequate volume   Culture NO GROWTH 5 DAYS  Final   Report Status 05/22/2017 FINAL  Final  Blood culture (routine x 2)     Status: None   Collection Time: 05/17/17  9:05 PM  Result Value Ref Range Status   Specimen Description BLOOD RIGHT ARM  Final   Special Requests   Final    BOTTLES DRAWN AEROBIC AND ANAEROBIC Blood Culture adequate volume   Culture NO GROWTH 5 DAYS  Final   Report Status 05/22/2017 FINAL  Final  Wet prep, genital     Status: Abnormal   Collection Time: 05/17/17 10:07 PM  Result Value Ref Range Status   Yeast Wet Prep HPF POC NONE SEEN NONE SEEN Final   Trich, Wet Prep NONE SEEN NONE SEEN Final   Clue Cells Wet Prep HPF POC PRESENT (A) NONE SEEN Final   WBC, Wet Prep HPF POC FEW (A) NONE SEEN Final   Sperm NONE SEEN  Final  Culture, blood (routine x 2)     Status: None (Preliminary result)   Collection Time: 05/24/17 11:05 PM  Result Value Ref Range Status   Specimen Description BLOOD RIGHT ANTECUBITAL  Final   Special Requests   Final    BOTTLES DRAWN AEROBIC AND ANAEROBIC Blood Culture adequate volume   Culture NO GROWTH < 12 HOURS  Final   Report Status PENDING  Incomplete    Microbiology: Recent Results (from the past 240 hour(s))    Blood culture (routine x 2)     Status: None   Collection Time: 05/17/17  8:55 PM  Result Value Ref Range Status   Specimen Description BLOOD LEFT ANTECUBITAL  Final   Special Requests   Final    BOTTLES DRAWN AEROBIC AND ANAEROBIC Blood Culture adequate volume   Culture NO GROWTH 5 DAYS  Final   Report Status 05/22/2017 FINAL  Final  Blood culture (routine x 2)  Status: None   Collection Time: 05/17/17  9:05 PM  Result Value Ref Range Status   Specimen Description BLOOD RIGHT ARM  Final   Special Requests   Final    BOTTLES DRAWN AEROBIC AND ANAEROBIC Blood Culture adequate volume   Culture NO GROWTH 5 DAYS  Final   Report Status 05/22/2017 FINAL  Final  Wet prep, genital     Status: Abnormal   Collection Time: 05/17/17 10:07 PM  Result Value Ref Range Status   Yeast Wet Prep HPF POC NONE SEEN NONE SEEN Final   Trich, Wet Prep NONE SEEN NONE SEEN Final   Clue Cells Wet Prep HPF POC PRESENT (A) NONE SEEN Final   WBC, Wet Prep HPF POC FEW (A) NONE SEEN Final   Sperm NONE SEEN  Final  Culture, blood (routine x 2)     Status: None (Preliminary result)   Collection Time: 05/24/17 11:05 PM  Result Value Ref Range Status   Specimen Description BLOOD RIGHT ANTECUBITAL  Final   Special Requests   Final    BOTTLES DRAWN AEROBIC AND ANAEROBIC Blood Culture adequate volume   Culture NO GROWTH < 12 HOURS  Final   Report Status PENDING  Incomplete    Radiographs and labs were personally reviewed by me.   Bobby Rumpf, MD [facility] [phone] 05/25/2017, 2:44 PM

## 2017-05-26 ENCOUNTER — Other Ambulatory Visit: Payer: Self-pay

## 2017-05-26 DIAGNOSIS — Z6825 Body mass index (BMI) 25.0-25.9, adult: Secondary | ICD-10-CM | POA: Diagnosis not present

## 2017-05-26 DIAGNOSIS — Z01810 Encounter for preprocedural cardiovascular examination: Secondary | ICD-10-CM | POA: Diagnosis not present

## 2017-05-26 DIAGNOSIS — D649 Anemia, unspecified: Secondary | ICD-10-CM | POA: Diagnosis not present

## 2017-05-26 DIAGNOSIS — R519 Headache, unspecified: Secondary | ICD-10-CM

## 2017-05-26 DIAGNOSIS — R7881 Bacteremia: Secondary | ICD-10-CM | POA: Diagnosis present

## 2017-05-26 DIAGNOSIS — I350 Nonrheumatic aortic (valve) stenosis: Secondary | ICD-10-CM | POA: Diagnosis not present

## 2017-05-26 DIAGNOSIS — M109 Gout, unspecified: Secondary | ICD-10-CM | POA: Diagnosis present

## 2017-05-26 DIAGNOSIS — B9689 Other specified bacterial agents as the cause of diseases classified elsewhere: Secondary | ICD-10-CM

## 2017-05-26 DIAGNOSIS — F329 Major depressive disorder, single episode, unspecified: Secondary | ICD-10-CM

## 2017-05-26 DIAGNOSIS — R011 Cardiac murmur, unspecified: Secondary | ICD-10-CM | POA: Diagnosis not present

## 2017-05-26 DIAGNOSIS — R0789 Other chest pain: Secondary | ICD-10-CM | POA: Diagnosis not present

## 2017-05-26 DIAGNOSIS — K045 Chronic apical periodontitis: Secondary | ICD-10-CM | POA: Diagnosis present

## 2017-05-26 DIAGNOSIS — I38 Endocarditis, valve unspecified: Secondary | ICD-10-CM | POA: Diagnosis present

## 2017-05-26 DIAGNOSIS — A409 Streptococcal sepsis, unspecified: Secondary | ICD-10-CM

## 2017-05-26 DIAGNOSIS — I351 Nonrheumatic aortic (valve) insufficiency: Secondary | ICD-10-CM | POA: Diagnosis not present

## 2017-05-26 DIAGNOSIS — I339 Acute and subacute endocarditis, unspecified: Secondary | ICD-10-CM

## 2017-05-26 DIAGNOSIS — N760 Acute vaginitis: Secondary | ICD-10-CM

## 2017-05-26 DIAGNOSIS — I330 Acute and subacute infective endocarditis: Secondary | ICD-10-CM | POA: Diagnosis not present

## 2017-05-26 DIAGNOSIS — I358 Other nonrheumatic aortic valve disorders: Secondary | ICD-10-CM

## 2017-05-26 DIAGNOSIS — R51 Headache: Secondary | ICD-10-CM

## 2017-05-26 DIAGNOSIS — M264 Malocclusion, unspecified: Secondary | ICD-10-CM | POA: Diagnosis present

## 2017-05-26 DIAGNOSIS — Z87440 Personal history of urinary (tract) infections: Secondary | ICD-10-CM | POA: Diagnosis not present

## 2017-05-26 DIAGNOSIS — K083 Retained dental root: Secondary | ICD-10-CM | POA: Diagnosis present

## 2017-05-26 DIAGNOSIS — R079 Chest pain, unspecified: Secondary | ICD-10-CM

## 2017-05-26 DIAGNOSIS — R195 Other fecal abnormalities: Secondary | ICD-10-CM | POA: Diagnosis present

## 2017-05-26 DIAGNOSIS — B373 Candidiasis of vulva and vagina: Secondary | ICD-10-CM | POA: Diagnosis present

## 2017-05-26 DIAGNOSIS — Z792 Long term (current) use of antibiotics: Secondary | ICD-10-CM | POA: Diagnosis not present

## 2017-05-26 DIAGNOSIS — D638 Anemia in other chronic diseases classified elsewhere: Secondary | ICD-10-CM | POA: Diagnosis present

## 2017-05-26 DIAGNOSIS — G43909 Migraine, unspecified, not intractable, without status migrainosus: Secondary | ICD-10-CM | POA: Diagnosis present

## 2017-05-26 DIAGNOSIS — I352 Nonrheumatic aortic (valve) stenosis with insufficiency: Secondary | ICD-10-CM | POA: Diagnosis present

## 2017-05-26 DIAGNOSIS — F40232 Fear of other medical care: Secondary | ICD-10-CM | POA: Diagnosis present

## 2017-05-26 DIAGNOSIS — A408 Other streptococcal sepsis: Secondary | ICD-10-CM | POA: Diagnosis present

## 2017-05-26 DIAGNOSIS — B955 Unspecified streptococcus as the cause of diseases classified elsewhere: Secondary | ICD-10-CM | POA: Diagnosis not present

## 2017-05-26 DIAGNOSIS — D62 Acute posthemorrhagic anemia: Secondary | ICD-10-CM | POA: Diagnosis not present

## 2017-05-26 DIAGNOSIS — E8770 Fluid overload, unspecified: Secondary | ICD-10-CM | POA: Diagnosis not present

## 2017-05-26 DIAGNOSIS — K219 Gastro-esophageal reflux disease without esophagitis: Secondary | ICD-10-CM | POA: Diagnosis present

## 2017-05-26 DIAGNOSIS — K029 Dental caries, unspecified: Secondary | ICD-10-CM | POA: Diagnosis present

## 2017-05-26 DIAGNOSIS — B954 Other streptococcus as the cause of diseases classified elsewhere: Secondary | ICD-10-CM | POA: Diagnosis not present

## 2017-05-26 DIAGNOSIS — I10 Essential (primary) hypertension: Secondary | ICD-10-CM | POA: Diagnosis present

## 2017-05-26 DIAGNOSIS — Z8249 Family history of ischemic heart disease and other diseases of the circulatory system: Secondary | ICD-10-CM | POA: Diagnosis not present

## 2017-05-26 DIAGNOSIS — E876 Hypokalemia: Secondary | ICD-10-CM | POA: Diagnosis not present

## 2017-05-26 DIAGNOSIS — K036 Deposits [accretions] on teeth: Secondary | ICD-10-CM | POA: Diagnosis present

## 2017-05-26 DIAGNOSIS — R634 Abnormal weight loss: Secondary | ICD-10-CM

## 2017-05-26 DIAGNOSIS — K224 Dyskinesia of esophagus: Secondary | ICD-10-CM | POA: Diagnosis present

## 2017-05-26 DIAGNOSIS — K047 Periapical abscess without sinus: Secondary | ICD-10-CM | POA: Diagnosis present

## 2017-05-26 DIAGNOSIS — I340 Nonrheumatic mitral (valve) insufficiency: Secondary | ICD-10-CM | POA: Diagnosis not present

## 2017-05-26 LAB — URINE CULTURE: Culture: NO GROWTH

## 2017-05-26 LAB — HEPATITIS PANEL, ACUTE
HCV Ab: <0.1 s/co ratio (ref 0.0–0.9)
HEP A IGM: NEGATIVE
HEP B C IGM: NEGATIVE
HEP B C IGM: NEGATIVE
Hep A IgM: NEGATIVE
Hepatitis B Surface Ag: NEGATIVE
Hepatitis B Surface Ag: NEGATIVE

## 2017-05-26 MED ORDER — GI COCKTAIL ~~LOC~~
30.0000 mL | Freq: Three times a day (TID) | ORAL | Status: DC | PRN
  Administered 2017-05-26 – 2017-05-28 (×4): 30 mL via ORAL
  Filled 2017-05-26 (×9): qty 30

## 2017-05-26 MED ORDER — PANTOPRAZOLE SODIUM 40 MG PO TBEC
40.0000 mg | DELAYED_RELEASE_TABLET | Freq: Every day | ORAL | Status: DC
  Administered 2017-05-26 – 2017-05-27 (×2): 40 mg via ORAL
  Filled 2017-05-26 (×2): qty 1

## 2017-05-26 NOTE — Progress Notes (Signed)
PROGRESS NOTE    Krystal Shaffer  IOX:[id number] DOB: 1976-03-07 DOA: 05/24/2017 PCP: Sofie Rower, PA-C   Brief Narrative: Krystal Shaffer is a 41 y.o. female with medical history significant for recent E. coli UTI with failed outpatient management requiring short inpatient stay for antibiotic therapy who presents to the ED with continued fever, chills, night sweats, myalgias. She presented with sepsis concerning for endocarditis with new murmr   Assessment & Plan:   Principal Problem:   Sepsis ([place]) Active Problems:   Bacteremia due to Streptococcus   Aortic valve vegetation   Diastolic murmur   Bacterial vaginosis   Atypical chest pain   Temporal headache   Streptococcal sepsis ([facility])   Sepsis Streptococcal bacteremia BCID positive for strep. 2/2 cultures (12/13) positive. Ceftriaxone restarted overnight. -Continue ceftriaxone -blood culture (12/13 and 12/15)/urine culture pending  Aortic valve vegetation New. Concern for endocarditis. -TEE  History of UTI Flank pain Patient with under-treated UTI secondary to non-adherence secondary to nausea and vomiting.  -Urine culture pending  Bacterial vaginosis -Continue metronidazole  Normocytic anemia Iron studies in line with anemia of chronic disease  Headache Chronic. -Continue morphine prn  Atypical chest pain Likely reflux. -Protonix -GI cocktail prn   DVT prophylaxis: Lovenox Code Status: Full code Family Communication: None at bedside Disposition Plan: Discharge pending workup   Consultants:   None  Procedures:   Echocardiogram (12/14) Study Conclusions  - Left ventricle: The cavity size was normal. Systolic function was   normal. The estimated ejection fraction was in the range of 60%   to 65%. Wall motion was normal; there were no regional wall   motion abnormalities. Doppler parameters are consistent with   abnormal left ventricular relaxation (grade 1 diastolic    dysfunction). - Aortic valve: Trileaflet; moderately thickened leaflets with 1cm   x 1cm mass on the right coronary cusp, mild mobility, suggestive   of vegetation. If no signs of infection, consider fibroelastoma.   There was severe stenosis. Valve area (VTI): 1.32 cm^2. Valve   area (Vmax): 1.41 cm^2. Valve area (Vmean): 1.28 cm^2.  Antimicrobials:  Vancomycin  Ceftriaxone  Metronidazole    Subjective: Chest pain overnight relieved by GI cocktail. No other concerns.  Objective: Vitals:   05/25/17 0528 05/25/17 1406 05/25/17 2149 05/26/17 0550  BP: (!) 120/54 (!) 135/49 (!) 144/63 (!) 176/74  Pulse: (!) 102 91 93 100  Resp: 20 16 17 17   Temp: 98.8 F (37.1 C) 97.8 F (36.6 C) 98.3 F (36.8 C) 98.6 F (37 C)  TempSrc: Oral Oral Oral Oral  SpO2: 100% 100% 97% 99%  Weight:      Height:        Intake/Output Summary (Last 24 hours) at 05/26/2017 0806 Last data filed at 05/25/2017 2300 Gross per 24 hour  Intake 2077.75 ml  Output 1150 ml  Net 927.75 ml   Filed Weights   05/24/17 1405  Weight: 68 kg (150 lb)    Examination:  General exam: Appears calm and comfortable Respiratory system: Clear to auscultation. Respiratory effort normal. Cardiovascular system: S1 & S2 heard, RRR. 3/6 diastolic murmurs heard best in RUSB, rubs, gallops or clicks. Gastrointestinal system: Abdomen is nondistended, soft and nontender. No organomegaly or masses felt. Normal bowel sounds heard. Central nervous system: Alert and oriented. No focal neurological deficits. Extremities: No edema. No calf tenderness Skin: No cyanosis. No rashes Psychiatry: Judgement and insight appear normal. Mood & affect appropriate.     Data Reviewed: I have personally reviewed following labs  and imaging studies  CBC: Recent Labs  Lab 05/24/17 1433 05/25/17 0034 05/25/17 0536  WBC 12.3*  --  6.8  NEUTROABS  --  5.4  --   HGB 10.1*  --  8.8*  HCT 32.2*  --  28.7*  MCV 81.9  --  82.7  PLT 298   --  341   Basic Metabolic Panel: Recent Labs  Lab 05/24/17 1433 05/25/17 0034 05/25/17 0536  NA 133*  --  134*  K 3.5  --  3.7  CL 103  --  105  CO2 22  --  24  GLUCOSE 97  --  91  BUN 7  --  7  CREATININE 0.63  --  0.60  CALCIUM 8.7*  --  7.8*  MG  --  1.6*  --   PHOS  --  3.1  --    GFR: Estimated Creatinine Clearance: 88.5 mL/min (by C-G formula based on SCr of 0.6 mg/dL). Liver Function Tests: Recent Labs  Lab 05/24/17 1433  AST 15  ALT 10*  ALKPHOS 64  BILITOT 0.5  PROT 7.4  ALBUMIN 2.8*   No results for input(s): LIPASE, AMYLASE in the last 168 hours. No results for input(s): AMMONIA in the last 168 hours. Coagulation Profile: No results for input(s): INR, PROTIME in the last 168 hours. Cardiac Enzymes: No results for input(s): CKTOTAL, CKMB, CKMBINDEX, TROPONINI in the last 168 hours. BNP (last 3 results) No results for input(s): PROBNP in the last 8760 hours. HbA1C: No results for input(s): HGBA1C in the last 72 hours. CBG: No results for input(s): GLUCAP in the last 168 hours. Lipid Profile: No results for input(s): CHOL, HDL, LDLCALC, TRIG, CHOLHDL, LDLDIRECT in the last 72 hours. Thyroid Function Tests: Recent Labs    05/25/17 0034  TSH 3.337   Anemia Panel: Recent Labs    05/25/17 0034  VITAMINB12 417  FERRITIN 56  TIBC 183*  IRON 21*  RETICCTPCT 3.3*   Sepsis Labs: Recent Labs  Lab 05/24/17 1452 05/24/17 1811  LATICACIDVEN 1.35 1.18    Recent Results (from the past 240 hour(s))  Blood culture (routine x 2)     Status: None   Collection Time: 05/17/17  8:55 PM  Result Value Ref Range Status   Specimen Description BLOOD LEFT ANTECUBITAL  Final   Special Requests   Final    BOTTLES DRAWN AEROBIC AND ANAEROBIC Blood Culture adequate volume   Culture NO GROWTH 5 DAYS  Final   Report Status 05/22/2017 FINAL  Final  Blood culture (routine x 2)     Status: None   Collection Time: 05/17/17  9:05 PM  Result Value Ref Range Status    Specimen Description BLOOD RIGHT ARM  Final   Special Requests   Final    BOTTLES DRAWN AEROBIC AND ANAEROBIC Blood Culture adequate volume   Culture NO GROWTH 5 DAYS  Final   Report Status 05/22/2017 FINAL  Final  Wet prep, genital     Status: Abnormal   Collection Time: 05/17/17 10:07 PM  Result Value Ref Range Status   Yeast Wet Prep HPF POC NONE SEEN NONE SEEN Final   Trich, Wet Prep NONE SEEN NONE SEEN Final   Clue Cells Wet Prep HPF POC PRESENT (A) NONE SEEN Final   WBC, Wet Prep HPF POC FEW (A) NONE SEEN Final   Sperm NONE SEEN  Final  Culture, blood (routine x 2)     Status: None (Preliminary result)   Collection Time:  05/24/17 11:05 PM  Result Value Ref Range Status   Specimen Description BLOOD RIGHT ANTECUBITAL  Final   Special Requests   Final    BOTTLES DRAWN AEROBIC AND ANAEROBIC Blood Culture adequate volume   Culture  Setup Time   Final    GRAM POSITIVE COCCI IN CHAINS IN BOTH AEROBIC AND ANAEROBIC BOTTLES CRITICAL VALUE NOTED.  VALUE IS CONSISTENT WITH PREVIOUSLY REPORTED AND CALLED VALUE.    Culture GRAM POSITIVE COCCI  Final   Report Status PENDING  Incomplete  Culture, blood (routine x 2)     Status: None (Preliminary result)   Collection Time: 05/24/17 11:14 PM  Result Value Ref Range Status   Specimen Description BLOOD LEFT WRIST  Final   Special Requests   Final    BOTTLES DRAWN AEROBIC AND ANAEROBIC Blood Culture adequate volume   Culture  Setup Time   Final    GRAM POSITIVE COCCI IN CHAINS IN BOTH AEROBIC AND ANAEROBIC BOTTLES CRITICAL RESULT CALLED TO, READ BACK BY AND VERIFIED WITH: JNARKLE,PHARMD @2115  05/25/17 BY LHOWARD    Culture GRAM POSITIVE COCCI  Final   Report Status PENDING  Incomplete  Blood Culture ID Panel (Reflexed)     Status: Abnormal   Collection Time: 05/24/17 11:14 PM  Result Value Ref Range Status   Enterococcus species NOT DETECTED NOT DETECTED Final   Listeria monocytogenes NOT DETECTED NOT DETECTED Final   Staphylococcus  species NOT DETECTED NOT DETECTED Final   Staphylococcus aureus NOT DETECTED NOT DETECTED Final   Streptococcus species DETECTED (A) NOT DETECTED Final    Comment: Not Enterococcus species, Streptococcus agalactiae, Streptococcus pyogenes, or Streptococcus pneumoniae. CRITICAL RESULT CALLED TO, READ BACK BY AND VERIFIED WITH: JNARKLE,PHARMD @2115  05/25/17 BY LHOWARD    Streptococcus agalactiae NOT DETECTED NOT DETECTED Final   Streptococcus pneumoniae NOT DETECTED NOT DETECTED Final   Streptococcus pyogenes NOT DETECTED NOT DETECTED Final   Acinetobacter baumannii NOT DETECTED NOT DETECTED Final   Enterobacteriaceae species NOT DETECTED NOT DETECTED Final   Enterobacter cloacae complex NOT DETECTED NOT DETECTED Final   Escherichia coli NOT DETECTED NOT DETECTED Final   Klebsiella oxytoca NOT DETECTED NOT DETECTED Final   Klebsiella pneumoniae NOT DETECTED NOT DETECTED Final   Proteus species NOT DETECTED NOT DETECTED Final   Serratia marcescens NOT DETECTED NOT DETECTED Final   Haemophilus influenzae NOT DETECTED NOT DETECTED Final   Neisseria meningitidis NOT DETECTED NOT DETECTED Final   Pseudomonas aeruginosa NOT DETECTED NOT DETECTED Final   Candida albicans NOT DETECTED NOT DETECTED Final   Candida glabrata NOT DETECTED NOT DETECTED Final   Candida krusei NOT DETECTED NOT DETECTED Final   Candida parapsilosis NOT DETECTED NOT DETECTED Final   Candida tropicalis NOT DETECTED NOT DETECTED Final         Radiology Studies: Dg Chest 2 View  Result Date: 05/24/2017 CLINICAL DATA:  Chest pain EXAM: CHEST  2 VIEW COMPARISON:  May 08, 2017 FINDINGS: Heart is mildly enlarged with pulmonary vascularity within normal limits. No edema or consolidation. No adenopathy. No evident bone lesions. IMPRESSION: Mild cardiac enlargement.  No edema or consolidation. Electronically Signed   By: Lowella Grip III M.D.   On: 05/24/2017 14:38   Ct Angio Chest Pe W And/or Wo  Contrast  Result Date: 05/24/2017 CLINICAL DATA:  Right-sided chest pain radiating to the back for 2 days. Dyspnea. EXAM: CT ANGIOGRAPHY CHEST WITH CONTRAST TECHNIQUE: Multidetector CT imaging of the chest was performed using the standard protocol during bolus administration  of intravenous contrast. Multiplanar CT image reconstructions and MIPs were obtained to evaluate the vascular anatomy. CONTRAST:  136mL ISOVUE-370 IOPAMIDOL (ISOVUE-370) INJECTION 76% COMPARISON:  Radiographs 05/24/2017 at 14:31 FINDINGS: Cardiovascular: Satisfactory opacification of the pulmonary arteries to the segmental level. No evidence of pulmonary embolism. Normal heart size. No pericardial effusion. The thoracic aorta is normal in caliber and intact. Mediastinum/Nodes: No enlarged mediastinal, hilar, or axillary lymph nodes. Thyroid gland, trachea, and esophagus demonstrate no significant findings. Lungs/Pleura: Lungs are clear. No pleural effusion or pneumothorax. Upper Abdomen: No acute abnormality. Musculoskeletal: No chest wall abnormality. No acute or significant osseous findings. Review of the MIP images confirms the above findings. IMPRESSION: Negative for pulmonary embolism.  No significant abnormality. Electronically Signed   By: Andreas Newport M.D.   On: 05/24/2017 20:32   US Renal  Result Date: 05/25/2017 CLINICAL DATA:  Pyelonephritis. EXAM: RENAL / URINARY TRACT ULTRASOUND COMPLETE COMPARISON:  Ultrasound 05/18/2017.  CT 05/08/2017. FINDINGS: Right Kidney: Length: 13.0 cm. Echogenicity within normal limits. No mass or hydronephrosis visualized. Left Kidney: Length: 13.4 cm. Echogenicity within normal limits. No mass or hydronephrosis visualized. Bladder: Appears normal for degree of bladder distention. 4.6 cm cyst left lower pelvis, most likely left ovarian. This is increased slightly in size from prior exam. Two internal echoes are noted on today's exam. This may represent a hemorrhagic cyst. IMPRESSION: 1. No  acute renal abnormality. No hydronephrosis or bladder distention. 2. 4.6 cm cyst left lower pelvis. This is most likely ovarian in origin. This has increased slightly in size from prior exam. Faint internal echoes are noted within the cyst on today's exam. This this cyst may be a hemorrhagic cyst. Pregnancy test to exclude ectopic pregnancy suggested. Short-interval follow up ultrasound in 6-12 weeks is recommended, preferably during the week following the patient's normal menses. Electronically Signed   By: Marcello Moores  Register   On: 05/25/2017 12:25        Scheduled Meds: . enoxaparin (LOVENOX) injection  40 mg Subcutaneous Q24H  . metroNIDAZOLE  500 mg Oral Q12H  . pantoprazole  40 mg Oral Daily   Continuous Infusions: . cefTRIAXone (ROCEPHIN)  IV Stopped (05/25/17 2327)     LOS: 0 days     Cordelia Poche, MD [facility] 05/26/2017, 8:06 AM Pager: [phone]  If 7PM-7AM, please contact night-coverage [email or web address] Password [email or web address] 05/26/2017, 8:06 AM

## 2017-05-26 NOTE — Progress Notes (Signed)
Pt c/o indigestion after giving the flagyl PO,  Dr. Megan Salon on the dept and informed.

## 2017-05-26 NOTE — Progress Notes (Signed)
Patient ID: Krystal Shaffer, female   DOB: 14-Jul-1975, 41 y.o.   MRN: [id number]          [facility]  Date of Admission:  05/24/2017   Day 9 metronidazole        Day 2 ceftriaxone ASSESSMENT: She has subacute streptococcal aortic valve endocarditis.  She has had some partial improvement on 2 recent rounds of cephalosporin therapy so I will continue ceftriaxone pending final speciation and antibiotic testing.  Repeat blood cultures are pending.  I will obtain an EKG to help with evaluation of her chest pain and to look for any evidence of conduction abnormalities.  I would consider a TEE and cardiac surgery evaluation next week.  Her chest pain it may be related to her oral metronidazole therapy she has more than enough treatment for bacterial vaginosis.  I will stop the metronidazole now.  PLAN: 1. Continue ceftriaxone 2. Discontinue metronidazole 3. 12-lead EKG 4. Await results of repeat blood cultures  Principal Problem:   Sepsis ([place]) Active Problems:   Bacteremia due to Streptococcus   Aortic valve endocarditis   Diastolic murmur   Bacterial vaginosis   Atypical chest pain   Temporal headache   Streptococcal sepsis ([facility])   Scheduled Meds: . enoxaparin (LOVENOX) injection  40 mg Subcutaneous Q24H  . metroNIDAZOLE  500 mg Oral Q12H  . pantoprazole  40 mg Oral Daily   Continuous Infusions: . cefTRIAXone (ROCEPHIN)  IV Stopped (05/25/17 2327)   PRN Meds:.acetaminophen **OR** acetaminophen, gi cocktail, ketorolac, morphine injection, ondansetron **OR** ondansetron (ZOFRAN) IV, polyethylene glycol   SUBJECTIVE: [name] has not felt well since May of this year.  She has been depressed about conflict with her daughter.  In August she began to have fever, chills and sweats.  They became more frequent 1 month ago.  She was seen in the emergency department on 05/08/2017 and treated for presumed UTI with cephalexin.  She felt better for a few days  then worse again.  She was admitted for fevers on 05/18/2017.  She was treated with IV ceftriaxone, again for a presumed UTI.  She was discharged on ciprofloxacin (and metronidazole for bacterial vaginosis).  She felt a little bit better upon discharge the following day but has had recurrent fevers, chills and sweats since that time leading to readmission 2 days ago.  She has a new heart murmur.  Admission blood cultures are now growing streptococci in both sets.  Transthoracic echocardiogram shows a 1 cm vegetation on the aortic valve with severe aortic stenosis.  She has had 3 recent episodes of severe retrosternal chest pain.  She tells me that GI cocktails help with the pain.  Review of Systems: Review of Systems  Constitutional: Positive for chills, diaphoresis, fever, malaise/fatigue and weight loss.       She has lost about 45 pounds of weight unintentionally since May.  HENT: Negative for congestion and sore throat.   Respiratory: Negative for cough, sputum production and shortness of breath.   Cardiovascular: Positive for chest pain.  Gastrointestinal: Negative for abdominal pain, diarrhea, heartburn, nausea and vomiting.  Genitourinary: Negative for dysuria and frequency.  Musculoskeletal: Negative for joint pain and myalgias.  Skin: Negative for rash.  Neurological: Negative for dizziness and headaches.  Psychiatric/Behavioral: Positive for depression. Negative for substance abuse. The patient is not nervous/anxious.     No Known Allergies  OBJECTIVE: Vitals:   05/25/17 0528 05/25/17 1406 05/25/17 2149 05/26/17 0550  BP: (!) 120/54 Marland Kitchen)  135/49 (!) 144/63 (!) 176/74  Pulse: (!) 102 91 93 100  Resp: 20 16 17 17   Temp: 98.8 F (37.1 C) 97.8 F (36.6 C) 98.3 F (36.8 C) 98.6 F (37 C)  TempSrc: Oral Oral Oral Oral  SpO2: 100% 100% 97% 99%  Weight:      Height:       Body mass index is 25.75 kg/m.  Physical Exam  Constitutional: She is oriented to person, place, and time.    She is a very pleasant young woman.  She is sitting up in bed rocking back and forth because of chest pain.  She became tearful when talking about her daughter.  HENT:  Mouth/Throat: No oropharyngeal exudate.  Eyes: Conjunctivae are normal.  Cardiovascular: Normal rate and regular rhythm.  Murmur heard. 2/6 to and fro, systolic-diastolic murmur.  Pulmonary/Chest: Effort normal and breath sounds normal. She has no wheezes. She has no rales.  Abdominal: Soft. She exhibits no distension. There is no tenderness.  Musculoskeletal: Normal range of motion. She exhibits no edema or tenderness.  Neurological: She is alert and oriented to person, place, and time.  Skin: No rash noted.  Psychiatric: Mood and affect normal.    Lab Results Lab Results  Component Value Date   WBC 6.8 05/25/2017   HGB 8.8 (L) 05/25/2017   HCT 28.7 (L) 05/25/2017   MCV 82.7 05/25/2017   PLT 254 05/25/2017    Lab Results  Component Value Date   CREATININE 0.60 05/25/2017   BUN 7 05/25/2017   NA 134 (L) 05/25/2017   K 3.7 05/25/2017   CL 105 05/25/2017   CO2 24 05/25/2017    Lab Results  Component Value Date   ALT 10 (L) 05/24/2017   AST 15 05/24/2017   ALKPHOS 64 05/24/2017   BILITOT 0.5 05/24/2017     Microbiology: Recent Results (from the past 240 hour(s))  Blood culture (routine x 2)     Status: None   Collection Time: 05/17/17  8:55 PM  Result Value Ref Range Status   Specimen Description BLOOD LEFT ANTECUBITAL  Final   Special Requests   Final    BOTTLES DRAWN AEROBIC AND ANAEROBIC Blood Culture adequate volume   Culture NO GROWTH 5 DAYS  Final   Report Status 05/22/2017 FINAL  Final  Blood culture (routine x 2)     Status: None   Collection Time: 05/17/17  9:05 PM  Result Value Ref Range Status   Specimen Description BLOOD RIGHT ARM  Final   Special Requests   Final    BOTTLES DRAWN AEROBIC AND ANAEROBIC Blood Culture adequate volume   Culture NO GROWTH 5 DAYS  Final   Report Status  05/22/2017 FINAL  Final  Wet prep, genital     Status: Abnormal   Collection Time: 05/17/17 10:07 PM  Result Value Ref Range Status   Yeast Wet Prep HPF POC NONE SEEN NONE SEEN Final   Trich, Wet Prep NONE SEEN NONE SEEN Final   Clue Cells Wet Prep HPF POC PRESENT (A) NONE SEEN Final   WBC, Wet Prep HPF POC FEW (A) NONE SEEN Final   Sperm NONE SEEN  Final  Urine culture     Status: None   Collection Time: 05/24/17  6:06 PM  Result Value Ref Range Status   Specimen Description URINE, RANDOM  Final   Special Requests NONE  Final   Culture NO GROWTH  Final   Report Status 05/26/2017 FINAL  Final  Culture,  blood (routine x 2)     Status: None (Preliminary result)   Collection Time: 05/24/17 11:05 PM  Result Value Ref Range Status   Specimen Description BLOOD RIGHT ANTECUBITAL  Final   Special Requests   Final    BOTTLES DRAWN AEROBIC AND ANAEROBIC Blood Culture adequate volume   Culture  Setup Time   Final    GRAM POSITIVE COCCI IN CHAINS IN BOTH AEROBIC AND ANAEROBIC BOTTLES CRITICAL VALUE NOTED.  VALUE IS CONSISTENT WITH PREVIOUSLY REPORTED AND CALLED VALUE.    Culture GRAM POSITIVE COCCI  Final   Report Status PENDING  Incomplete  Culture, blood (routine x 2)     Status: None (Preliminary result)   Collection Time: 05/24/17 11:14 PM  Result Value Ref Range Status   Specimen Description BLOOD LEFT WRIST  Final   Special Requests   Final    BOTTLES DRAWN AEROBIC AND ANAEROBIC Blood Culture adequate volume   Culture  Setup Time   Final    GRAM POSITIVE COCCI IN CHAINS IN BOTH AEROBIC AND ANAEROBIC BOTTLES CRITICAL RESULT CALLED TO, READ BACK BY AND VERIFIED WITH: JNARKLE,PHARMD @2115  05/25/17 BY LHOWARD    Culture GRAM POSITIVE COCCI  Final   Report Status PENDING  Incomplete  Blood Culture ID Panel (Reflexed)     Status: Abnormal   Collection Time: 05/24/17 11:14 PM  Result Value Ref Range Status   Enterococcus species NOT DETECTED NOT DETECTED Final   Listeria  monocytogenes NOT DETECTED NOT DETECTED Final   Staphylococcus species NOT DETECTED NOT DETECTED Final   Staphylococcus aureus NOT DETECTED NOT DETECTED Final   Streptococcus species DETECTED (A) NOT DETECTED Final    Comment: Not Enterococcus species, Streptococcus agalactiae, Streptococcus pyogenes, or Streptococcus pneumoniae. CRITICAL RESULT CALLED TO, READ BACK BY AND VERIFIED WITH: JNARKLE,PHARMD @2115  05/25/17 BY LHOWARD    Streptococcus agalactiae NOT DETECTED NOT DETECTED Final   Streptococcus pneumoniae NOT DETECTED NOT DETECTED Final   Streptococcus pyogenes NOT DETECTED NOT DETECTED Final   Acinetobacter baumannii NOT DETECTED NOT DETECTED Final   Enterobacteriaceae species NOT DETECTED NOT DETECTED Final   Enterobacter cloacae complex NOT DETECTED NOT DETECTED Final   Escherichia coli NOT DETECTED NOT DETECTED Final   Klebsiella oxytoca NOT DETECTED NOT DETECTED Final   Klebsiella pneumoniae NOT DETECTED NOT DETECTED Final   Proteus species NOT DETECTED NOT DETECTED Final   Serratia marcescens NOT DETECTED NOT DETECTED Final   Haemophilus influenzae NOT DETECTED NOT DETECTED Final   Neisseria meningitidis NOT DETECTED NOT DETECTED Final   Pseudomonas aeruginosa NOT DETECTED NOT DETECTED Final   Candida albicans NOT DETECTED NOT DETECTED Final   Candida glabrata NOT DETECTED NOT DETECTED Final   Candida krusei NOT DETECTED NOT DETECTED Final   Candida parapsilosis NOT DETECTED NOT DETECTED Final   Candida tropicalis NOT DETECTED NOT DETECTED Final  Culture, blood (Routine X 2) w Reflex to ID Panel     Status: None (Preliminary result)   Collection Time: 05/25/17  3:25 PM  Result Value Ref Range Status   Specimen Description BLOOD BLOOD RIGHT HAND  Final   Special Requests IN PEDIATRIC BOTTLE Blood Culture adequate volume  Final   Culture NO GROWTH < 24 HOURS  Final   Report Status PENDING  Incomplete    Michel Bickers, MD [facility] [place] [phone] pager   [phone] cell 05/26/2017, 3:27 PM

## 2017-05-27 ENCOUNTER — Inpatient Hospital Stay (HOSPITAL_COMMUNITY): Payer: 59

## 2017-05-27 DIAGNOSIS — I358 Other nonrheumatic aortic valve disorders: Secondary | ICD-10-CM

## 2017-05-27 DIAGNOSIS — A408 Other streptococcal sepsis: Principal | ICD-10-CM

## 2017-05-27 DIAGNOSIS — I351 Nonrheumatic aortic (valve) insufficiency: Secondary | ICD-10-CM

## 2017-05-27 DIAGNOSIS — I339 Acute and subacute endocarditis, unspecified: Secondary | ICD-10-CM

## 2017-05-27 LAB — CULTURE, BLOOD (ROUTINE X 2)
SPECIAL REQUESTS: ADEQUATE
Special Requests: ADEQUATE

## 2017-05-27 LAB — RPR: RPR Ser Ql: REACTIVE — AB

## 2017-05-27 LAB — HIV-1 RNA, PCR (GRAPH) RFX/GENO EDI: HIV-1 RNA QUANT, LOG: UNDETERMINED {Log_copies}/mL

## 2017-05-27 LAB — RPR, QUANT+TP ABS (REFLEX): T Pallidum Abs: NEGATIVE

## 2017-05-27 MED ORDER — TRAZODONE HCL 50 MG PO TABS
50.0000 mg | ORAL_TABLET | Freq: Once | ORAL | Status: AC
  Administered 2017-05-27: 50 mg via ORAL
  Filled 2017-05-27: qty 1

## 2017-05-27 MED ORDER — PANTOPRAZOLE SODIUM 40 MG PO TBEC
40.0000 mg | DELAYED_RELEASE_TABLET | Freq: Two times a day (BID) | ORAL | Status: DC
  Administered 2017-05-27 – 2017-06-03 (×12): 40 mg via ORAL
  Filled 2017-05-27 (×12): qty 1

## 2017-05-27 MED ORDER — SUCRALFATE 1 GM/10ML PO SUSP
1.0000 g | Freq: Three times a day (TID) | ORAL | Status: DC
  Administered 2017-05-27 – 2017-06-03 (×24): 1 g via ORAL
  Filled 2017-05-27 (×28): qty 10

## 2017-05-27 MED ORDER — FLUCONAZOLE 100 MG PO TABS
100.0000 mg | ORAL_TABLET | Freq: Every day | ORAL | Status: DC
  Administered 2017-05-27 – 2017-05-30 (×4): 100 mg via ORAL
  Filled 2017-05-27 (×4): qty 1

## 2017-05-27 MED ORDER — NITROGLYCERIN 0.4 MG SL SUBL
0.4000 mg | SUBLINGUAL_TABLET | SUBLINGUAL | Status: DC | PRN
  Administered 2017-05-27 – 2017-06-04 (×14): 0.4 mg via SUBLINGUAL
  Filled 2017-05-27 (×13): qty 1

## 2017-05-27 NOTE — Progress Notes (Signed)
Pt states that her pain was a "10" before the NTG and then a "7/8" after the SL NTG.

## 2017-05-27 NOTE — Progress Notes (Signed)
Patient ID: Krystal Shaffer, female   DOB: [birth year], 41 y.o.   MRN: [id number]          [facility] for Infectious Disease    Date of Admission:  05/24/2017   Day 3 ceftriaxone         Admission blood cultures have grown viridans streptococci sensitive to ceftriaxone. Repeat blood cultures are negative so far. I would hold off on PICC placement until we know they were going to remain negative.         Michel Bickers, MD [facility] [phone] pager   [phone] cell 05/27/2017, 1:57 PM

## 2017-05-27 NOTE — Consult Note (Signed)
Consultation  Referring Provider: [facility]/Nettey MD Primary Care Physician:  Sofie Rower, PA-C Primary Gastroenterologist: none -unassigned  Reason for Consultation:  Atypical chest pain  HPI: Krystal Shaffer is a 41 y.o. female , generally in good health with history of migraines, who was recently admitted 12/ 6 through 12/08/2018after she failed oral antibiotics outpatient for an Escherichia coli UTI. She presented with nausea vomiting and fever and possible left pyelonephritis despite being on oral Keflex. She was treated with IV Rocephin during hospitalization and discharged on oral Cipro and also Flagyl for bacterial vaginosis. Patient was readmitted on 05/25/2017 with fever to 102 at home chills, sweats and myalgia. She has also related that she's been having some chest pain which is been coming and going. She had an episode about 4 days ago at work which she says is severe when it's presentation may last for 15 minutes and makes her feel short of breath.The pain is present midline in her chest. She says she has been called up in a fetal position with this pain. She had an episode following morning which woke her from sleep at about 2 AM. She said she felt short of breath with this week lightheaded and hot all over. Her son called EMS, apparently EKG was normal, EMS stayed with her for an hour or so symptoms subsided and she went on to work again that was admitted later that day. Since admission she has had continued intermittent episodes, she describes it as a squeezing pressure type pain always associated with a sense of air hunger. She received nitroglycerin this morning for an episode which did help, she also feels that GI cocktail was helpful. She has noticed some chest  discomfort with deep inspiration as well. On admission she was tachycardic, temp 102 WBC of 12.3. CTAngio was negative for pulmonary embolism Echocardiogram shows EF of 60% and aortic valve vegetation  consistent with endocarditis.  She has been seen byID and is felt to have a subacute streptococcal aortic valve endocarditis. She's currently on ceftriaxone, metronidazole is been discontinued. Admission blood cultures have grown viridans strep, repeat blood cultures negative so far.  Patient is to have cardiac evaluation with TEE tomorrow, and consideration of cardiac surgery evaluation.  Patient has no history of chronic GERD says she gets heartburn occasionally. She does not associate her current symptoms with eating and has been eating solid food without any difficulty. She denies any dysphagia or odynophagia. She denies any abdominal pain    Past Medical History:  Diagnosis Date  . Cystitis 05/2017  . Family history of adverse reaction to anesthesia    " mother has a hard time waking "  . Migraine   . Migraines     Past Surgical History:  Procedure Laterality Date  . WISDOM TOOTH EXTRACTION      Prior to Admission medications   Medication Sig Start Date End Date Taking? Authorizing Provider  aspirin-acetaminophen-caffeine (EXCEDRIN MIGRAINE) [phone] MG tablet Take 1-2 tablets by mouth every 6 (six) hours as needed for headache.   Yes [provider]  ondansetron (ZOFRAN ODT) 4 MG disintegrating tablet Take 1 tablet (4 mg total) by mouth every 8 (eight) hours as needed for nausea or vomiting. 05/08/17  Yes Duffy Bruce, MD    Current Facility-Administered Medications  Medication Dose Route Frequency Provider Last Rate Last Dose  . acetaminophen (TYLENOL) tablet 650 mg  650 mg Oral Q6H PRN Bennie Pierini, MD       Or  .  acetaminophen (TYLENOL) suppository 650 mg  650 mg Rectal Q6H PRN Bennie Pierini, MD      . cefTRIAXone (ROCEPHIN) 2 g in dextrose 5 % 50 mL IVPB  2 g Intravenous Q24H Michel Bickers, MD   Stopped at 05/26/17 2312  . enoxaparin (LOVENOX) injection 40 mg  40 mg Subcutaneous Q24H Bennie Pierini, MD   40 mg at 05/27/17 1111  . gi  cocktail (Maalox,Lidocaine,Donnatal)  30 mL Oral TID PRN Mariel Aloe, MD   30 mL at 05/26/17 2200  . ketorolac (TORADOL) 30 MG/ML injection 30 mg  30 mg Intravenous Q6H PRN Mariel Aloe, MD   30 mg at 05/27/17 1149  . morphine 4 MG/ML injection 2 mg  2 mg Intravenous Q4H PRN Mariel Aloe, MD   2 mg at 05/27/17 1137  . nitroGLYCERIN (NITROSTAT) SL tablet 0.4 mg  0.4 mg Sublingual Q5 min PRN Mariel Aloe, MD   0.4 mg at 05/27/17 1110  . ondansetron (ZOFRAN) tablet 4 mg  4 mg Oral Q6H PRN Bennie Pierini, MD       Or  . ondansetron [facility]) injection 4 mg  4 mg Intravenous Q6H PRN Bennie Pierini, MD   4 mg at 05/25/17 1555  . pantoprazole (PROTONIX) EC tablet 40 mg  40 mg Oral BID AC Meera Vasco S, PA-C      . polyethylene glycol (MIRALAX / GLYCOLAX) packet 17 g  17 g Oral Daily PRN Bennie Pierini, MD      . sucralfate (CARAFATE) 1 GM/10ML suspension 1 g  1 g Oral TID PC & HS Athalee Esterline S, PA-C        Allergies as of 05/24/2017  . (No Known Allergies)    Family History  Problem Relation Age of Onset  . Obesity Mother   . Hypertension Sister     Social History   Socioeconomic History  . Marital status: Legally Separated    Spouse name: Not on file  . Number of children: Not on file  . Years of education: Not on file  . Highest education level: Not on file  Social Needs  . Financial resource strain: Not on file  . Food insecurity - worry: Not on file  . Food insecurity - inability: Not on file  . Transportation needs - medical: Not on file  . Transportation needs - non-medical: Not on file  Occupational History  . Not on file  Tobacco Use  . Smoking status: Never Smoker  . Smokeless tobacco: Never Used  Substance and Sexual Activity  . Alcohol use: No  . Drug use: No  . Sexual activity: Yes    Birth control/protection: None  Other Topics Concern  . Not on file  Social History Narrative  . Not on file    Review of Systems: Pertinent  positive and negative review of systems were noted in the above HPI section.  All other review of systems was otherwise negative.Marland Kitchen  Physical Exam: Vital signs in last 24 hours: Temp:  [98.2 F (36.8 C)-98.7 F (37.1 C)] 98.3 F (36.8 C) (12/16 1417) Pulse Rate:  [92-104] 96 (12/16 1417) Resp:  [18] 18 (12/16 1417) BP: (139-186)/(61-76) 139/67 (12/16 1417) SpO2:  [96 %-100 %] 96 % (12/16 1417) Last BM Date: 05/24/17 General:   Alert,  Well-developed, well-nourished, pleasant  AA female , cooperative in NAD Head:  Normocephalic and atraumatic. Eyes:  Sclera clear, no icterus.   Conjunctiva pink. Ears:  Normal  auditory acuity. Nose:  No deformity, discharge,  or lesions. Mouth:  No deformity or lesions.   Neck:  Supple; no masses or thyromegaly. Lungs:  Clear throughout to auscultation.   No wheezes, crackles, or rhonchi. Heart:  Regular rate and rhythm; soft systolic murmur   Abdomen:  Soft,nontender, BS active,nonpalp mass or hsm.   Rectal:  Deferred  Msk:  Symmetrical without gross deformities. . Pulses:  Normal pulses noted. Extremities:  Without clubbing or edema. Neurologic:  Alert and  oriented x4;  grossly normal neurologically. Skin:  Intact without significant lesions or rashes.. Psych:  Alert and cooperative. Normal mood and affect.  Intake/Output from previous day: 12/15 0701 - 12/16 0700 In: 700 [P.O.:600; IV Piggyback:100] Out: -  Intake/Output this shift: Total I/O In: 120 [P.O.:120] Out: -   Lab Results: Recent Labs    05/25/17 0536  WBC 6.8  HGB 8.8*  HCT 28.7*  PLT 254   BMET Recent Labs    05/25/17 0536  NA 134*  K 3.7  CL 105  CO2 24  GLUCOSE 91  BUN 7  CREATININE 0.60  CALCIUM 7.8*   LFT No results for input(s): PROT, ALBUMIN, AST, ALT, ALKPHOS, BILITOT, BILIDIR, IBILI in the last 72 hours. PT/INR No results for input(s): LABPROT, INR in the last 72 hours. Hepatitis Panel Recent Labs    05/25/17 1525  HEPBSAG Negative  HCVAB <0.1   HEPAIGM Negative  HEPBIGM Negative     IMPRESSION:  #10 41 year old African-American female with bacteremia secondary to strep viridans, secondary to recent refractory urinary tract infection and possible pyelonephritis. Patient now with a subacute streptococcal aortic valve endocarditis,readmittedwith complaints of fever or chills and chest pain  Her chest pain is somewhat atypical, it is not consistent with esophagitis though certainly she may have esophagitis and/or Candida esophagitis after recent courses of antibiotics area and she has no complaints of dysphagia or odynophagia. I would be more concerned that her chest pain is of cardiac etiolog rule out component of myocarditis  #2 migraine headaches #3 chronic normocytic anemia  Plan; As she is not having any dysphagia or odynophagia will allow regular diet Protonix 40 mg by mouth twice a day Add Carafate slurry 1 g between meals and bedtime Keep head of bed elevated 45 Okay to continue GI cocktail when necessary Once all of her cardiac evaluation is completed if etiology of chest pain and shortness of breath is still in question we can consider upper endoscopy.  Will also start her on a course of Diflucan empirically which would treat candida esophagitis.   [place] Whedbee  05/27/2017, 2:55 PM

## 2017-05-27 NOTE — Progress Notes (Signed)
In talking with pt, she states that she had some dental work done last year in Dec and then in Oct had a tooth that broke off (left lower).  Her mother was very sick so she did not follow up with the dentist because she had to take care of her mother.  She became very exhausted and starting running high fevers at that time.

## 2017-05-27 NOTE — Progress Notes (Signed)
PROGRESS NOTE    Krystal Shaffer  DGL:[id number] DOB: 1976/04/24 DOA: 05/24/2017 PCP: Sofie Rower, PA-C   Brief Narrative: Krystal Shaffer is a 41 y.o. female with medical history significant for recent E. coli UTI with failed outpatient management requiring short inpatient stay for antibiotic therapy who presents to the ED with continued fever, chills, night sweats, myalgias. She presented with sepsis concerning for endocarditis with new murmr   Assessment & Plan:   Principal Problem:   Sepsis ([place]) Active Problems:   Bacteremia due to Streptococcus   Aortic valve endocarditis   Diastolic murmur   Bacterial vaginosis   Atypical chest pain   Temporal headache   Streptococcal sepsis ([facility])   Sepsis Streptococcal viridans bacteremia BCID positive for strep. 2/2 cultures (12/13) positive. Ceftriaxone restarted overnight. Patient with recent dental work and broken teeth. Urine culture with no growth -Continue ceftriaxone  -Blood culture (12/13 and 12/15) -Orthopantogram   Aortic valve vegetation New. Concern for endocarditis. -TEE -Consult cardiovascular surgery  History of UTI Flank pain Patient with under-treated UTI secondary to non-adherence secondary to nausea and vomiting.  -Urine culture pending  Bacterial vaginosis -Continue metronidazole  Normocytic anemia Iron studies in line with anemia of chronic disease  Headache Chronic. -Continue morphine prn  Atypical chest pain Reflux vs dysmotility vs esophagitis. -Protonix -GI cocktail prn -Nitro prn -GI consult   DVT prophylaxis: Lovenox Code Status: Full code Family Communication: None at bedside Disposition Plan: Discharge pending workup   Consultants:   None  Procedures:   Echocardiogram (12/14) Study Conclusions  - Left ventricle: The cavity size was normal. Systolic function was   normal. The estimated ejection fraction was in the range of 60%   to 65%. Wall motion was normal;  there were no regional wall   motion abnormalities. Doppler parameters are consistent with   abnormal left ventricular relaxation (grade 1 diastolic   dysfunction). - Aortic valve: Trileaflet; moderately thickened leaflets with 1cm   x 1cm mass on the right coronary cusp, mild mobility, suggestive   of vegetation. If no signs of infection, consider fibroelastoma.   There was severe stenosis. Valve area (VTI): 1.32 cm^2. Valve   area (Vmax): 1.41 cm^2. Valve area (Vmean): 1.28 cm^2.  Antimicrobials:  Vancomycin  Ceftriaxone  Metronidazole    Subjective: Chest pain is intermittent throughout the day now. Improved with GI cocktail. No nausea or vomiting.  Objective: Vitals:   05/26/17 1458 05/26/17 1551 05/26/17 2017 05/27/17 0447  BP: (!) 181/76 (!) 186/72 (!) 155/73 (!) 160/61  Pulse: 97 (!) 102 92 (!) 104  Resp: 18  18 18   Temp: 98.7 F (37.1 C)  98.2 F (36.8 C) 98.3 F (36.8 C)  TempSrc: Oral  Oral Oral  SpO2: 96% 100% 100% 98%  Weight:      Height:        Intake/Output Summary (Last 24 hours) at 05/27/2017 1208 Last data filed at 05/27/2017 0448 Gross per 24 hour  Intake 700 ml  Output -  Net 700 ml   Filed Weights   05/24/17 1405  Weight: 68 kg (150 lb)    Examination:  General exam: Appears calm and comfortable Respiratory system: Respiratory effort normal Cardiovascular system: S1 & S2 heard, RRR. 3/6 diastolic murmurs heard best in RUSB, rubs, gallops or clicks. Gastrointestinal system: Abdomen is nondistended, soft and nontender. No organomegaly or masses felt. Normal bowel sounds heard. Central nervous system: Alert and oriented. No focal neurological deficits. Extremities: No edema. No calf tenderness Skin: No  cyanosis. No rashes Psychiatry: Judgement and insight appear normal. Mood & affect appropriate.     Data Reviewed: I have personally reviewed following labs and imaging studies  CBC: Recent Labs  Lab 05/24/17 1433 05/25/17 0034  05/25/17 0536  WBC 12.3*  --  6.8  NEUTROABS  --  5.4  --   HGB 10.1*  --  8.8*  HCT 32.2*  --  28.7*  MCV 81.9  --  82.7  PLT 298  --  245   Basic Metabolic Panel: Recent Labs  Lab 05/24/17 1433 05/25/17 0034 05/25/17 0536  NA 133*  --  134*  K 3.5  --  3.7  CL 103  --  105  CO2 22  --  24  GLUCOSE 97  --  91  BUN 7  --  7  CREATININE 0.63  --  0.60  CALCIUM 8.7*  --  7.8*  MG  --  1.6*  --   PHOS  --  3.1  --    GFR: Estimated Creatinine Clearance: 88.5 mL/min (by C-G formula based on SCr of 0.6 mg/dL). Liver Function Tests: Recent Labs  Lab 05/24/17 1433  AST 15  ALT 10*  ALKPHOS 64  BILITOT 0.5  PROT 7.4  ALBUMIN 2.8*   No results for input(s): LIPASE, AMYLASE in the last 168 hours. No results for input(s): AMMONIA in the last 168 hours. Coagulation Profile: No results for input(s): INR, PROTIME in the last 168 hours. Cardiac Enzymes: No results for input(s): CKTOTAL, CKMB, CKMBINDEX, TROPONINI in the last 168 hours. BNP (last 3 results) No results for input(s): PROBNP in the last 8760 hours. HbA1C: No results for input(s): HGBA1C in the last 72 hours. CBG: No results for input(s): GLUCAP in the last 168 hours. Lipid Profile: No results for input(s): CHOL, HDL, LDLCALC, TRIG, CHOLHDL, LDLDIRECT in the last 72 hours. Thyroid Function Tests: Recent Labs    05/25/17 0034  TSH 3.337   Anemia Panel: Recent Labs    05/25/17 0034  VITAMINB12 417  FERRITIN 56  TIBC 183*  IRON 21*  RETICCTPCT 3.3*   Sepsis Labs: Recent Labs  Lab 05/24/17 1452 05/24/17 1811  LATICACIDVEN 1.35 1.18    Recent Results (from the past 240 hour(s))  Blood culture (routine x 2)     Status: None   Collection Time: 05/17/17  8:55 PM  Result Value Ref Range Status   Specimen Description BLOOD LEFT ANTECUBITAL  Final   Special Requests   Final    BOTTLES DRAWN AEROBIC AND ANAEROBIC Blood Culture adequate volume   Culture NO GROWTH 5 DAYS  Final   Report Status  05/22/2017 FINAL  Final  Blood culture (routine x 2)     Status: None   Collection Time: 05/17/17  9:05 PM  Result Value Ref Range Status   Specimen Description BLOOD RIGHT ARM  Final   Special Requests   Final    BOTTLES DRAWN AEROBIC AND ANAEROBIC Blood Culture adequate volume   Culture NO GROWTH 5 DAYS  Final   Report Status 05/22/2017 FINAL  Final  Wet prep, genital     Status: Abnormal   Collection Time: 05/17/17 10:07 PM  Result Value Ref Range Status   Yeast Wet Prep HPF POC NONE SEEN NONE SEEN Final   Trich, Wet Prep NONE SEEN NONE SEEN Final   Clue Cells Wet Prep HPF POC PRESENT (A) NONE SEEN Final   WBC, Wet Prep HPF POC FEW (A) NONE SEEN Final  Sperm NONE SEEN  Final  Urine culture     Status: None   Collection Time: 05/24/17  6:06 PM  Result Value Ref Range Status   Specimen Description URINE, RANDOM  Final   Special Requests NONE  Final   Culture NO GROWTH  Final   Report Status 05/26/2017 FINAL  Final  Culture, blood (routine x 2)     Status: Abnormal   Collection Time: 05/24/17 11:05 PM  Result Value Ref Range Status   Specimen Description BLOOD RIGHT ANTECUBITAL  Final   Special Requests   Final    BOTTLES DRAWN AEROBIC AND ANAEROBIC Blood Culture adequate volume   Culture  Setup Time   Final    GRAM POSITIVE COCCI IN CHAINS IN BOTH AEROBIC AND ANAEROBIC BOTTLES CRITICAL VALUE NOTED.  VALUE IS CONSISTENT WITH PREVIOUSLY REPORTED AND CALLED VALUE.    Culture (A)  Final    VIRIDANS STREPTOCOCCUS SUSCEPTIBILITIES PERFORMED ON PREVIOUS CULTURE WITHIN THE LAST 5 DAYS.    Report Status 05/27/2017 FINAL  Final  Culture, blood (routine x 2)     Status: Abnormal   Collection Time: 05/24/17 11:14 PM  Result Value Ref Range Status   Specimen Description BLOOD LEFT WRIST  Final   Special Requests   Final    BOTTLES DRAWN AEROBIC AND ANAEROBIC Blood Culture adequate volume   Culture  Setup Time   Final    GRAM POSITIVE COCCI IN CHAINS IN BOTH AEROBIC AND ANAEROBIC  BOTTLES CRITICAL RESULT CALLED TO, READ BACK BY AND VERIFIED WITH: JNARKLE,PHARMD @2115  05/25/17 BY LHOWARD    Culture VIRIDANS STREPTOCOCCUS (A)  Final   Report Status 05/27/2017 FINAL  Final   Organism ID, Bacteria VIRIDANS STREPTOCOCCUS  Final      Susceptibility   Viridans streptococcus - MIC*    PENICILLIN 1 INTERMEDIATE Intermediate     CEFTRIAXONE 1 SENSITIVE Sensitive     ERYTHROMYCIN <=0.12 SENSITIVE Sensitive     LEVOFLOXACIN 1 SENSITIVE Sensitive     VANCOMYCIN 0.5 SENSITIVE Sensitive     * VIRIDANS STREPTOCOCCUS  Blood Culture ID Panel (Reflexed)     Status: Abnormal   Collection Time: 05/24/17 11:14 PM  Result Value Ref Range Status   Enterococcus species NOT DETECTED NOT DETECTED Final   Listeria monocytogenes NOT DETECTED NOT DETECTED Final   Staphylococcus species NOT DETECTED NOT DETECTED Final   Staphylococcus aureus NOT DETECTED NOT DETECTED Final   Streptococcus species DETECTED (A) NOT DETECTED Final    Comment: Not Enterococcus species, Streptococcus agalactiae, Streptococcus pyogenes, or Streptococcus pneumoniae. CRITICAL RESULT CALLED TO, READ BACK BY AND VERIFIED WITH: JNARKLE,PHARMD @2115  05/25/17 BY LHOWARD    Streptococcus agalactiae NOT DETECTED NOT DETECTED Final   Streptococcus pneumoniae NOT DETECTED NOT DETECTED Final   Streptococcus pyogenes NOT DETECTED NOT DETECTED Final   Acinetobacter baumannii NOT DETECTED NOT DETECTED Final   Enterobacteriaceae species NOT DETECTED NOT DETECTED Final   Enterobacter cloacae complex NOT DETECTED NOT DETECTED Final   Escherichia coli NOT DETECTED NOT DETECTED Final   Klebsiella oxytoca NOT DETECTED NOT DETECTED Final   Klebsiella pneumoniae NOT DETECTED NOT DETECTED Final   Proteus species NOT DETECTED NOT DETECTED Final   Serratia marcescens NOT DETECTED NOT DETECTED Final   Haemophilus influenzae NOT DETECTED NOT DETECTED Final   Neisseria meningitidis NOT DETECTED NOT DETECTED Final   Pseudomonas  aeruginosa NOT DETECTED NOT DETECTED Final   Candida albicans NOT DETECTED NOT DETECTED Final   Candida glabrata NOT DETECTED NOT DETECTED Final  Candida krusei NOT DETECTED NOT DETECTED Final   Candida parapsilosis NOT DETECTED NOT DETECTED Final   Candida tropicalis NOT DETECTED NOT DETECTED Final  Culture, blood (Routine X 2) w Reflex to ID Panel     Status: None (Preliminary result)   Collection Time: 05/25/17  3:25 PM  Result Value Ref Range Status   Specimen Description BLOOD BLOOD RIGHT HAND  Final   Special Requests IN PEDIATRIC BOTTLE Blood Culture adequate volume  Final   Culture NO GROWTH < 24 HOURS  Final   Report Status PENDING  Incomplete         Radiology Studies: US Renal  Result Date: 05/25/2017 CLINICAL DATA:  Pyelonephritis. EXAM: RENAL / URINARY TRACT ULTRASOUND COMPLETE COMPARISON:  Ultrasound 05/18/2017.  CT 05/08/2017. FINDINGS: Right Kidney: Length: 13.0 cm. Echogenicity within normal limits. No mass or hydronephrosis visualized. Left Kidney: Length: 13.4 cm. Echogenicity within normal limits. No mass or hydronephrosis visualized. Bladder: Appears normal for degree of bladder distention. 4.6 cm cyst left lower pelvis, most likely left ovarian. This is increased slightly in size from prior exam. Two internal echoes are noted on today's exam. This may represent a hemorrhagic cyst. IMPRESSION: 1. No acute renal abnormality. No hydronephrosis or bladder distention. 2. 4.6 cm cyst left lower pelvis. This is most likely ovarian in origin. This has increased slightly in size from prior exam. Faint internal echoes are noted within the cyst on today's exam. This this cyst may be a hemorrhagic cyst. Pregnancy test to exclude ectopic pregnancy suggested. Short-interval follow up ultrasound in 6-12 weeks is recommended, preferably during the week following the patient's normal menses. Electronically Signed   By: Marcello Moores  Register   On: 05/25/2017 12:25        Scheduled  Meds: . enoxaparin (LOVENOX) injection  40 mg Subcutaneous Q24H  . pantoprazole  40 mg Oral Daily   Continuous Infusions: . cefTRIAXone (ROCEPHIN)  IV Stopped (05/26/17 2312)     LOS: 1 day     Cordelia Poche, MD [facility] 05/27/2017, 12:08 PM Pager: [phone]  If 7PM-7AM, please contact night-coverage [email or web address] Password [email or web address] 05/27/2017, 12:08 PM

## 2017-05-27 NOTE — Progress Notes (Signed)
[address]       [place] [address]             [phone]        Joyce Gentzler [place] Medical Record #[id number] Date of Birth: 15-Feb-1976  Referring: ID Dr Megan Salon Primary Care: Sofie Rower, PA-C  Chief Complaint:    Chief Complaint  Patient presents with  . Flank Pain  Patient examined, transthoracic echocardiogram and chest CTA images personally reviewed and counseled with patient  History of Present Illness:     41 year old AA female presents with subacute endocarditis  with strep . And bacteremia and several weeks and months of malaise weight loss and intermittent fever. Her positive blood cultures have converted on IV antibiotics. She has a loud murmur of aortic insufficiency on exam and echocardiogram shows a large vegetation on the right coronary leaflet of the aortic valve with severe aortic insufficiency and good LV function. No other valvular involvement.  Patient had a Panorex x-ray which shows a possible tooth abscess.  Transesophageal echocardiogram is planned.   The patient denies history of rheumatic fever as a child. There is no history of aortic valve-bicuspid valve disease in the family. She has never been told she had a heart murmur before. No cardiac problems with her previous pregnancies and deliveries.   CT scan shows clear lung fields, no pleural effusion. The thoracic aorta is of normal size and caliber.  Current Activity/ Functional Status: Patient has good functional status   Zubrod Score: At the time of surgery this patient's most appropriate activity status/level should be described as: []     0    Normal activity, no symptoms []     1    Restricted in physical strenuous activity but ambulatory, able to do out light work [x]     2    Ambulatory and capable of self care, unable to do work activities, up and about                 more than 50%  Of the time                            []     3    Only limited self care,  in bed greater than 50% of waking hours []     4    Completely disabled, no self care, confined to bed or chair []     5    Moribund  Past Medical History:  Diagnosis Date  . Cystitis 05/2017  . Family history of adverse reaction to anesthesia    " mother has a hard time waking "  . Migraine   . Migraines     Past Surgical History:  Procedure Laterality Date  . WISDOM TOOTH EXTRACTION      Social History   Tobacco Use  Smoking Status Never Smoker  Smokeless Tobacco Never Used    Social History   Substance and Sexual Activity  Alcohol Use No    Social History   Socioeconomic History  . Marital status: Legally Separated    Spouse name: Not on file  . Number of children: Not on file  . Years of education: Not on file  . Highest education level: Not on file  Social Needs  . Financial resource strain: Not on file  . Food insecurity - worry: Not on file  . Food insecurity - inability: Not on file  .  Transportation needs - medical: Not on file  . Transportation needs - non-medical: Not on file  Occupational History  . Not on file  Tobacco Use  . Smoking status: Never Smoker  . Smokeless tobacco: Never Used  Substance and Sexual Activity  . Alcohol use: No  . Drug use: No  . Sexual activity: Yes    Birth control/protection: None  Other Topics Concern  . Not on file  Social History Narrative  . Not on file    No Known Allergies  Current Facility-Administered Medications  Medication Dose Route Frequency Provider Last Rate Last Dose  . acetaminophen (TYLENOL) tablet 650 mg  650 mg Oral Q6H PRN Bennie Pierini, MD       Or  . acetaminophen (TYLENOL) suppository 650 mg  650 mg Rectal Q6H PRN Bennie Pierini, MD      . cefTRIAXone (ROCEPHIN) 2 g in dextrose 5 % 50 mL IVPB  2 g Intravenous Q24H Michel Bickers, MD   Stopped at 05/26/17 2312  . enoxaparin (LOVENOX) injection 40 mg  40 mg Subcutaneous Q24H Bennie Pierini, MD   40 mg at 05/27/17 1111  .  fluconazole (DIFLUCAN) tablet 100 mg  100 mg Oral Daily Esterwood, Amy S, PA-C      . gi cocktail (Maalox,Lidocaine,Donnatal)  30 mL Oral TID PRN Mariel Aloe, MD   30 mL at 05/26/17 2200  . ketorolac (TORADOL) 30 MG/ML injection 30 mg  30 mg Intravenous Q6H PRN Mariel Aloe, MD   30 mg at 05/27/17 1149  . morphine 4 MG/ML injection 2 mg  2 mg Intravenous Q4H PRN Mariel Aloe, MD   2 mg at 05/27/17 1137  . nitroGLYCERIN (NITROSTAT) SL tablet 0.4 mg  0.4 mg Sublingual Q5 min PRN Mariel Aloe, MD   0.4 mg at 05/27/17 1110  . ondansetron (ZOFRAN) tablet 4 mg  4 mg Oral Q6H PRN Bennie Pierini, MD       Or  . ondansetron [facility]) injection 4 mg  4 mg Intravenous Q6H PRN Bennie Pierini, MD   4 mg at 05/25/17 1555  . pantoprazole (PROTONIX) EC tablet 40 mg  40 mg Oral BID AC Esterwood, Amy S, PA-C      . polyethylene glycol (MIRALAX / GLYCOLAX) packet 17 g  17 g Oral Daily PRN Bennie Pierini, MD      . sucralfate (CARAFATE) 1 GM/10ML suspension 1 g  1 g Oral TID PC & HS Esterwood, Amy S, PA-C        Medications Prior to Admission  Medication Sig Dispense Refill Last Dose  . aspirin-acetaminophen-caffeine (EXCEDRIN MIGRAINE) 250-250-65 MG tablet Take 1-2 tablets by mouth every 6 (six) hours as needed for headache.   Past Month at prn  . [EXPIRED] ciprofloxacin (CIPRO) 500 MG tablet Take 1 tablet (500 mg total) by mouth 2 (two) times daily for 7 days. 14 tablet 0 05/23/2017 at Unknown time  . [EXPIRED] metroNIDAZOLE (FLAGYL) 500 MG tablet Take 1 tablet (500 mg total) by mouth 2 (two) times daily for 7 days. 14 tablet 0 05/23/2017 at Unknown time  . ondansetron (ZOFRAN ODT) 4 MG disintegrating tablet Take 1 tablet (4 mg total) by mouth every 8 (eight) hours as needed for nausea or vomiting. 20 tablet 0 unknown at prn    Family History  Problem Relation Age of Onset  . Obesity Mother   . Hypertension Sister      Review of Systems:  Cardiac Review of Systems: Y or  [     ]= no  Chest Pain [ atypical chest pain   ]  Resting SOB [   ] Exertional SOB  [  ]  Orthopnea [  ]   Pedal Edema [   ]    Palpitations [  ] Syncope  [  ]   Presyncope [   ]  General Review of Systems: [Y] = yes [  ]=no Constitional: recent weight change [yes weight loss  ]; anorexia [ yes ]; fatigue [ yes ]; nausea [  ]; night sweats [  ]; fever [place]  ]; or chills [  ]                                                               Dental: poor dentition[ yes ]; Last Dentist visit: December 2017 molar extraction  Eye : blurred vision [  ]; diplopia [   ]; vision changes [  ];  Amaurosis fugax[  ]; Resp: cough [  ];  wheezing[  ];  hemoptysis[  ]; shortness of breath[  ]; paroxysmal nocturnal dyspnea[  ]; dyspnea on exertion[  ]; or orthopnea[  ];  GI:  gallstones[  ], vomiting[  ];  dysphagia[  ]; melena[  ];  hematochezia [  ]; heartburn[  ];   Hx of  Colonoscopy[  ]; GU: kidney stones [  ]; hematuria[  ];   dysuria [  ];  nocturia[  ];  history of     obstruction [  ]; urinary frequency [  ]History of urinary tract infection earlier this year-Escherichia coli             Skin: rash, swelling[  ];, hair loss[  ];  peripheral edema[  ];  or itching[  ]; Musculosketetal: myalgias[  ];  joint swelling[  ];  joint erythema[  ];  joint pain[  ];  back pain[  ];  Heme/Lymph: bruising[  ];  bleeding[  ];  anemia[  ];  Neuro: TIA[  ];  headaches[ yes migraines ];  stroke[  ];  vertigo[  ];  seizures[  ];   paresthesias[  ];  difficulty walking[  ];  Psych:depression[  ]; anxiety[  ];  Endocrine: diabetes[  ];  thyroid dysfunction[  ];  Immunizations: Flu [  ]; Pneumococcal[  ];  Other:Left-hand-dominant  Physical Exam: BP 139/67 (BP Location: Left Arm)   Pulse 96   Temp 98.3 F (36.8 C) (Oral)   Resp 18   Ht 5\' 4"  (1.626 m) Comment: Simultaneous filing. User may not have seen previous data.  Wt 150 lb (68 kg) Comment: Simultaneous filing. User may not have seen previous data.  SpO2 96%   BMI  25.75 kg/m        Physical Exam  General: Pleasant middle-aged AA female no acute distress, anxious HEENT: Normocephalic pupils equal , dentition poor Neck: Supple without JVD, adenopathy, or bruit Chest: Clear to auscultation, symmetrical breath sounds, no rhonchi, no tenderness             or deformity Cardiovascular: Regular rate and rhythm, 3/6 diastolic AI murmur, no gallop, peripheral pulses             palpable in all  extremities Abdomen:  Soft, nontender, no palpable mass or organomegaly Extremities: Warm, well-perfused, no clubbing cyanosis edema or tenderness,              no venous stasis changes of the legs Rectal/GU: Deferred Neuro: Grossly non--focal and symmetrical throughout Skin: Clean and dry without rash or ulceration    Diagnostic Studies & Laboratory data:     Recent Radiology Findings:   Dg Orthopantogram  Result Date: 05/27/2017 CLINICAL DATA:  Encounter for bacteremia. EXAM: ORTHOPANTOGRAM/PANORAMIC COMPARISON:  None. FINDINGS: The patient is missing multiple teeth. Dental amalgam is identified. There is a large carie involving the left upper fourth tooth. The crown of the right upper third tooth is missing and there is a large carie within the base of this tooth.Lucency around the root of the right upper third is suspected which may represent abscess. The crown of the right upper fourth tooth is also missing. The visualized portions of the maxillary sinuses appear clear. IMPRESSION: 1. Suspect periapical abscess involving the right upper third tooth. 2. Poor dentition with loss of multiple teeth as well as bilateral upper tooth dental caries. Electronically Signed   By: Kerby Moors M.D.   On: 05/27/2017 14:36     I have independently reviewed the above radiologic studies.  Recent Lab Findings: Lab Results  Component Value Date   WBC 6.8 05/25/2017   HGB 8.8 (L) 05/25/2017   HCT 28.7 (L) 05/25/2017   PLT 254 05/25/2017   GLUCOSE 91 05/25/2017   ALT  10 (L) 05/24/2017   AST 15 05/24/2017   NA 134 (L) 05/25/2017   K 3.7 05/25/2017   CL 105 05/25/2017   CREATININE 0.60 05/25/2017   BUN 7 05/25/2017   CO2 24 05/25/2017   TSH 3.337 05/25/2017      Assessment / Plan:     Subacute bacterial endocarditis of the aortic valve with severe aortic insufficiency    Strep viridans bacteremia with significant dental disease as the probable source. Bacteremia now cleared with IV antibiotics     Probable bicuspid aortic valve, normal size aortic root by CTA, severe structural damage to the aortic valve which would require replacement after dental evaluation and treatment.  I will refer the patient to the dental clinic for evaluation. TEE is planned soon. The patient will need coronary angiograms or cardiac CTA to assess for coronary disease prior to valve surgery.  Will follow.     @ME1 @ 05/27/2017 5:34 PM

## 2017-05-28 ENCOUNTER — Inpatient Hospital Stay (HOSPITAL_COMMUNITY): Payer: 59

## 2017-05-28 ENCOUNTER — Encounter (HOSPITAL_COMMUNITY): Payer: Self-pay | Admitting: Dentistry

## 2017-05-28 DIAGNOSIS — I38 Endocarditis, valve unspecified: Secondary | ICD-10-CM

## 2017-05-28 DIAGNOSIS — B954 Other streptococcus as the cause of diseases classified elsewhere: Secondary | ICD-10-CM

## 2017-05-28 DIAGNOSIS — R0789 Other chest pain: Secondary | ICD-10-CM

## 2017-05-28 DIAGNOSIS — R7881 Bacteremia: Secondary | ICD-10-CM

## 2017-05-28 DIAGNOSIS — I330 Acute and subacute infective endocarditis: Secondary | ICD-10-CM

## 2017-05-28 DIAGNOSIS — R079 Chest pain, unspecified: Secondary | ICD-10-CM

## 2017-05-28 LAB — FOLATE RBC
Folate, Hemolysate: 330.1 ng/mL
Folate, RBC: 1183 ng/mL (ref >498)
Hematocrit: 27.9 % — ABNORMAL LOW (ref 34.0–46.6)

## 2017-05-28 LAB — TROPONIN I: Troponin I: <0.03 ng/mL (ref <0.03)

## 2017-05-28 MED ORDER — GENTAMICIN SULFATE 40 MG/ML IJ SOLN
200.0000 mg | INTRAVENOUS | Status: DC
  Administered 2017-05-28 – 2017-06-03 (×7): 200 mg via INTRAVENOUS
  Filled 2017-05-28 (×8): qty 5

## 2017-05-28 NOTE — Progress Notes (Signed)
Called to patients room this morning around 340 am. Patient was sitting up in bed crying and rocking back and forth stating "its hurts so bad" GI cocktail given with only alittle relief. 1 Nitro was given again with only a small amount of relief. 2 mg, IV morphine given alittle early, patient did experience relief at this point. Only a small window of time between each intervention, so it's impossible to actually know which individual one gave the most relief or if it was simply the combination of the 3. At the time patients comfort was the greatest concern. Patient now resting comfortably. Will continue to monitor.

## 2017-05-28 NOTE — H&P (View-Only) (Signed)
Cardiology Consultation:   Patient ID: Krystal Shaffer; [id number]; 31-May-1976   Admit date: 05/24/2017 Date of Consult: 05/28/2017  Primary Care Provider: Sofie Rower, PA-C Primary Cardiologist: NA Primary Electrophysiologist: NA  Patient Profile:   Krystal Shaffer is a 41 y.o. female with a hx of migraines and recurrent UTI who is being seen today for the evaluation for cardiac clearance for aortic valve surgery s/p endocarditis at the request of Dr. Prescott Gum.    History of Present Illness:   Krystal Shaffer is a 41 yo F who initially presented to the ED 05/17/17 for failed oral antibiotic therapy for E.Coli positive UTI. She was subsequently admitted and treated with IV antibiotics and discharged on Cipro and Flagyl.   She was then re-admitted on 05/25/17 with a continuing fever, chills, malaise, and weight loss with evidence of bacteremia, now cleared with IV antibiotics as of 05/26/17. At that time, she began having some associated chest pain with her symptoms. Due to her recent infections and chest pain, an Echocardiogram was ordered on 05/25/17 which revealed an EF of 60% and aortic valve vegetation consistent with endocarditis. ID and CVTS were consulted.   Her admission blood cultures have grown viridans strep, with repeat cultures negative thus far. She is felt to have subacute streptococcal aortic valve endocarditis per ID.   CVTS recommends aortic valve surgery with inpatient cardiac and dental clearance including dental extraction before surgery scheduling.   [facility] xrays were completed due to dental disease which showed tooth abscesses. Dr. Enrique Sack consulted per IM.   A TEE has been set up for tomorrow (05/29/17).   Cardiology to schedule patient for coronary angiogram/coronary CT for tomorrow after TEE.   She denies a history of rheumatic heart disease or history of aortic valve or CAD disease in her family. She denies cardiac problems with her pregnancies.     Past Medical History:  Diagnosis Date  . Cystitis 05/2017  . Family history of adverse reaction to anesthesia    " mother has a hard time waking "  . Migraine   . Migraines     Past Surgical History:  Procedure Laterality Date  . WISDOM TOOTH EXTRACTION       Prior to Admission medications   Medication Sig Start Date End Date Taking? Authorizing Provider  aspirin-acetaminophen-caffeine (EXCEDRIN MIGRAINE) [id number] MG tablet Take 1-2 tablets by mouth every 6 (six) hours as needed for headache.   Yes [provider]  ondansetron (ZOFRAN ODT) 4 MG disintegrating tablet Take 1 tablet (4 mg total) by mouth every 8 (eight) hours as needed for nausea or vomiting. 05/08/17  Yes Duffy Bruce, MD    Inpatient Medications: Scheduled Meds: . enoxaparin (LOVENOX) injection  40 mg Subcutaneous Q24H  . fluconazole  100 mg Oral Daily  . pantoprazole  40 mg Oral BID AC  . sucralfate  1 g Oral TID PC & HS   Continuous Infusions: . cefTRIAXone (ROCEPHIN)  IV Stopped (05/27/17 2322)   PRN Meds: acetaminophen **OR** acetaminophen, gi cocktail, ketorolac, morphine injection, nitroGLYCERIN, ondansetron **OR** ondansetron (ZOFRAN) IV, polyethylene glycol  Allergies:   No Known Allergies  Social History:   Social History   Socioeconomic History  . Marital status: Legally Separated    Spouse name: Not on file  . Number of children: Not on file  . Years of education: Not on file  . Highest education level: Not on file  Social Needs  . Financial resource strain: Not on file  . Food  insecurity - worry: Not on file  . Food insecurity - inability: Not on file  . Transportation needs - medical: Not on file  . Transportation needs - non-medical: Not on file  Occupational History  . Not on file  Tobacco Use  . Smoking status: Never Smoker  . Smokeless tobacco: Never Used  Substance and Sexual Activity  . Alcohol use: No  . Drug use: No  . Sexual activity: Yes    Birth  control/protection: None  Other Topics Concern  . Not on file  Social History Narrative  . Not on file    Family History:   Family History  Problem Relation Age of Onset  . Obesity Mother   . Hypertension Sister    Family Status:  Family Status  Relation Name Status  . Mother  Alive  . Sister  Alive  . Brother  Alive    ROS:  Please see the history of present illness.  All other ROS reviewed and negative.     Physical Exam/Data:   Vitals:   05/27/17 0447 05/27/17 1417 05/27/17 2125 05/28/17 0455  BP: (!) 160/61 139/67 (!) 155/61 (!) 131/53  Pulse: (!) 104 96 91 89  Resp: 18 18 19 17   Temp: 98.3 F (36.8 C) 98.3 F (36.8 C) 98.5 F (36.9 C) 97.8 F (36.6 C)  TempSrc: Oral Oral Oral   SpO2: 98% 96% 98% 97%  Weight:      Height:        Intake/Output Summary (Last 24 hours) at 05/28/2017 1206 Last data filed at 05/28/2017 1137 Gross per 24 hour  Intake 530 ml  Output -  Net 530 ml   Filed Weights   05/24/17 1405  Weight: 150 lb (68 kg)   Body mass index is 25.75 kg/m.   General: Well developed, well nourished, NAD Skin: Warm, dry, intact  Head: Normocephalic, atraumatic, clear, moist mucus membranes. Neck: Negative for carotid bruits. No JVD Lungs:Clear to ausculation bilaterally. No wheezes, rales, or rhonchi. Breathing is unlabored. Cardiovascular: RRR with S1 S2. 3/6 diastolic murmur. No rubs, gallops, or LV heave appreciated. Abdomen: Soft, non-tender, non-distended with normoactive bowel sounds. No obvious abdominal masses. MSK: Strength and tone appear normal for age. 5/5 in all extremities Extremities: No edema. No clubbing or cyanosis. DP/PT pulses 2+ bilaterally Neuro: Alert and oriented. No focal deficits. No facial asymmetry. MAE spontaneously. Psych: Responds to questions appropriately with normal affect.    EKG:  The EKG was personally reviewed and demonstrates: 05/26/2017- NSR 97 Telemetry:  Telemetry was personally reviewed and  demonstrates: 05/28/2017- NSR  Relevant CV Studies:  ECHO: 05/25/2017 Study Conclusions  - Left ventricle: The cavity size was normal. Systolic function was   normal. The estimated ejection fraction was in the range of 60%   to 65%. Wall motion was normal; there were no regional wall   motion abnormalities. Doppler parameters are consistent with   abnormal left ventricular relaxation (grade 1 diastolic   dysfunction). - Aortic valve: Trileaflet; moderately thickened leaflets with 1cm   x 1cm mass on the right coronary cusp, mild mobility, suggestive   of vegetation. If no signs of infection, consider fibroelastoma.   There was severe stenosis. Valve area (VTI): 1.32 cm^2. Valve   area (Vmax): 1.41 cm^2. Valve area (Vmean): 1.28 cm^2.  ------------------------------------------------------------------- Study data:  No prior study was available for comparison.  Study status:  Routine.  Procedure:  The patient reported no pain pre or post test. Transthoracic echocardiography. Image quality  was adequate.  Study completion:  There were no complications. Transthoracic echocardiography.  M-mode, complete 2D, spectral Doppler, and color Doppler.  Birthdate:  Patient birthdate: 1975/09/21.  Age:  Patient is 41 yr old.  Sex:  Gender: female. BMI: 25 kg/m^2.  Blood pressure:     120/54  Patient status: Inpatient.  Study date:  Study date: 05/25/2017. Study time: 09:48 AM.  Location:  Bedside.  -------------------------------------------------------------------  ------------------------------------------------------------------- Left ventricle:  The cavity size was normal. Systolic function was normal. The estimated ejection fraction was in the range of 60% to 65%. Wall motion was normal; there were no regional wall motion abnormalities. Doppler parameters are consistent with abnormal left ventricular relaxation (grade 1 diastolic  dysfunction).  ------------------------------------------------------------------- Aortic valve:  Trileaflet; moderately thickened leaflets with 1cm x 1cm mass on the right coronary cusp, mild mobility, suggestive of vegetation. If no signs of infection, consider fibroelastoma. Mobility was not restricted.  Doppler:   There was severe stenosis.   There was no regurgitation.    VTI ratio of LVOT to aortic valve: 0.42. Valve area (VTI): 1.32 cm^2. Indexed valve area (VTI): 0.74 cm^2/m^2. Peak velocity ratio of LVOT to aortic valve: 0.45. Valve area (Vmax): 1.41 cm^2. Indexed valve area (Vmax): 0.79 cm^2/m^2. Mean velocity ratio of LVOT to aortic valve: 0.41. Valve area (Vmean): 1.28 cm^2. Indexed valve area (Vmean): 0.72 cm^2/m^2. Mean gradient (S): 15 mm Hg. Peak gradient (S): 24 mm Hg.  ------------------------------------------------------------------- Aorta:  Aortic root: The aortic root was normal in size.  ------------------------------------------------------------------- Mitral valve:   Structurally normal valve.   Mobility was not restricted.  Doppler:  Transvalvular velocity was within the normal range. There was no evidence for stenosis. There was no regurgitation.    Peak gradient (D): 8 mm Hg.  ------------------------------------------------------------------- Left atrium:  The atrium was normal in size.  ------------------------------------------------------------------- Right ventricle:  The cavity size was normal. Wall thickness was normal. Systolic function was normal.  ------------------------------------------------------------------- Pulmonic valve:   Poorly visualized.  Structurally normal valve. Cusp separation was normal.  Doppler:  Transvalvular velocity was within the normal range. There was no evidence for stenosis. There was no regurgitation.  ------------------------------------------------------------------- Tricuspid valve:   Structurally  normal valve.    Doppler: Transvalvular velocity was within the normal range. There was no regurgitation.  ------------------------------------------------------------------- Pulmonary artery:   The main pulmonary artery was normal-sized. Systolic pressure was within the normal range.  ------------------------------------------------------------------- Right atrium:  The atrium was normal in size.  ------------------------------------------------------------------- Pericardium:  There was no pericardial effusion.  ------------------------------------------------------------------- Systemic veins: Inferior vena cava: The vessel was normal in size.  ------------------------------------------------------------------- Measurements   Left ventricle                           Value          Reference  LV ID, ED, PLAX chordal          (H)     55.2  mm       43 - 52  LV ID, ES, PLAX chordal          (H)     38.7  mm       23 - 38  LV fx shortening, PLAX chordal           30    %        >=29  LV PW thickness, ED  11.2  mm       ----------  IVS/LV PW ratio, ED                      1.04           <=1.3  Stroke volume, 2D                        71    ml       ----------  Stroke volume/bsa, 2D                    40    ml/m^2   ----------  LV e&', lateral                           9.03  cm/s     ----------  LV E/e&', lateral                         15.39          ----------  LV e&', medial                            8.38  cm/s     ----------  LV E/e&', medial                          16.59          ----------  LV e&', average                           8.71  cm/s     ----------  LV E/e&', average                         15.97          ----------    Ventricular septum                       Value          Reference  IVS thickness, ED                        11.6  mm       ----------    LVOT                                     Value          Reference  LVOT ID, S                                20    mm       ----------  LVOT area                                3.14  cm^2     ----------  LVOT peak velocity, S                    111   cm/s     ----------  LVOT mean velocity, S                    73.8  cm/s     ----------  LVOT VTI, S                              22.6  cm       ----------    Aortic valve                             Value          Reference  Aortic valve peak velocity, S            247   cm/s     ----------  Aortic valve mean velocity, S            181   cm/s     ----------  Aortic valve VTI, S                      53.7  cm       ----------  Aortic mean gradient, S                  15    mm Hg    ----------  Aortic peak gradient, S                  24    mm Hg    ----------  VTI ratio, LVOT/AV                       0.42           ----------  Aortic valve area, VTI                   1.32  cm^2     ----------  Aortic valve area/bsa, VTI               0.74  cm^2/m^2 ----------  Velocity ratio, peak, LVOT/AV            0.45           ----------  Aortic valve area, peak velocity         1.41  cm^2     ----------  Aortic valve area/bsa, peak              0.79  cm^2/m^2 ----------  velocity  Velocity ratio, mean, LVOT/AV            0.41           ----------  Aortic valve area, mean velocity         1.28  cm^2     ----------  Aortic valve area/bsa, mean              0.72  cm^2/m^2 ----------  velocity    Aorta                                    Value          Reference  Aortic root ID, ED                       30    mm       ----------  Left atrium                              Value          Reference  LA ID, A-P, ES                           34    mm       ----------  LA ID/bsa, A-P                           1.91  cm/m^2   <=2.2  LA volume, S                             48    ml       ----------  LA volume/bsa, S                         27    ml/m^2   ----------  LA volume, ES, 1-p A4C                   41.7  ml       ----------  LA  volume/bsa, ES, 1-p A4C               23.5  ml/m^2   ----------  LA volume, ES, 1-p A2C                   50.2  ml       ----------  LA volume/bsa, ES, 1-p A2C               28.2  ml/m^2   ----------    Mitral valve                             Value          Reference  Mitral E-wave peak velocity              139   cm/s     ----------  Mitral deceleration time                 158   ms       150 - 230  Mitral peak gradient, D                  8     mm Hg    ----------    Right atrium                             Value          Reference  RA ID, S-I, ES, A4C                      48.5  mm       34 - 49  RA area, ES, A4C                         12    cm^2     8.3 - 19.5  RA volume, ES, A/L  24.2  ml       ----------  RA volume/bsa, ES, A/L                   13.6  ml/m^2   ----------    Right ventricle                          Value          Reference  RV ID, minor axis, ED, A4C base          41.1  mm       ----------  RV ID, minor axis, ED, A4C mid           31.7  mm       ----------  RV ID, major axis, ED, A4C               79.1  mm       55 - 91  TAPSE                                    22.8  mm       ----------  RV s&', lateral, S                        14.6  cm/s     ----------  Legend: (L)  and  (H)  mark values outside specified reference range.   CATH: NA  Laboratory Data:  Chemistry Recent Labs  Lab 05/24/17 1433 05/25/17 0536  NA 133* 134*  K 3.5 3.7  CL 103 105  CO2 22 24  GLUCOSE 97 91  BUN 7 7  CREATININE 0.63 0.60  CALCIUM 8.7* 7.8*  GFRNONAA >60 >60  GFRAA >60 >60  ANIONGAP 8 5    Total Protein  Date Value Ref Range Status  05/24/2017 7.4 6.5 - 8.1 g/dL Final   Albumin  Date Value Ref Range Status  05/24/2017 2.8 (L) 3.5 - 5.0 g/dL Final   AST  Date Value Ref Range Status  05/24/2017 15 15 - 41 U/L Final   ALT  Date Value Ref Range Status  05/24/2017 10 (L) 14 - 54 U/L Final   Alkaline Phosphatase  Date Value Ref Range  Status  05/24/2017 64 38 - 126 U/L Final   Total Bilirubin  Date Value Ref Range Status  05/24/2017 0.5 0.3 - 1.2 mg/dL Final   Hematology Recent Labs  Lab 05/24/17 1433 05/25/17 0034 05/25/17 0536  WBC 12.3*  --  6.8  RBC 3.93 3.39* 3.47*  HGB 10.1*  --  8.8*  HCT 32.2*  --  28.7*  MCV 81.9  --  82.7  MCH 25.7*  --  25.4*  MCHC 31.4  --  30.7  RDW 15.1  --  15.2  PLT 298  --  254   Cardiac EnzymesNo results for input(s): TROPONINI in the last 168 hours.  Recent Labs  Lab 05/24/17 1450 05/24/17 2258  TROPIPOC 0.00 0.00    BNPNo results for input(s): BNP, PROBNP in the last 168 hours.  DDimer  Recent Labs  Lab 05/24/17 1731  DDIMER 0.51*   TSH:  Lab Results  Component Value Date   TSH 3.337 05/25/2017   Lipids:No results found for: CHOL, HDL, LDLCALC, LDLDIRECT, TRIG, CHOLHDL HgbA1c:No results found for: HGBA1C  Radiology/Studies:  Dg Orthopantogram  Result  Date: 05/27/2017 CLINICAL DATA:  Encounter for bacteremia. EXAM: ORTHOPANTOGRAM/PANORAMIC COMPARISON:  None. FINDINGS: The patient is missing multiple teeth. Dental amalgam is identified. There is a large carie involving the left upper fourth tooth. The crown of the right upper third tooth is missing and there is a large carie within the base of this tooth.Lucency around the root of the right upper third is suspected which may represent abscess. The crown of the right upper fourth tooth is also missing. The visualized portions of the maxillary sinuses appear clear. IMPRESSION: 1. Suspect periapical abscess involving the right upper third tooth. 2. Poor dentition with loss of multiple teeth as well as bilateral upper tooth dental caries. Electronically Signed   By: Kerby Moors M.D.   On: 05/27/2017 14:36   Dg Chest 2 View  Result Date: 05/24/2017 CLINICAL DATA:  Chest pain EXAM: CHEST  2 VIEW COMPARISON:  May 08, 2017 FINDINGS: Heart is mildly enlarged with pulmonary vascularity within normal limits. No  edema or consolidation. No adenopathy. No evident bone lesions. IMPRESSION: Mild cardiac enlargement.  No edema or consolidation. Electronically Signed   By: Lowella Grip III M.D.   On: 05/24/2017 14:38   Ct Angio Chest Pe W And/or Wo Contrast  Result Date: 05/24/2017 CLINICAL DATA:  Right-sided chest pain radiating to the back for 2 days. Dyspnea. EXAM: CT ANGIOGRAPHY CHEST WITH CONTRAST TECHNIQUE: Multidetector CT imaging of the chest was performed using the standard protocol during bolus administration of intravenous contrast. Multiplanar CT image reconstructions and MIPs were obtained to evaluate the vascular anatomy. CONTRAST:  164mL ISOVUE-370 IOPAMIDOL (ISOVUE-370) INJECTION 76% COMPARISON:  Radiographs 05/24/2017 at 14:31 FINDINGS: Cardiovascular: Satisfactory opacification of the pulmonary arteries to the segmental level. No evidence of pulmonary embolism. Normal heart size. No pericardial effusion. The thoracic aorta is normal in caliber and intact. Mediastinum/Nodes: No enlarged mediastinal, hilar, or axillary lymph nodes. Thyroid gland, trachea, and esophagus demonstrate no significant findings. Lungs/Pleura: Lungs are clear. No pleural effusion or pneumothorax. Upper Abdomen: No acute abnormality. Musculoskeletal: No chest wall abnormality. No acute or significant osseous findings. Review of the MIP images confirms the above findings. IMPRESSION: Negative for pulmonary embolism.  No significant abnormality. Electronically Signed   By: Andreas Newport M.D.   On: 05/24/2017 20:32   US Renal  Result Date: 05/25/2017 CLINICAL DATA:  Pyelonephritis. EXAM: RENAL / URINARY TRACT ULTRASOUND COMPLETE COMPARISON:  Ultrasound 05/18/2017.  CT 05/08/2017. FINDINGS: Right Kidney: Length: 13.0 cm. Echogenicity within normal limits. No mass or hydronephrosis visualized. Left Kidney: Length: 13.4 cm. Echogenicity within normal limits. No mass or hydronephrosis visualized. Bladder: Appears normal for  degree of bladder distention. 4.6 cm cyst left lower pelvis, most likely left ovarian. This is increased slightly in size from prior exam. Two internal echoes are noted on today's exam. This may represent a hemorrhagic cyst. IMPRESSION: 1. No acute renal abnormality. No hydronephrosis or bladder distention. 2. 4.6 cm cyst left lower pelvis. This is most likely ovarian in origin. This has increased slightly in size from prior exam. Faint internal echoes are noted within the cyst on today's exam. This this cyst may be a hemorrhagic cyst. Pregnancy test to exclude ectopic pregnancy suggested. Short-interval follow up ultrasound in 6-12 weeks is recommended, preferably during the week following the patient's normal menses. Electronically Signed   By: Marcello Moores  Register   On: 05/25/2017 12:25    Assessment and Plan:   1. Endocarditis/pre-surgical clearance: -Echo 05/25/2017 revealed aortic insufficiency with a large vegetation on the  right coronary leaflet of the aortic valve with severe aortic insufficiency and adequate LV function. EF 60-65% -Pt schedule for TEE-Echo tomorrow (05/29/17) as part of pre-surgical clearance and evaluation -Blood cultures clear now  -Dental abscess confirmed per [facility] xray. Will need tooth extraction prior to AVR.  -Will schedule cardiac CT for tomorrow to evaluate for coronary artery disease  -Will start lopressor 25 mg PO BID to slow her rate down for scan   Principal Problem:   Sepsis ([employer]) Active Problems:   Bacteremia due to Streptococcus   Aortic valve endocarditis   Diastolic murmur   Bacterial vaginosis   Atypical chest pain   Temporal headache   Streptococcal sepsis ([place])   For questions or updates, please contact [facility] Please consult [email or web address] for contact info under Cardiology/STEMI.   Signed, Kathyrn Drown, NP  05/28/2017 12:06 PM Patient seen and examined and history reviewed. Agree with above findings and plan. 41 yo BF admitted with  streptococcal AV endocarditis. No prior cardiac history. No history of HTN, DM, HLD. Has smoked periodically in the past. No chest pain  On exam she is hypertensive. HR 90s NSR No JVD.  Lungs are clear. CV RRR with gr 2/6 SEM RUSB and gr 3/6 diastolic murmur RUSB>> LSB.  Ecg shows NSR with poor R wave progression. I have personally reviewed and interpreted this study.   Echo shows severe AI with 1 cm valve vegetation. I have personally reviewed and interpreted this study.  Impression: streptococcal AV endocarditis with severe AI. Needs coronary clearance prior to AV surgery. She is at low risk- only risk factor is intermittent smoking. No chest pain. I would favor noninvasive evaluation with coronary CTA. I feel this is less risky and with excellent negative predictive value. Will initiate beta blocker therapy today to try and optimize HR for scan . Will schedule for tomorrow.    Peter Martinique, [place] 05/28/2017 2:13 PM

## 2017-05-28 NOTE — Progress Notes (Signed)
          Daily Rounding Note  05/28/2017, 11:34 AM  LOS: 2 days   SUBJECTIVE:   Chief complaint:  Shooting pain in esophagus, relived mostly with Gi cocktail but it did not relieve episode this AM.     OBJECTIVE:         Vital signs in last 24 hours:    Temp:  [97.8 F (36.6 C)-98.5 F (36.9 C)] 97.8 F (36.6 C) (12/17 0455) Pulse Rate:  [89-96] 89 (12/17 0455) Resp:  [17-19] 17 (12/17 0455) BP: (131-155)/(53-67) 131/53 (12/17 0455) SpO2:  [96 %-98 %] 97 % (12/17 0455) Last BM Date: 05/27/17 Filed Weights   05/24/17 1405  Weight: 68 kg (150 lb)   General: pleasant, looks well.  comfortable   Heart: RRR.  harsh syst murmer Chest: clear bil.  No labored breathing Abdomen: soft, NT, ND.  Active BS  Extremities: no CCE Neuro/Psych:  Alert, calm, fully oriented.  No deficits.     ASSESMENT:   *  Atypical chest pain. Still having episodes of shooting pain in esophagus, not necessarily  po related On empiric BID PPI, Carafate, Diflucan.     *  Strep Viridans sub-acute bacterial endocarditis. Dental dz is likely source.  Severe aortic insufficiency due to structural damage from endocarditis.  Will require valve replacement but dental issues need addressing first.  Cardiac cath also needed.    *  Recent admissions and courses of abx for E coli pyelonephritis.  11/27, 12/6.   *  Yeast vaginitis suspected (not sonfirmewd) on pelvic exam 12/6.   Received Diflucan in ED that day but not continued at discharge.  Wet prep revealed WBCs and clue cells.  No trich and no yeast.  .        PLAN   *  Staying in hospital for dental consult and extractions, cardiac cath and ultimately AVR.    *  Dr Carlean Purl will see pt later today.      Azucena Freed  05/28/2017, 11:34 AM Pager: [phone]    [facility] Attending   I have taken an interval history, reviewed the chart and examined the patient. I agree with the Advanced  Practitioner's note, impression and recommendations.   She tells me NTG helps pain  Sounds like dysmotility - ? If it could be related to the endocarditis  EGD likely low yield  Ba swallow would be my next step  She is having TEE tomorrow AM so wait for that result and tincture of time.  Gatha Mayer, MD, [facility] [phone] (pager) 05/28/2017 4:36 PM

## 2017-05-28 NOTE — Progress Notes (Addendum)
Subjective:  ? Angina vs esophageal spasms  Antibiotics:  Anti-infectives (From admission, onward)   Start     Dose/Rate Route Frequency Ordered Stop   05/27/17 1515  fluconazole (DIFLUCAN) tablet 100 mg     100 mg Oral Daily 05/27/17 1515 06/10/17 0959   05/25/17 2200  cefTRIAXone (ROCEPHIN) 2 g in dextrose 5 % 50 mL IVPB     2 g 100 mL/hr over 30 Minutes Intravenous Every 24 hours 05/25/17 2125     05/25/17 0026  metroNIDAZOLE (FLAGYL) tablet 500 mg  Status:  Discontinued     500 mg Oral Every 12 hours 05/25/17 0026 05/26/17 1538   05/24/17 2300  cefTRIAXone (ROCEPHIN) 1 g in dextrose 5 % 50 mL IVPB     1 g 100 mL/hr over 30 Minutes Intravenous  Once 05/24/17 2247 05/24/17 2349   05/24/17 2300  vancomycin (VANCOCIN) IVPB 1000 mg/200 mL premix     1,000 mg 200 mL/hr over 60 Minutes Intravenous  Once 05/24/17 2247 05/25/17 0018      Medications: Scheduled Meds: . enoxaparin (LOVENOX) injection  40 mg Subcutaneous Q24H  . fluconazole  100 mg Oral Daily  . pantoprazole  40 mg Oral BID AC  . sucralfate  1 g Oral TID PC & HS   Continuous Infusions: . cefTRIAXone (ROCEPHIN)  IV Stopped (05/27/17 2322)   PRN Meds:.acetaminophen **OR** acetaminophen, gi cocktail, ketorolac, morphine injection, nitroGLYCERIN, ondansetron **OR** ondansetron (ZOFRAN) IV, polyethylene glycol    Objective: Weight change:   Intake/Output Summary (Last 24 hours) at 05/28/2017 1741 Last data filed at 05/28/2017 1726 Gross per 24 hour  Intake 650 ml  Output -  Net 650 ml   Blood pressure (!) 159/69, pulse 95, temperature 98 F (36.7 C), temperature source Oral, resp. rate 18, height 5\' 4"  (1.626 m), weight 150 lb (68 kg), SpO2 97 %. Temp:  [97.8 F (36.6 C)-98.5 F (36.9 C)] 98 F (36.7 C) (12/17 1341) Pulse Rate:  [89-95] 95 (12/17 1341) Resp:  [17-19] 18 (12/17 1341) BP: (131-159)/(53-69) 159/69 (12/17 1341) SpO2:  [97 %-98 %] 97 % (12/17 1341)  Physical Exam: General: Alert  and awake, oriented x3, not in any acute distress. HEENT: anicteric sclera, EOMI CVS regular rate, normal r,  SEM and diastolic murmurs loudest at RUSB with radiatoin to carotid Chest: clear to auscultation bilaterally, no wheezing, rales or rhonchi Abdomen: soft nontender, nondistended, normal bowel sounds, Extremities: no  clubbing or edema noted bilaterally Skin: no rashes Neuro: nonfocal  CBC:  CBC Latest Ref Rng & Units 05/25/2017 05/25/2017 05/24/2017  WBC 4.0 - 10.5 K/uL 6.8 - 12.3(H)  Hemoglobin 12.0 - 15.0 g/dL 8.8(L) - 10.1(L)  Hematocrit 36.0 - 46.0 % 28.7(L) 27.9(L) 32.2(L)  Platelets 150 - 400 K/uL 254 - 298      BMET No results for input(s): NA, K, CL, CO2, GLUCOSE, BUN, CREATININE, CALCIUM in the last 72 hours.   Liver Panel  No results for input(s): PROT, ALBUMIN, AST, ALT, ALKPHOS, BILITOT, BILIDIR, IBILI in the last 72 hours.     Sedimentation Rate No results for input(s): ESRSEDRATE in the last 72 hours. C-Reactive Protein No results for input(s): CRP in the last 72 hours.  Micro Results: Recent Results (from the past 720 hour(s))  Urine culture     Status: Abnormal   Collection Time: 05/08/17  6:24 PM  Result Value Ref Range Status   Specimen Description URINE, CLEAN CATCH  Final   Special  Requests Normal  Final   Culture >=100,000 COLONIES/mL ESCHERICHIA COLI (A)  Final   Report Status 05/10/2017 FINAL  Final   Organism ID, Bacteria ESCHERICHIA COLI (A)  Final      Susceptibility   Escherichia coli - MIC*    AMPICILLIN >=32 RESISTANT Resistant     CEFAZOLIN 16 SENSITIVE Sensitive     CEFTRIAXONE <=1 SENSITIVE Sensitive     CIPROFLOXACIN <=0.25 SENSITIVE Sensitive     GENTAMICIN <=1 SENSITIVE Sensitive     IMIPENEM <=0.25 SENSITIVE Sensitive     NITROFURANTOIN <=16 SENSITIVE Sensitive     TRIMETH/SULFA <=20 SENSITIVE Sensitive     AMPICILLIN/SULBACTAM >=32 RESISTANT Resistant     PIP/TAZO <=4 SENSITIVE Sensitive     Extended ESBL NEGATIVE  Sensitive     * >=100,000 COLONIES/mL ESCHERICHIA COLI  Blood culture (routine x 2)     Status: None   Collection Time: 05/17/17  8:55 PM  Result Value Ref Range Status   Specimen Description BLOOD LEFT ANTECUBITAL  Final   Special Requests   Final    BOTTLES DRAWN AEROBIC AND ANAEROBIC Blood Culture adequate volume   Culture NO GROWTH 5 DAYS  Final   Report Status 05/22/2017 FINAL  Final  Blood culture (routine x 2)     Status: None   Collection Time: 05/17/17  9:05 PM  Result Value Ref Range Status   Specimen Description BLOOD RIGHT ARM  Final   Special Requests   Final    BOTTLES DRAWN AEROBIC AND ANAEROBIC Blood Culture adequate volume   Culture NO GROWTH 5 DAYS  Final   Report Status 05/22/2017 FINAL  Final  Wet prep, genital     Status: Abnormal   Collection Time: 05/17/17 10:07 PM  Result Value Ref Range Status   Yeast Wet Prep HPF POC NONE SEEN NONE SEEN Final   Trich, Wet Prep NONE SEEN NONE SEEN Final   Clue Cells Wet Prep HPF POC PRESENT (A) NONE SEEN Final   WBC, Wet Prep HPF POC FEW (A) NONE SEEN Final   Sperm NONE SEEN  Final  Urine culture     Status: None   Collection Time: 05/24/17  6:06 PM  Result Value Ref Range Status   Specimen Description URINE, RANDOM  Final   Special Requests NONE  Final   Culture NO GROWTH  Final   Report Status 05/26/2017 FINAL  Final  Culture, blood (routine x 2)     Status: Abnormal   Collection Time: 05/24/17 11:05 PM  Result Value Ref Range Status   Specimen Description BLOOD RIGHT ANTECUBITAL  Final   Special Requests   Final    BOTTLES DRAWN AEROBIC AND ANAEROBIC Blood Culture adequate volume   Culture  Setup Time   Final    GRAM POSITIVE COCCI IN CHAINS IN BOTH AEROBIC AND ANAEROBIC BOTTLES CRITICAL VALUE NOTED.  VALUE IS CONSISTENT WITH PREVIOUSLY REPORTED AND CALLED VALUE.    Culture (A)  Final    VIRIDANS STREPTOCOCCUS SUSCEPTIBILITIES PERFORMED ON PREVIOUS CULTURE WITHIN THE LAST 5 DAYS.    Report Status 05/27/2017  FINAL  Final  Culture, blood (routine x 2)     Status: Abnormal   Collection Time: 05/24/17 11:14 PM  Result Value Ref Range Status   Specimen Description BLOOD LEFT WRIST  Final   Special Requests   Final    BOTTLES DRAWN AEROBIC AND ANAEROBIC Blood Culture adequate volume   Culture  Setup Time   Final    GRAM  POSITIVE COCCI IN CHAINS IN BOTH AEROBIC AND ANAEROBIC BOTTLES CRITICAL RESULT CALLED TO, READ BACK BY AND VERIFIED WITH: JNARKLE,PHARMD @2115  05/25/17 BY LHOWARD    Culture VIRIDANS STREPTOCOCCUS (A)  Final   Report Status 05/27/2017 FINAL  Final   Organism ID, Bacteria VIRIDANS STREPTOCOCCUS  Final      Susceptibility   Viridans streptococcus - MIC*    PENICILLIN 1 INTERMEDIATE Intermediate     CEFTRIAXONE 1 SENSITIVE Sensitive     ERYTHROMYCIN <=0.12 SENSITIVE Sensitive     LEVOFLOXACIN 1 SENSITIVE Sensitive     VANCOMYCIN 0.5 SENSITIVE Sensitive     * VIRIDANS STREPTOCOCCUS  Blood Culture ID Panel (Reflexed)     Status: Abnormal   Collection Time: 05/24/17 11:14 PM  Result Value Ref Range Status   Enterococcus species NOT DETECTED NOT DETECTED Final   Listeria monocytogenes NOT DETECTED NOT DETECTED Final   Staphylococcus species NOT DETECTED NOT DETECTED Final   Staphylococcus aureus NOT DETECTED NOT DETECTED Final   Streptococcus species DETECTED (A) NOT DETECTED Final    Comment: Not Enterococcus species, Streptococcus agalactiae, Streptococcus pyogenes, or Streptococcus pneumoniae. CRITICAL RESULT CALLED TO, READ BACK BY AND VERIFIED WITH: JNARKLE,PHARMD @2115  05/25/17 BY LHOWARD    Streptococcus agalactiae NOT DETECTED NOT DETECTED Final   Streptococcus pneumoniae NOT DETECTED NOT DETECTED Final   Streptococcus pyogenes NOT DETECTED NOT DETECTED Final   Acinetobacter baumannii NOT DETECTED NOT DETECTED Final   Enterobacteriaceae species NOT DETECTED NOT DETECTED Final   Enterobacter cloacae complex NOT DETECTED NOT DETECTED Final   Escherichia coli NOT  DETECTED NOT DETECTED Final   Klebsiella oxytoca NOT DETECTED NOT DETECTED Final   Klebsiella pneumoniae NOT DETECTED NOT DETECTED Final   Proteus species NOT DETECTED NOT DETECTED Final   Serratia marcescens NOT DETECTED NOT DETECTED Final   Haemophilus influenzae NOT DETECTED NOT DETECTED Final   Neisseria meningitidis NOT DETECTED NOT DETECTED Final   Pseudomonas aeruginosa NOT DETECTED NOT DETECTED Final   Candida albicans NOT DETECTED NOT DETECTED Final   Candida glabrata NOT DETECTED NOT DETECTED Final   Candida krusei NOT DETECTED NOT DETECTED Final   Candida parapsilosis NOT DETECTED NOT DETECTED Final   Candida tropicalis NOT DETECTED NOT DETECTED Final  Culture, blood (Routine X 2) w Reflex to ID Panel     Status: None (Preliminary result)   Collection Time: 05/25/17  3:25 PM  Result Value Ref Range Status   Specimen Description BLOOD BLOOD RIGHT HAND  Final   Special Requests IN PEDIATRIC BOTTLE Blood Culture adequate volume  Final   Culture NO GROWTH 3 DAYS  Final   Report Status PENDING  Incomplete  Culture, blood (routine x 2)     Status: None (Preliminary result)   Collection Time: 05/26/17 12:20 AM  Result Value Ref Range Status   Specimen Description BLOOD LEFT ANTECUBITAL  Final   Special Requests   Final    BOTTLES DRAWN AEROBIC AND ANAEROBIC Blood Culture adequate volume   Culture NO GROWTH 2 DAYS  Final   Report Status PENDING  Incomplete  Culture, blood (routine x 2)     Status: None (Preliminary result)   Collection Time: 05/26/17 12:22 AM  Result Value Ref Range Status   Specimen Description BLOOD LEFT HAND  Final   Special Requests   Final    BOTTLES DRAWN AEROBIC ONLY Blood Culture adequate volume   Culture NO GROWTH 2 DAYS  Final   Report Status PENDING  Incomplete    Studies/Results: Dg  Orthopantogram  Result Date: 05/27/2017 CLINICAL DATA:  Encounter for bacteremia. EXAM: ORTHOPANTOGRAM/PANORAMIC COMPARISON:  None. FINDINGS: The patient is  missing multiple teeth. Dental amalgam is identified. There is a large carie involving the left upper fourth tooth. The crown of the right upper third tooth is missing and there is a large carie within the base of this tooth.Lucency around the root of the right upper third is suspected which may represent abscess. The crown of the right upper fourth tooth is also missing. The visualized portions of the maxillary sinuses appear clear. IMPRESSION: 1. Suspect periapical abscess involving the right upper third tooth. 2. Poor dentition with loss of multiple teeth as well as bilateral upper tooth dental caries. Electronically Signed   By: Kerby Moors M.D.   On: 05/27/2017 14:36      Assessment/Plan: INTERVAL HISTORY: Patient seen by Dr. Milus Mallick since I saw her this am  Principal Problem:   Sepsis ([place]) Active Problems:   Bacteremia due to Streptococcus   Endocarditis determined by echocardiography   Diastolic murmur   Bacterial vaginosis   Atypical chest pain   Temporal headache   Streptococcal sepsis ([facility])    Krystal Shaffer is a 41 y.o. female with  Viridans Streptococcal Endocarditis:  #1 Viridans Streptococcal endocarditis with MIC to PCN of 1  I will continue Ceftriaxone and add gentamicin given high dose  She will need 4 weeks of Ceftriaxone and at least 2 weeks of gentamicin and I would try to give her full course post valve replacement  She is having teeth removed that were likely source of bacteremia  #2 Atypical chest pain: defer to primary team.  I spent greater than 35  minutes with the patient including greater than 50% of time in face to face counsel of the patient re the nature of native valve endocarditis and in coordination of her  care.    LOS: 2 days   Alcide Evener 05/28/2017, 5:41 PM

## 2017-05-28 NOTE — Consult Note (Signed)
DENTAL CONSULTATION  Date of Consultation:  05/28/2017 Patient Name:   Krystal Shaffer Date of Birth:   01-23-1976 Medical Record Number: [id number]  VITALS: BP (!) 159/69 (BP Location: Left Arm)   Pulse 95   Temp 98 F (36.7 C) (Oral)   Resp 18   Ht 5\' 4"  (1.626 m) Comment: Simultaneous filing. User may not have seen previous data.  Wt 150 lb (68 kg) Comment: Simultaneous filing. User may not have seen previous data.  SpO2 97%   BMI 25.75 kg/m   CHIEF COMPLAINT: Patient referred by Dr. Tharon Aquas Trigt for dental consultation.  HPI: Krystal Shaffer is a 41 year old female recently diagnosed with aortic valve endocarditis.  Patient with anticipated aortic valve replacement in the near future.  Patient is now seen as part of a pre-heart valve surgery dental protocol to rule out dental infection that may affect the patient systemic health and anticipated heart valve surgery.    The patient currently denies acute toothaches, swellings, or abscesses.  Patient was last seen approximately 1 year ago by Dr. Jerlyn Ly to have a tooth pulled.  Patient denies any complications from that dental extraction.  Patient denies having any partial dentures.  Patient does have some dental phobia by report.  PROBLEM LIST: Patient Active Problem List   Diagnosis Date Noted  . Bacteremia due to Streptococcus 05/26/2017  . Aortic valve endocarditis 05/26/2017  . Diastolic murmur 16/12/3708  . Bacterial vaginosis 05/26/2017  . Atypical chest pain 05/26/2017  . Temporal headache 05/26/2017  . Streptococcal sepsis ([place]) 05/26/2017  . Sepsis ([place]) 05/24/2017  . Acute pyelonephritis 05/18/2017  . Back pain 05/18/2017  . Nausea and vomiting 05/18/2017  . UTI (urinary tract infection) 05/18/2017    PMH: Past Medical History:  Diagnosis Date  . Cystitis 05/2017  . Family history of adverse reaction to anesthesia    " mother has a hard time waking "  . Migraine   . Migraines      PSH: Past Surgical History:  Procedure Laterality Date  . WISDOM TOOTH EXTRACTION      ALLERGIES: No Known Allergies  MEDICATIONS: Current Facility-Administered Medications  Medication Dose Route Frequency Provider Last Rate Last Dose  . acetaminophen (TYLENOL) tablet 650 mg  650 mg Oral Q6H PRN Bennie Pierini, MD       Or  . acetaminophen (TYLENOL) suppository 650 mg  650 mg Rectal Q6H PRN Bennie Pierini, MD      . cefTRIAXone (ROCEPHIN) 2 g in dextrose 5 % 50 mL IVPB  2 g Intravenous Q24H Michel Bickers, MD   Stopped at 05/27/17 2322  . enoxaparin (LOVENOX) injection 40 mg  40 mg Subcutaneous Q24H Bennie Pierini, MD   40 mg at 05/27/17 1111  . fluconazole (DIFLUCAN) tablet 100 mg  100 mg Oral Daily Esterwood, Amy S, PA-C   100 mg at 05/28/17 1011  . gi cocktail (Maalox,Lidocaine,Donnatal)  30 mL Oral TID PRN Mariel Aloe, MD   30 mL at 05/28/17 0342  . ketorolac (TORADOL) 30 MG/ML injection 30 mg  30 mg Intravenous Q6H PRN Mariel Aloe, MD   30 mg at 05/27/17 2354  . morphine 4 MG/ML injection 2 mg  2 mg Intravenous Q4H PRN Mariel Aloe, MD   2 mg at 05/28/17 0341  . nitroGLYCERIN (NITROSTAT) SL tablet 0.4 mg  0.4 mg Sublingual Q5 min PRN Mariel Aloe, MD   0.4 mg at 05/28/17 0341  . ondansetron (ZOFRAN) tablet  4 mg  4 mg Oral Q6H PRN Bennie Pierini, MD       Or  . ondansetron [facility]) injection 4 mg  4 mg Intravenous Q6H PRN Bennie Pierini, MD   4 mg at 05/25/17 1555  . pantoprazole (PROTONIX) EC tablet 40 mg  40 mg Oral BID AC Esterwood, Amy S, PA-C   40 mg at 05/28/17 0816  . polyethylene glycol (MIRALAX / GLYCOLAX) packet 17 g  17 g Oral Daily PRN Bennie Pierini, MD      . sucralfate (CARAFATE) 1 GM/10ML suspension 1 g  1 g Oral TID PC & HS Esterwood, Amy S, PA-C   Stopped at 05/28/17 1252    LABS: Lab Results  Component Value Date   WBC 6.8 05/25/2017   HGB 8.8 (L) 05/25/2017   HCT 28.7 (L) 05/25/2017   MCV 82.7 05/25/2017   PLT  254 05/25/2017      Component Value Date/Time   NA 134 (L) 05/25/2017 0536   K 3.7 05/25/2017 0536   CL 105 05/25/2017 0536   CO2 24 05/25/2017 0536   GLUCOSE 91 05/25/2017 0536   BUN 7 05/25/2017 0536   CREATININE 0.60 05/25/2017 0536   CALCIUM 7.8 (L) 05/25/2017 0536   GFRNONAA >60 05/25/2017 0536   GFRAA >60 05/25/2017 0536   No results found for: INR, PROTIME No results found for: PTT  SOCIAL HISTORY: Social History   Socioeconomic History  . Marital status: Legally Separated    Spouse name: Not on file  . Number of children: Not on file  . Years of education: Not on file  . Highest education level: Not on file  Social Needs  . Financial resource strain: Not on file  . Food insecurity - worry: Not on file  . Food insecurity - inability: Not on file  . Transportation needs - medical: Not on file  . Transportation needs - non-medical: Not on file  Occupational History  . Not on file  Tobacco Use  . Smoking status: Never Smoker  . Smokeless tobacco: Never Used  Substance and Sexual Activity  . Alcohol use: No  . Drug use: No  . Sexual activity: Yes    Birth control/protection: None  Other Topics Concern  . Not on file  Social History Narrative  . Not on file    FAMILY HISTORY: Family History  Problem Relation Age of Onset  . Obesity Mother   . Hypertension Sister     REVIEW OF SYSTEMS: Reviewed with the patient as per History of present illness. Psych: Patient does have some dental phobia by report.    DENTAL HISTORY: CHIEF COMPLAINT: Patient referred by Dr. Tharon Aquas Trigt for dental consultation.  HPI: Krystal Shaffer is a 41 year old female recently diagnosed with aortic valve endocarditis.  Patient with anticipated aortic valve replacement in the near future.  Patient is now seen as part of a pre-heart valve surgery dental protocol to rule out dental infection that may affect the patient systemic health and anticipated heart valve surgery.     The patient currently denies acute toothaches, swellings, or abscesses.  Patient was last seen approximately 1 year ago by Dr. Jerlyn Ly to have a tooth pulled.  Patient denies any complications from that dental extraction.  Patient denies having any partial dentures.  Patient does have some dental phobia by report.  DENTAL EXAMINATION: GENERAL: The patient is a well-developed, slightly built female in no acute distress. HEAD AND NECK: There is no  palpable neck lymphadenopathy.  The patient denies acute TMJ symptoms. INTRAORAL EXAM: The patient has normal saliva.  There is no evidence of oral abscess formation. DENTITION: The patient has multiple missing teeth.  Patient has retained root segments in the area of tooth numbers 4, 5, and 6.  The patient is lower anterior crowding involving the mandibular anterior teeth. PERIODONTAL: Patient has chronic periodontitis with plaque and calculus accumulations, gingival recession, but no significant tooth mobility. DENTAL CARIES/SUBOPTIMAL RESTORATIONS: Patient has extensive caries associated with tooth numbers 4, 5, 6, 12, and 15. ENDODONTIC: Patient currently denies acute pulpitis symptoms.  Patient does appear to have periapical pathology associated with tooth numbers 4, 5, and 6.  Caries on tooth numbers 12 and 15 appear to be impinging on the pulp.  Tooth #4 does have a previous root canal therapy that is exposed to the oral environment [place]: There are no crown or bridge restorations. PROSTHODONTIC: Patient denies having partial dentures. OCCLUSION: Patient is a poor occlusal scheme secondary to multiple missing teeth, multiple retained root segments, and lack replaced in the missing teeth with dental prostheses.  RADIOGRAPHIC INTERPRETATION: The orthopantogram was taken on 05/27/2017. There are multiple missing teeth.  There are retained root segments in the area of tooth numbers 4, 5, and 6.  There are multiple dental caries  noted.  There is incipient a moderate bone loss noted.  Multiple dental restorations are noted.  There is supraeruption drifting of the unopposed teeth into the edentulous areas.   ASSESSMENTS: 1.  Aortic valve endocarditis 2.  Pre-heart valve surgery dental protocol 3.  Chronic apical periodontitis 4.  Multiple retained root segments 5.  Dental caries 6.  Chronic periodontitis of bone loss 7.  Accretions 8.  Gingival recession 9.  Multiple missing teeth 10.  No history of partial dentures 11.  Lower anterior crowding 12.  Poor occlusal scheme and malocclusion 13.  Dental phobia  PLAN/RECOMMENDATIONS: 1. I discussed the risks, benefits, and complications of various treatment options with the patient in relationship to her medical and dental conditions, current endocarditis, and anticipated heart valve surgery. We discussed various treatment options to include no treatment, multiple extractions with alveoloplasty, pre-prosthetic surgery as indicated, periodontal therapy, dental restorations, root canal therapy, crown and bridge therapy, implant therapy, and replacement of missing teeth as indicated. The patient currently wishes to proceed with extraction of tooth numbers 4, 5, 6, 12, and 15 with alveoloplasty as needed along with gross debridement of remaining dentition in the operating room with general anesthesia.  The patient will then proceed with  heart valve surgery at the discretion of Dr. Tharon Aquas Trigt upon adequate healing from the dental extractions.  The patient will then follow-up with her general dentist of her choice for additional dental care as indicated once medically stable from the anticipated heart valve surgery.  The patient will require antibiotic premedication prior to invasive dental procedures after her heart valve surgery per American Heart Association guidelines.   2. Discussion of findings with medical team and coordination of future medical and dental care as  needed.    Lenn Cal, DDS

## 2017-05-28 NOTE — Consult Note (Signed)
Cardiology Consultation:   Patient ID: Krystal Shaffer; [id number]; 25-Dec-1975   Admit date: 05/24/2017 Date of Consult: 05/28/2017  Primary Care Provider: Sofie Rower, PA-C Primary Cardiologist: NA Primary Electrophysiologist: NA  Patient Profile:   Krystal Shaffer is a 41 y.o. female with a hx of migraines and recurrent UTI who is being seen today for the evaluation for cardiac clearance for aortic valve surgery s/p endocarditis at the request of Dr. Prescott Gum.    History of Present Illness:   Krystal Shaffer is a 41 yo F who initially presented to the ED 05/17/17 for failed oral antibiotic therapy for E.Coli positive UTI. She was subsequently admitted and treated with IV antibiotics and discharged on Cipro and Flagyl.   She was then re-admitted on 05/25/17 with a continuing fever, chills, malaise, and weight loss with evidence of bacteremia, now cleared with IV antibiotics as of 05/26/17. At that time, she began having some associated chest pain with her symptoms. Due to her recent infections and chest pain, an Echocardiogram was ordered on 05/25/17 which revealed an EF of 60% and aortic valve vegetation consistent with endocarditis. ID and CVTS were consulted.   Her admission blood cultures have grown viridans strep, with repeat cultures negative thus far. She is felt to have subacute streptococcal aortic valve endocarditis per ID.   CVTS recommends aortic valve surgery with inpatient cardiac and dental clearance including dental extraction before surgery scheduling.   [facility] xrays were completed due to dental disease which showed tooth abscesses. Dr. Enrique Sack consulted per IM.   A TEE has been set up for tomorrow (05/29/17).   Cardiology to schedule patient for coronary angiogram/coronary CT for tomorrow after TEE.   She denies a history of rheumatic heart disease or history of aortic valve or CAD disease in her family. She denies cardiac problems with her pregnancies.     Past Medical History:  Diagnosis Date  . Cystitis 05/2017  . Family history of adverse reaction to anesthesia    " mother has a hard time waking "  . Migraine   . Migraines     Past Surgical History:  Procedure Laterality Date  . WISDOM TOOTH EXTRACTION       Prior to Admission medications   Medication Sig Start Date End Date Taking? Authorizing Provider  aspirin-acetaminophen-caffeine (EXCEDRIN MIGRAINE) [phone] MG tablet Take 1-2 tablets by mouth every 6 (six) hours as needed for headache.   Yes [provider]  ondansetron (ZOFRAN ODT) 4 MG disintegrating tablet Take 1 tablet (4 mg total) by mouth every 8 (eight) hours as needed for nausea or vomiting. 05/08/17  Yes Duffy Bruce, MD    Inpatient Medications: Scheduled Meds: . enoxaparin (LOVENOX) injection  40 mg Subcutaneous Q24H  . fluconazole  100 mg Oral Daily  . pantoprazole  40 mg Oral BID AC  . sucralfate  1 g Oral TID PC & HS   Continuous Infusions: . cefTRIAXone (ROCEPHIN)  IV Stopped (05/27/17 2322)   PRN Meds: acetaminophen **OR** acetaminophen, gi cocktail, ketorolac, morphine injection, nitroGLYCERIN, ondansetron **OR** ondansetron (ZOFRAN) IV, polyethylene glycol  Allergies:   No Known Allergies  Social History:   Social History   Socioeconomic History  . Marital status: Legally Separated    Spouse name: Not on file  . Number of children: Not on file  . Years of education: Not on file  . Highest education level: Not on file  Social Needs  . Financial resource strain: Not on file  . Food  insecurity - worry: Not on file  . Food insecurity - inability: Not on file  . Transportation needs - medical: Not on file  . Transportation needs - non-medical: Not on file  Occupational History  . Not on file  Tobacco Use  . Smoking status: Never Smoker  . Smokeless tobacco: Never Used  Substance and Sexual Activity  . Alcohol use: No  . Drug use: No  . Sexual activity: Yes    Birth  control/protection: None  Other Topics Concern  . Not on file  Social History Narrative  . Not on file    Family History:   Family History  Problem Relation Age of Onset  . Obesity Mother   . Hypertension Sister    Family Status:  Family Status  Relation Name Status  . Mother  Alive  . Sister  Alive  . Brother  Alive    ROS:  Please see the history of present illness.  All other ROS reviewed and negative.     Physical Exam/Data:   Vitals:   05/27/17 0447 05/27/17 1417 05/27/17 2125 05/28/17 0455  BP: (!) 160/61 139/67 (!) 155/61 (!) 131/53  Pulse: (!) 104 96 91 89  Resp: 18 18 19 17   Temp: 98.3 F (36.8 C) 98.3 F (36.8 C) 98.5 F (36.9 C) 97.8 F (36.6 C)  TempSrc: Oral Oral Oral   SpO2: 98% 96% 98% 97%  Weight:      Height:        Intake/Output Summary (Last 24 hours) at 05/28/2017 1206 Last data filed at 05/28/2017 1137 Gross per 24 hour  Intake 530 ml  Output -  Net 530 ml   Filed Weights   05/24/17 1405  Weight: 150 lb (68 kg)   Body mass index is 25.75 kg/m.   General: Well developed, well nourished, NAD Skin: Warm, dry, intact  Head: Normocephalic, atraumatic, clear, moist mucus membranes. Neck: Negative for carotid bruits. No JVD Lungs:Clear to ausculation bilaterally. No wheezes, rales, or rhonchi. Breathing is unlabored. Cardiovascular: RRR with S1 S2. 3/6 diastolic murmur. No rubs, gallops, or LV heave appreciated. Abdomen: Soft, non-tender, non-distended with normoactive bowel sounds. No obvious abdominal masses. MSK: Strength and tone appear normal for age. 5/5 in all extremities Extremities: No edema. No clubbing or cyanosis. DP/PT pulses 2+ bilaterally Neuro: Alert and oriented. No focal deficits. No facial asymmetry. MAE spontaneously. Psych: Responds to questions appropriately with normal affect.    EKG:  The EKG was personally reviewed and demonstrates: 05/26/2017- NSR 97 Telemetry:  Telemetry was personally reviewed and  demonstrates: 05/28/2017- NSR  Relevant CV Studies:  ECHO: 05/25/2017 Study Conclusions  - Left ventricle: The cavity size was normal. Systolic function was   normal. The estimated ejection fraction was in the range of 60%   to 65%. Wall motion was normal; there were no regional wall   motion abnormalities. Doppler parameters are consistent with   abnormal left ventricular relaxation (grade 1 diastolic   dysfunction). - Aortic valve: Trileaflet; moderately thickened leaflets with 1cm   x 1cm mass on the right coronary cusp, mild mobility, suggestive   of vegetation. If no signs of infection, consider fibroelastoma.   There was severe stenosis. Valve area (VTI): 1.32 cm^2. Valve   area (Vmax): 1.41 cm^2. Valve area (Vmean): 1.28 cm^2.  ------------------------------------------------------------------- Study data:  No prior study was available for comparison.  Study status:  Routine.  Procedure:  The patient reported no pain pre or post test. Transthoracic echocardiography. Image quality  was adequate.  Study completion:  There were no complications. Transthoracic echocardiography.  M-mode, complete 2D, spectral Doppler, and color Doppler.  Birthdate:  Patient birthdate: January 28, 1976.  Age:  Patient is 41 yr old.  Sex:  Gender: female. BMI: 25 kg/m^2.  Blood pressure:     120/54  Patient status: Inpatient.  Study date:  Study date: 05/25/2017. Study time: 09:48 AM.  Location:  Bedside.  -------------------------------------------------------------------  ------------------------------------------------------------------- Left ventricle:  The cavity size was normal. Systolic function was normal. The estimated ejection fraction was in the range of 60% to 65%. Wall motion was normal; there were no regional wall motion abnormalities. Doppler parameters are consistent with abnormal left ventricular relaxation (grade 1 diastolic  dysfunction).  ------------------------------------------------------------------- Aortic valve:  Trileaflet; moderately thickened leaflets with 1cm x 1cm mass on the right coronary cusp, mild mobility, suggestive of vegetation. If no signs of infection, consider fibroelastoma. Mobility was not restricted.  Doppler:   There was severe stenosis.   There was no regurgitation.    VTI ratio of LVOT to aortic valve: 0.42. Valve area (VTI): 1.32 cm^2. Indexed valve area (VTI): 0.74 cm^2/m^2. Peak velocity ratio of LVOT to aortic valve: 0.45. Valve area (Vmax): 1.41 cm^2. Indexed valve area (Vmax): 0.79 cm^2/m^2. Mean velocity ratio of LVOT to aortic valve: 0.41. Valve area (Vmean): 1.28 cm^2. Indexed valve area (Vmean): 0.72 cm^2/m^2. Mean gradient (S): 15 mm Hg. Peak gradient (S): 24 mm Hg.  ------------------------------------------------------------------- Aorta:  Aortic root: The aortic root was normal in size.  ------------------------------------------------------------------- Mitral valve:   Structurally normal valve.   Mobility was not restricted.  Doppler:  Transvalvular velocity was within the normal range. There was no evidence for stenosis. There was no regurgitation.    Peak gradient (D): 8 mm Hg.  ------------------------------------------------------------------- Left atrium:  The atrium was normal in size.  ------------------------------------------------------------------- Right ventricle:  The cavity size was normal. Wall thickness was normal. Systolic function was normal.  ------------------------------------------------------------------- Pulmonic valve:   Poorly visualized.  Structurally normal valve. Cusp separation was normal.  Doppler:  Transvalvular velocity was within the normal range. There was no evidence for stenosis. There was no regurgitation.  ------------------------------------------------------------------- Tricuspid valve:   Structurally  normal valve.    Doppler: Transvalvular velocity was within the normal range. There was no regurgitation.  ------------------------------------------------------------------- Pulmonary artery:   The main pulmonary artery was normal-sized. Systolic pressure was within the normal range.  ------------------------------------------------------------------- Right atrium:  The atrium was normal in size.  ------------------------------------------------------------------- Pericardium:  There was no pericardial effusion.  ------------------------------------------------------------------- Systemic veins: Inferior vena cava: The vessel was normal in size.  ------------------------------------------------------------------- Measurements   Left ventricle                           Value          Reference  LV ID, ED, PLAX chordal          (H)     55.2  mm       43 - 52  LV ID, ES, PLAX chordal          (H)     38.7  mm       23 - 38  LV fx shortening, PLAX chordal           30    %        >=29  LV PW thickness, ED  11.2  mm       ----------  IVS/LV PW ratio, ED                      1.04           <=1.3  Stroke volume, 2D                        71    ml       ----------  Stroke volume/bsa, 2D                    40    ml/m^2   ----------  LV e&', lateral                           9.03  cm/s     ----------  LV E/e&', lateral                         15.39          ----------  LV e&', medial                            8.38  cm/s     ----------  LV E/e&', medial                          16.59          ----------  LV e&', average                           8.71  cm/s     ----------  LV E/e&', average                         15.97          ----------    Ventricular septum                       Value          Reference  IVS thickness, ED                        11.6  mm       ----------    LVOT                                     Value          Reference  LVOT ID, S                                20    mm       ----------  LVOT area                                3.14  cm^2     ----------  LVOT peak velocity, S                    111   cm/s     ----------  LVOT mean velocity, S                    73.8  cm/s     ----------  LVOT VTI, S                              22.6  cm       ----------    Aortic valve                             Value          Reference  Aortic valve peak velocity, S            247   cm/s     ----------  Aortic valve mean velocity, S            181   cm/s     ----------  Aortic valve VTI, S                      53.7  cm       ----------  Aortic mean gradient, S                  15    mm Hg    ----------  Aortic peak gradient, S                  24    mm Hg    ----------  VTI ratio, LVOT/AV                       0.42           ----------  Aortic valve area, VTI                   1.32  cm^2     ----------  Aortic valve area/bsa, VTI               0.74  cm^2/m^2 ----------  Velocity ratio, peak, LVOT/AV            0.45           ----------  Aortic valve area, peak velocity         1.41  cm^2     ----------  Aortic valve area/bsa, peak              0.79  cm^2/m^2 ----------  velocity  Velocity ratio, mean, LVOT/AV            0.41           ----------  Aortic valve area, mean velocity         1.28  cm^2     ----------  Aortic valve area/bsa, mean              0.72  cm^2/m^2 ----------  velocity    Aorta                                    Value          Reference  Aortic root ID, ED                       30    mm       ----------  Left atrium                              Value          Reference  LA ID, A-P, ES                           34    mm       ----------  LA ID/bsa, A-P                           1.91  cm/m^2   <=2.2  LA volume, S                             48    ml       ----------  LA volume/bsa, S                         27    ml/m^2   ----------  LA volume, ES, 1-p A4C                   41.7  ml       ----------  LA  volume/bsa, ES, 1-p A4C               23.5  ml/m^2   ----------  LA volume, ES, 1-p A2C                   50.2  ml       ----------  LA volume/bsa, ES, 1-p A2C               28.2  ml/m^2   ----------    Mitral valve                             Value          Reference  Mitral E-wave peak velocity              139   cm/s     ----------  Mitral deceleration time                 158   ms       150 - 230  Mitral peak gradient, D                  8     mm Hg    ----------    Right atrium                             Value          Reference  RA ID, S-I, ES, A4C                      48.5  mm       34 - 49  RA area, ES, A4C                         12    cm^2     8.3 - 19.5  RA volume, ES, A/L  24.2  ml       ----------  RA volume/bsa, ES, A/L                   13.6  ml/m^2   ----------    Right ventricle                          Value          Reference  RV ID, minor axis, ED, A4C base          41.1  mm       ----------  RV ID, minor axis, ED, A4C mid           31.7  mm       ----------  RV ID, major axis, ED, A4C               79.1  mm       55 - 91  TAPSE                                    22.8  mm       ----------  RV s&', lateral, S                        14.6  cm/s     ----------  Legend: (L)  and  (H)  mark values outside specified reference range.   CATH: NA  Laboratory Data:  Chemistry Recent Labs  Lab 05/24/17 1433 05/25/17 0536  NA 133* 134*  K 3.5 3.7  CL 103 105  CO2 22 24  GLUCOSE 97 91  BUN 7 7  CREATININE 0.63 0.60  CALCIUM 8.7* 7.8*  GFRNONAA >60 >60  GFRAA >60 >60  ANIONGAP 8 5    Total Protein  Date Value Ref Range Status  05/24/2017 7.4 6.5 - 8.1 g/dL Final   Albumin  Date Value Ref Range Status  05/24/2017 2.8 (L) 3.5 - 5.0 g/dL Final   AST  Date Value Ref Range Status  05/24/2017 15 15 - 41 U/L Final   ALT  Date Value Ref Range Status  05/24/2017 10 (L) 14 - 54 U/L Final   Alkaline Phosphatase  Date Value Ref Range  Status  05/24/2017 64 38 - 126 U/L Final   Total Bilirubin  Date Value Ref Range Status  05/24/2017 0.5 0.3 - 1.2 mg/dL Final   Hematology Recent Labs  Lab 05/24/17 1433 05/25/17 0034 05/25/17 0536  WBC 12.3*  --  6.8  RBC 3.93 3.39* 3.47*  HGB 10.1*  --  8.8*  HCT 32.2*  --  28.7*  MCV 81.9  --  82.7  MCH 25.7*  --  25.4*  MCHC 31.4  --  30.7  RDW 15.1  --  15.2  PLT 298  --  254   Cardiac EnzymesNo results for input(s): TROPONINI in the last 168 hours.  Recent Labs  Lab 05/24/17 1450 05/24/17 2258  TROPIPOC 0.00 0.00    BNPNo results for input(s): BNP, PROBNP in the last 168 hours.  DDimer  Recent Labs  Lab 05/24/17 1731  DDIMER 0.51*   TSH:  Lab Results  Component Value Date   TSH 3.337 05/25/2017   Lipids:No results found for: CHOL, HDL, LDLCALC, LDLDIRECT, TRIG, CHOLHDL HgbA1c:No results found for: HGBA1C  Radiology/Studies:  Dg Orthopantogram  Result  Date: 05/27/2017 CLINICAL DATA:  Encounter for bacteremia. EXAM: ORTHOPANTOGRAM/PANORAMIC COMPARISON:  None. FINDINGS: The patient is missing multiple teeth. Dental amalgam is identified. There is a large carie involving the left upper fourth tooth. The crown of the right upper third tooth is missing and there is a large carie within the base of this tooth.Lucency around the root of the right upper third is suspected which may represent abscess. The crown of the right upper fourth tooth is also missing. The visualized portions of the maxillary sinuses appear clear. IMPRESSION: 1. Suspect periapical abscess involving the right upper third tooth. 2. Poor dentition with loss of multiple teeth as well as bilateral upper tooth dental caries. Electronically Signed   By: Kerby Moors M.D.   On: 05/27/2017 14:36   Dg Chest 2 View  Result Date: 05/24/2017 CLINICAL DATA:  Chest pain EXAM: CHEST  2 VIEW COMPARISON:  May 08, 2017 FINDINGS: Heart is mildly enlarged with pulmonary vascularity within normal limits. No  edema or consolidation. No adenopathy. No evident bone lesions. IMPRESSION: Mild cardiac enlargement.  No edema or consolidation. Electronically Signed   By: Lowella Grip III M.D.   On: 05/24/2017 14:38   Ct Angio Chest Pe W And/or Wo Contrast  Result Date: 05/24/2017 CLINICAL DATA:  Right-sided chest pain radiating to the back for 2 days. Dyspnea. EXAM: CT ANGIOGRAPHY CHEST WITH CONTRAST TECHNIQUE: Multidetector CT imaging of the chest was performed using the standard protocol during bolus administration of intravenous contrast. Multiplanar CT image reconstructions and MIPs were obtained to evaluate the vascular anatomy. CONTRAST:  181mL ISOVUE-370 IOPAMIDOL (ISOVUE-370) INJECTION 76% COMPARISON:  Radiographs 05/24/2017 at 14:31 FINDINGS: Cardiovascular: Satisfactory opacification of the pulmonary arteries to the segmental level. No evidence of pulmonary embolism. Normal heart size. No pericardial effusion. The thoracic aorta is normal in caliber and intact. Mediastinum/Nodes: No enlarged mediastinal, hilar, or axillary lymph nodes. Thyroid gland, trachea, and esophagus demonstrate no significant findings. Lungs/Pleura: Lungs are clear. No pleural effusion or pneumothorax. Upper Abdomen: No acute abnormality. Musculoskeletal: No chest wall abnormality. No acute or significant osseous findings. Review of the MIP images confirms the above findings. IMPRESSION: Negative for pulmonary embolism.  No significant abnormality. Electronically Signed   By: Andreas Newport M.D.   On: 05/24/2017 20:32   US Renal  Result Date: 05/25/2017 CLINICAL DATA:  Pyelonephritis. EXAM: RENAL / URINARY TRACT ULTRASOUND COMPLETE COMPARISON:  Ultrasound 05/18/2017.  CT 05/08/2017. FINDINGS: Right Kidney: Length: 13.0 cm. Echogenicity within normal limits. No mass or hydronephrosis visualized. Left Kidney: Length: 13.4 cm. Echogenicity within normal limits. No mass or hydronephrosis visualized. Bladder: Appears normal for  degree of bladder distention. 4.6 cm cyst left lower pelvis, most likely left ovarian. This is increased slightly in size from prior exam. Two internal echoes are noted on today's exam. This may represent a hemorrhagic cyst. IMPRESSION: 1. No acute renal abnormality. No hydronephrosis or bladder distention. 2. 4.6 cm cyst left lower pelvis. This is most likely ovarian in origin. This has increased slightly in size from prior exam. Faint internal echoes are noted within the cyst on today's exam. This this cyst may be a hemorrhagic cyst. Pregnancy test to exclude ectopic pregnancy suggested. Short-interval follow up ultrasound in 6-12 weeks is recommended, preferably during the week following the patient's normal menses. Electronically Signed   By: Marcello Moores  Register   On: 05/25/2017 12:25    Assessment and Plan:   1. Endocarditis/pre-surgical clearance: -Echo 05/25/2017 revealed aortic insufficiency with a large vegetation on the  right coronary leaflet of the aortic valve with severe aortic insufficiency and adequate LV function. EF 60-65% -Pt schedule for TEE-Echo tomorrow (05/29/17) as part of pre-surgical clearance and evaluation -Blood cultures clear now  -Dental abscess confirmed per [facility] xray. Will need tooth extraction prior to AVR.  -Will schedule cardiac CT for tomorrow to evaluate for coronary artery disease  -Will start lopressor 25 mg PO BID to slow her rate down for scan   Principal Problem:   Sepsis ([place]) Active Problems:   Bacteremia due to Streptococcus   Aortic valve endocarditis   Diastolic murmur   Bacterial vaginosis   Atypical chest pain   Temporal headache   Streptococcal sepsis ([place])   For questions or updates, please contact [facility] Please consult [email or web address] for contact info under Cardiology/STEMI.   Signed, Kathyrn Drown, NP  05/28/2017 12:06 PM Patient seen and examined and history reviewed. Agree with above findings and plan. 41 yo BF admitted with  streptococcal AV endocarditis. No prior cardiac history. No history of HTN, DM, HLD. Has smoked periodically in the past. No chest pain  On exam she is hypertensive. HR 90s NSR No JVD.  Lungs are clear. CV RRR with gr 2/6 SEM RUSB and gr 3/6 diastolic murmur RUSB>> LSB.  Ecg shows NSR with poor R wave progression. I have personally reviewed and interpreted this study.   Echo shows severe AI with 1 cm valve vegetation. I have personally reviewed and interpreted this study.  Impression: streptococcal AV endocarditis with severe AI. Needs coronary clearance prior to AV surgery. She is at low risk- only risk factor is intermittent smoking. No chest pain. I would favor noninvasive evaluation with coronary CTA. I feel this is less risky and with excellent negative predictive value. Will initiate beta blocker therapy today to try and optimize HR for scan . Will schedule for tomorrow.    Peter Martinique, IXL 05/28/2017 2:13 PM

## 2017-05-28 NOTE — Progress Notes (Signed)
PROGRESS NOTE    Lulani Bour  KYH:[id number] DOB: [birth year] DOA: 05/24/2017 PCP: Sofie Rower, PA-C   Brief Narrative: Krystal Shaffer is a 41 y.o. female with medical history significant for recent E. coli UTI with failed outpatient management requiring short inpatient stay for antibiotic therapy who presents to the ED with continued fever, chills, night sweats, myalgias. She presented with sepsis concerning for endocarditis with new murmur.   Assessment & Plan:   Principal Problem:   Sepsis ([place]) Active Problems:   Bacteremia due to Streptococcus   Aortic valve endocarditis   Diastolic murmur   Bacterial vaginosis   Atypical chest pain   Temporal headache   Streptococcal sepsis ([facility])   Sepsis Streptococcal viridans bacteremia BCID positive for strep. 2/2 cultures (12/13) positive. Ceftriaxone restarted overnight. Patient with recent dental work and broken teeth. Urine culture with no growth. Orthopantogram suspicious for abscess of right upper third tooth -Continue ceftriaxone  -Blood culture (12/13 and 12/15) -Consulted Dr. Enrique Sack (12/17)  Aortic valve vegetation New. Concern for endocarditis. -TEE -Cardiothoracic surgery planning valve replacement pending cardiac coronary workup -Consult cardiology  History of UTI Flank pain Patient with under-treated UTI secondary to non-adherence secondary to nausea and vomiting.  -Urine culture with no growth  Bacterial vaginosis -Continue metronidazole  Normocytic anemia Iron studies in line with anemia of chronic disease  Headache Chronic. -Continue morphine prn  Atypical chest pain Reflux vs dysmotility vs esophagitis. -Protonix -GI cocktail prn -Nitro prn -recheck troponin -GI consult: started empiric diflucan, carafate, increased protonix to BID   DVT prophylaxis: Lovenox Code Status: Full code Family Communication: None at bedside Disposition Plan: Discharge pending workup   Consultants:     None  Procedures:   Echocardiogram (12/14) Study Conclusions  - Left ventricle: The cavity size was normal. Systolic function was   normal. The estimated ejection fraction was in the range of 60%   to 65%. Wall motion was normal; there were no regional wall   motion abnormalities. Doppler parameters are consistent with   abnormal left ventricular relaxation (grade 1 diastolic   dysfunction). - Aortic valve: Trileaflet; moderately thickened leaflets with 1cm   x 1cm mass on the right coronary cusp, mild mobility, suggestive   of vegetation. If no signs of infection, consider fibroelastoma.   There was severe stenosis. Valve area (VTI): 1.32 cm^2. Valve   area (Vmax): 1.41 cm^2. Valve area (Vmean): 1.28 cm^2.  Antimicrobials:  Vancomycin  Ceftriaxone  Metronidazole    Subjective: Chest pain significant overnight and improved with nitro sublingual. No chest pain this morning.  Objective: Vitals:   05/27/17 0447 05/27/17 1417 05/27/17 2125 05/28/17 0455  BP: (!) 160/61 139/67 (!) 155/61 (!) 131/53  Pulse: (!) 104 96 91 89  Resp: 18 18 19 17   Temp: 98.3 F (36.8 C) 98.3 F (36.8 C) 98.5 F (36.9 C) 97.8 F (36.6 C)  TempSrc: Oral Oral Oral   SpO2: 98% 96% 98% 97%  Weight:      Height:        Intake/Output Summary (Last 24 hours) at 05/28/2017 1145 Last data filed at 05/28/2017 1137 Gross per 24 hour  Intake 530 ml  Output -  Net 530 ml   Filed Weights   05/24/17 1405  Weight: 68 kg (150 lb)    Examination:  General exam: Appears calm and comfortable Respiratory system: Respiratory effort normal Cardiovascular system: S1 & S2 heard, RRR. 3/6 systolic and 2/6 diastolic murmurs heard best in RUSB. No rubs, gallops or clicks.  Gastrointestinal system: Abdomen is nondistended, soft and nontender. No organomegaly or masses felt. Normal bowel sounds heard. Central nervous system: Alert and oriented. No focal neurological deficits. Extremities: No edema. No  calf tenderness Skin: No cyanosis. No rashes Psychiatry: Judgement and insight appear normal. Mood & affect appropriate.     Data Reviewed: I have personally reviewed following labs and imaging studies  CBC: Recent Labs  Lab 05/24/17 1433 05/25/17 0034 05/25/17 0536  WBC 12.3*  --  6.8  NEUTROABS  --  5.4  --   HGB 10.1*  --  8.8*  HCT 32.2*  --  28.7*  MCV 81.9  --  82.7  PLT 298  --  194   Basic Metabolic Panel: Recent Labs  Lab 05/24/17 1433 05/25/17 0034 05/25/17 0536  NA 133*  --  134*  K 3.5  --  3.7  CL 103  --  105  CO2 22  --  24  GLUCOSE 97  --  91  BUN 7  --  7  CREATININE 0.63  --  0.60  CALCIUM 8.7*  --  7.8*  MG  --  1.6*  --   PHOS  --  3.1  --    GFR: Estimated Creatinine Clearance: 88.5 mL/min (by C-G formula based on SCr of 0.6 mg/dL). Liver Function Tests: Recent Labs  Lab 05/24/17 1433  AST 15  ALT 10*  ALKPHOS 64  BILITOT 0.5  PROT 7.4  ALBUMIN 2.8*   No results for input(s): LIPASE, AMYLASE in the last 168 hours. No results for input(s): AMMONIA in the last 168 hours. Coagulation Profile: No results for input(s): INR, PROTIME in the last 168 hours. Cardiac Enzymes: No results for input(s): CKTOTAL, CKMB, CKMBINDEX, TROPONINI in the last 168 hours. BNP (last 3 results) No results for input(s): PROBNP in the last 8760 hours. HbA1C: No results for input(s): HGBA1C in the last 72 hours. CBG: No results for input(s): GLUCAP in the last 168 hours. Lipid Profile: No results for input(s): CHOL, HDL, LDLCALC, TRIG, CHOLHDL, LDLDIRECT in the last 72 hours. Thyroid Function Tests: No results for input(s): TSH, T4TOTAL, FREET4, T3FREE, THYROIDAB in the last 72 hours. Anemia Panel: No results for input(s): VITAMINB12, FOLATE, FERRITIN, TIBC, IRON, RETICCTPCT in the last 72 hours. Sepsis Labs: Recent Labs  Lab 05/24/17 1452 05/24/17 1811  LATICACIDVEN 1.35 1.18    Recent Results (from the past 240 hour(s))  Urine culture      Status: None   Collection Time: 05/24/17  6:06 PM  Result Value Ref Range Status   Specimen Description URINE, RANDOM  Final   Special Requests NONE  Final   Culture NO GROWTH  Final   Report Status 05/26/2017 FINAL  Final  Culture, blood (routine x 2)     Status: Abnormal   Collection Time: 05/24/17 11:05 PM  Result Value Ref Range Status   Specimen Description BLOOD RIGHT ANTECUBITAL  Final   Special Requests   Final    BOTTLES DRAWN AEROBIC AND ANAEROBIC Blood Culture adequate volume   Culture  Setup Time   Final    GRAM POSITIVE COCCI IN CHAINS IN BOTH AEROBIC AND ANAEROBIC BOTTLES CRITICAL VALUE NOTED.  VALUE IS CONSISTENT WITH PREVIOUSLY REPORTED AND CALLED VALUE.    Culture (A)  Final    VIRIDANS STREPTOCOCCUS SUSCEPTIBILITIES PERFORMED ON PREVIOUS CULTURE WITHIN THE LAST 5 DAYS.    Report Status 05/27/2017 FINAL  Final  Culture, blood (routine x 2)     Status:  Abnormal   Collection Time: 05/24/17 11:14 PM  Result Value Ref Range Status   Specimen Description BLOOD LEFT WRIST  Final   Special Requests   Final    BOTTLES DRAWN AEROBIC AND ANAEROBIC Blood Culture adequate volume   Culture  Setup Time   Final    GRAM POSITIVE COCCI IN CHAINS IN BOTH AEROBIC AND ANAEROBIC BOTTLES CRITICAL RESULT CALLED TO, READ BACK BY AND VERIFIED WITH: JNARKLE,PHARMD @2115  05/25/17 BY LHOWARD    Culture VIRIDANS STREPTOCOCCUS (A)  Final   Report Status 05/27/2017 FINAL  Final   Organism ID, Bacteria VIRIDANS STREPTOCOCCUS  Final      Susceptibility   Viridans streptococcus - MIC*    PENICILLIN 1 INTERMEDIATE Intermediate     CEFTRIAXONE 1 SENSITIVE Sensitive     ERYTHROMYCIN <=0.12 SENSITIVE Sensitive     LEVOFLOXACIN 1 SENSITIVE Sensitive     VANCOMYCIN 0.5 SENSITIVE Sensitive     * VIRIDANS STREPTOCOCCUS  Blood Culture ID Panel (Reflexed)     Status: Abnormal   Collection Time: 05/24/17 11:14 PM  Result Value Ref Range Status   Enterococcus species NOT DETECTED NOT DETECTED  Final   Listeria monocytogenes NOT DETECTED NOT DETECTED Final   Staphylococcus species NOT DETECTED NOT DETECTED Final   Staphylococcus aureus NOT DETECTED NOT DETECTED Final   Streptococcus species DETECTED (A) NOT DETECTED Final    Comment: Not Enterococcus species, Streptococcus agalactiae, Streptococcus pyogenes, or Streptococcus pneumoniae. CRITICAL RESULT CALLED TO, READ BACK BY AND VERIFIED WITH: JNARKLE,PHARMD @2115  05/25/17 BY LHOWARD    Streptococcus agalactiae NOT DETECTED NOT DETECTED Final   Streptococcus pneumoniae NOT DETECTED NOT DETECTED Final   Streptococcus pyogenes NOT DETECTED NOT DETECTED Final   Acinetobacter baumannii NOT DETECTED NOT DETECTED Final   Enterobacteriaceae species NOT DETECTED NOT DETECTED Final   Enterobacter cloacae complex NOT DETECTED NOT DETECTED Final   Escherichia coli NOT DETECTED NOT DETECTED Final   Klebsiella oxytoca NOT DETECTED NOT DETECTED Final   Klebsiella pneumoniae NOT DETECTED NOT DETECTED Final   Proteus species NOT DETECTED NOT DETECTED Final   Serratia marcescens NOT DETECTED NOT DETECTED Final   Haemophilus influenzae NOT DETECTED NOT DETECTED Final   Neisseria meningitidis NOT DETECTED NOT DETECTED Final   Pseudomonas aeruginosa NOT DETECTED NOT DETECTED Final   Candida albicans NOT DETECTED NOT DETECTED Final   Candida glabrata NOT DETECTED NOT DETECTED Final   Candida krusei NOT DETECTED NOT DETECTED Final   Candida parapsilosis NOT DETECTED NOT DETECTED Final   Candida tropicalis NOT DETECTED NOT DETECTED Final  Culture, blood (Routine X 2) w Reflex to ID Panel     Status: None (Preliminary result)   Collection Time: 05/25/17  3:25 PM  Result Value Ref Range Status   Specimen Description BLOOD BLOOD RIGHT HAND  Final   Special Requests IN PEDIATRIC BOTTLE Blood Culture adequate volume  Final   Culture NO GROWTH 2 DAYS  Final   Report Status PENDING  Incomplete  Culture, blood (routine x 2)     Status: None  (Preliminary result)   Collection Time: 05/26/17 12:20 AM  Result Value Ref Range Status   Specimen Description BLOOD LEFT ANTECUBITAL  Final   Special Requests   Final    BOTTLES DRAWN AEROBIC AND ANAEROBIC Blood Culture adequate volume   Culture NO GROWTH 1 DAY  Final   Report Status PENDING  Incomplete  Culture, blood (routine x 2)     Status: None (Preliminary result)   Collection Time: 05/26/17  12:22 AM  Result Value Ref Range Status   Specimen Description BLOOD LEFT HAND  Final   Special Requests   Final    BOTTLES DRAWN AEROBIC ONLY Blood Culture adequate volume   Culture NO GROWTH 1 DAY  Final   Report Status PENDING  Incomplete         Radiology Studies: Dg Orthopantogram  Result Date: 05/27/2017 CLINICAL DATA:  Encounter for bacteremia. EXAM: ORTHOPANTOGRAM/PANORAMIC COMPARISON:  None. FINDINGS: The patient is missing multiple teeth. Dental amalgam is identified. There is a large carie involving the left upper fourth tooth. The crown of the right upper third tooth is missing and there is a large carie within the base of this tooth.Lucency around the root of the right upper third is suspected which may represent abscess. The crown of the right upper fourth tooth is also missing. The visualized portions of the maxillary sinuses appear clear. IMPRESSION: 1. Suspect periapical abscess involving the right upper third tooth. 2. Poor dentition with loss of multiple teeth as well as bilateral upper tooth dental caries. Electronically Signed   By: Kerby Moors M.D.   On: 05/27/2017 14:36        Scheduled Meds: . enoxaparin (LOVENOX) injection  40 mg Subcutaneous Q24H  . fluconazole  100 mg Oral Daily  . pantoprazole  40 mg Oral BID AC  . sucralfate  1 g Oral TID PC & HS   Continuous Infusions: . cefTRIAXone (ROCEPHIN)  IV Stopped (05/27/17 2322)     LOS: 2 days     Cordelia Poche, MD [facility] 05/28/2017, 11:45 AM Pager: [phone]  If 7PM-7AM, please  contact night-coverage [email or web address] Password [email or web address] 05/28/2017, 11:45 AM

## 2017-05-28 NOTE — Progress Notes (Signed)
Pharmacy Antibiotic Note  Krystal Shaffer is a 41 y.o. female admitted on 05/24/2017 with viridans strep, native aortic valve endocarditis.  Pharmacy has been consulted for gentamicin synergy dosing with ceftriaxone.  Planning dental extractions and then aortic valve replacement.  Plan: 1. Ceftriaxone 2g q 24 hrs - planning x 4 weeks AFTER valve replacement 2. Gentamicin 3mg /kg q 24 hrs (= 200 mg q 24 hrs) - I think Dr. Tommy Medal wants 2 weeks AFTER valve replacement (vs. 2 weeks total?) 3. F/u cultures, renal function, and clinical course. 4. Gentamicin trough level at steady state as appropriate.  Height: 5\' 4"  (162.6 cm)(Simultaneous filing. User may not have seen previous data.) Weight: 150 lb (68 kg)(Simultaneous filing. User may not have seen previous data.) IBW/kg (Calculated) : 54.7  Temp (24hrs), Avg:98.1 F (36.7 C), Min:97.8 F (36.6 C), Max:98.5 F (36.9 C)  Recent Labs  Lab 05/24/17 1433 05/24/17 1452 05/24/17 1811 05/25/17 0536  WBC 12.3*  --   --  6.8  CREATININE 0.63  --   --  0.60  LATICACIDVEN  --  1.35 1.18  --     Estimated Creatinine Clearance: 88.5 mL/min (by C-G formula based on SCr of 0.6 mg/dL).    No Known Allergies  Antimicrobials this admission:  Ceftriaxone 12/13 >> * Gentamicin 12/17>> *  Dose adjustments this admission:    Microbiology results:  12/15 BCx x 2: 12/14 BCx x 2: ngtd 12/13 BCx x 2: viridans streptococcus- I: PCN; S: CTX 12/13UCx: neg  Thank you for allowing pharmacy to be a part of this patient's care.  Uvaldo Rising, BCPS  Clinical Pharmacist Pager [phone]  05/28/2017 6:10 PM

## 2017-05-29 ENCOUNTER — Inpatient Hospital Stay (HOSPITAL_COMMUNITY): Payer: 59

## 2017-05-29 ENCOUNTER — Encounter (HOSPITAL_COMMUNITY): Payer: Self-pay

## 2017-05-29 ENCOUNTER — Encounter (HOSPITAL_COMMUNITY)
Admission: EM | Disposition: A | Discharge status: Home Health | Payer: Self-pay | Source: Home / Self Care | Attending: Cardiothoracic Surgery

## 2017-05-29 DIAGNOSIS — I340 Nonrheumatic mitral (valve) insufficiency: Secondary | ICD-10-CM

## 2017-05-29 DIAGNOSIS — K047 Periapical abscess without sinus: Secondary | ICD-10-CM

## 2017-05-29 DIAGNOSIS — I330 Acute and subacute infective endocarditis: Secondary | ICD-10-CM

## 2017-05-29 HISTORY — PX: TEE WITHOUT CARDIOVERSION: SHX5443

## 2017-05-29 SURGERY — ECHOCARDIOGRAM, TRANSESOPHAGEAL
Anesthesia: Moderate Sedation

## 2017-05-29 MED ORDER — DIPHENHYDRAMINE HCL 50 MG/ML IJ SOLN
INTRAMUSCULAR | Status: AC
  Filled 2017-05-29: qty 1

## 2017-05-29 MED ORDER — METOPROLOL TARTRATE 5 MG/5ML IV SOLN
5.0000 mg | INTRAVENOUS | Status: DC | PRN
  Administered 2017-05-29 (×4): 5 mg via INTRAVENOUS

## 2017-05-29 MED ORDER — DILTIAZEM HCL 25 MG/5ML IV SOLN
INTRAVENOUS | Status: AC
  Filled 2017-05-29: qty 5

## 2017-05-29 MED ORDER — MIDAZOLAM HCL 5 MG/ML IJ SOLN
INTRAMUSCULAR | Status: AC
  Filled 2017-05-29: qty 2

## 2017-05-29 MED ORDER — DILTIAZEM HCL 25 MG/5ML IV SOLN
5.0000 mg | Freq: Once | INTRAVENOUS | Status: AC
  Administered 2017-05-29: 5 mg via INTRAVENOUS
  Filled 2017-05-29: qty 5

## 2017-05-29 MED ORDER — TRAZODONE HCL 100 MG PO TABS
100.0000 mg | ORAL_TABLET | Freq: Once | ORAL | Status: AC
  Administered 2017-05-29: 100 mg via ORAL
  Filled 2017-05-29: qty 1

## 2017-05-29 MED ORDER — METOPROLOL TARTRATE 50 MG PO TABS
50.0000 mg | ORAL_TABLET | Freq: Once | ORAL | Status: AC
  Administered 2017-05-29: 50 mg via ORAL
  Filled 2017-05-29: qty 1

## 2017-05-29 MED ORDER — NITROGLYCERIN 0.4 MG SL SUBL
0.8000 mg | SUBLINGUAL_TABLET | Freq: Once | SUBLINGUAL | Status: AC
  Administered 2017-05-29: 0.8 mg via SUBLINGUAL

## 2017-05-29 MED ORDER — MIDAZOLAM HCL 10 MG/2ML IJ SOLN
INTRAMUSCULAR | Status: DC | PRN
  Administered 2017-05-29: 1.5 mg via INTRAVENOUS
  Administered 2017-05-29: .5 mg via INTRAVENOUS
  Administered 2017-05-29: 2 mg via INTRAVENOUS

## 2017-05-29 MED ORDER — METOPROLOL TARTRATE 25 MG PO TABS
25.0000 mg | ORAL_TABLET | Freq: Two times a day (BID) | ORAL | Status: DC
  Administered 2017-05-29 – 2017-06-03 (×12): 25 mg via ORAL
  Filled 2017-05-29 (×12): qty 1

## 2017-05-29 MED ORDER — METOPROLOL TARTRATE 5 MG/5ML IV SOLN
INTRAVENOUS | Status: AC
  Filled 2017-05-29: qty 15

## 2017-05-29 MED ORDER — FENTANYL CITRATE (PF) 100 MCG/2ML IJ SOLN
INTRAMUSCULAR | Status: AC
  Filled 2017-05-29: qty 2

## 2017-05-29 MED ORDER — METOPROLOL TARTRATE 5 MG/5ML IV SOLN
INTRAVENOUS | Status: AC
  Filled 2017-05-29: qty 5

## 2017-05-29 MED ORDER — IOPAMIDOL (ISOVUE-370) INJECTION 76%
INTRAVENOUS | Status: AC
  Administered 2017-05-29: 80 mL
  Filled 2017-05-29: qty 100

## 2017-05-29 MED ORDER — TRAZODONE HCL 50 MG PO TABS
100.0000 mg | ORAL_TABLET | Freq: Every evening | ORAL | Status: DC | PRN
  Administered 2017-05-29 – 2017-05-31 (×2): 100 mg via ORAL
  Filled 2017-05-29 (×3): qty 1

## 2017-05-29 MED ORDER — FENTANYL CITRATE (PF) 100 MCG/2ML IJ SOLN
INTRAMUSCULAR | Status: DC | PRN
  Administered 2017-05-29: 25 ug via INTRAVENOUS

## 2017-05-29 MED ORDER — NITROGLYCERIN 0.4 MG SL SUBL
SUBLINGUAL_TABLET | SUBLINGUAL | Status: AC
  Filled 2017-05-29: qty 2

## 2017-05-29 NOTE — Progress Notes (Signed)
PROGRESS NOTE    Krystal Shaffer  XHB:[id number] DOB: Mar 08, 1976 DOA: 05/24/2017 PCP: Sofie Rower, PA-C   Brief Narrative: Krystal Shaffer is a 41 y.o. female with medical history significant for recent E. coli UTI with failed outpatient management requiring short inpatient stay for antibiotic therapy who presents to the ED with continued fever, chills, night sweats, myalgias. She presented with sepsis concerning for endocarditis with new murmur.   Assessment & Plan:   Principal Problem:   Aortic valve endocarditis Active Problems:   Sepsis ([place])   Bacteremia due to Streptococcus   Diastolic murmur   Bacterial vaginosis   Atypical chest pain   Temporal headache   Streptococcal sepsis ([facility])   Right-sided chest pain   Sepsis Streptococcal viridans bacteremia -BCID positive for strep. 2/2 cultures (12/13) positive.  -continue ceftriaxone and gentamicin  -will follow ID and CVTS rec's -Dr. Enrique Sack on board and looking for time to take her to OR for dental extraction (infection supposed to come from bad teeth) -cardiology consulted for stratification prior to valve replacement.  Aortic valve vegetation -New. With high concerns for underlying endocarditis. -TEE confirmed large vegetation  -Cardiothoracic surgery planning valve replacement pending cardiac coronary workup -Dr. Enrique Sack planning dental extractions in preparation for surgery -cardiology looking to perform coronary CT for stratification   History of UTI/Flank pain -Patient with under-treated UTI secondary to non-adherence secondary to nausea and vomiting.  -Urine culture with no growth -no dysuria or fever -currently receiving rocephin   Bacterial vaginosis -treated with flagyl  -no dysuria or vaginal discharge reported   Normocytic anemia -Iron studies in line with anemia of chronic disease -will monitor Hgb trend   Headache -chronic and stable -continue PRN analgesics   Atypical chest  pain -Most likely associated reflux and/or dysmotility.  -Following GI recommendations plan is for esophageal barium testing  -Continue as needed GI cocktail, PPI and analgesics. -Also empirically started on Diflucan. -Troponins. No ischemic changes on EKG and reassuring echocardiogram. -Service considering coronary artery CT for stratification; will follow rec's.  DVT prophylaxis: Lovenox Code Status: Full code Family Communication: None at bedside Disposition Plan: Remains inpatient. Planning to have dental extractions and subsequent AVR. Continue IV antibiotics and follow cardiology rec's for coronary stratification.   Consultants:   None  Procedures:   Echocardiogram (12/14) Study Conclusions  - Left ventricle: The cavity size was normal. Systolic function was   normal. The estimated ejection fraction was in the range of 60%   to 65%. Wall motion was normal; there were no regional wall   motion abnormalities. Doppler parameters are consistent with   abnormal left ventricular relaxation (grade 1 diastolic   dysfunction). - Aortic valve: Trileaflet; moderately thickened leaflets with 1cm   x 1cm mass on the right coronary cusp, mild mobility, suggestive   of vegetation. If no signs of infection, consider fibroelastoma.   There was severe stenosis. Valve area (VTI): 1.32 cm^2. Valve   area (Vmax): 1.41 cm^2. Valve area (Vmean): 1.28 cm^2.   TEE (12/18) - Left ventricle: The cavity size was normal. Wall thickness was   normal. Systolic function was normal. The estimated ejection   fraction was in the range of 60% to 65%. - Aortic valve: Functionally bicuspid; moderately thickened   leaflets. There was a vegetation. There was a large, 1.4 cm (L) x   0.7 cm (W), fixed vegetation on the noncoronary cusp. Another   vegetation is present at the tip of the fusion point of right and   left  coronary cusp, 0.7 x 0.5cm. There was severe regurgitation. - Mitral valve: No evidence  of vegetation. There was mild   regurgitation. - Left atrium: The atrium was dilated. No evidence of thrombus in   the appendage. - Right atrium: No evidence of thrombus in the atrial cavity or   appendage. - Atrial septum: No defect or patent foramen ovale was identified. - Tricuspid valve: No evidence of vegetation. - Pulmonic valve: No evidence of vegetation. - Superior vena cava: The study excluded a thrombus. - Pericardium, extracardiac: A trivial pericardial effusion was   identified.  Antimicrobials:  Ceftriaxone  Gentamicin   Diflucan    Subjective: Afebrile, denies chest pain, no palpitations, complaining of being home at this moment no having any pain in the chest.  Objective: Vitals:   05/29/17 0847 05/29/17 0850 05/29/17 0858 05/29/17 0909  BP: (!) 130/51 (!) 130/51 (!) 144/63 (!) 147/60  Pulse: 90 91 94 99  Resp: 17 (!) 25 (!) 26   Temp: 98.2 F (36.8 C)   98.1 F (36.7 C)  TempSrc: Oral   Oral  SpO2: 96% 95% 96% 98%  Weight:      Height:        Intake/Output Summary (Last 24 hours) at 05/29/2017 1250 Last data filed at 05/29/2017 0500 Gross per 24 hour  Intake 585 ml  Output -  Net 585 ml   Filed Weights   05/24/17 1405 05/29/17 0730  Weight: 68 kg (150 lb) 68 kg (150 lb)    Examination: General exam: Afebrile, calm, comfortable and in no acute distress.  Patient reports some intermittent episodes of chest pain throughout the night partially relieved by GI cocktail, nitroglycerin).  She is chest pain-free at this moment, shortness of breath, no vomiting, no abdominal pain.  Patient given her health and by mouth in order to pursuit CT coronary images and TEE (last one just completed earlier). Respiratory system: Clear to auscultation bilaterally, normal respiratory effort.   Cardiovascular system: Positive systolic ejection murmur, regular rate and rhythm, no JVD, no rubs, no gallops. Gastrointestinal system: Soft, nontender, nondistended, positive  bowel sounds.  No guarding. Central nervous system: Cranial nerves intact, alert, awake; no focal neurologic deficit appreciated.   Extremities: No edema, no cyanosis, no tenderness. Good range of motion. Skin: No petechiae, no bruises, no rash, no open ulcers.   Psychiatry: No suicidal ideation, no hallucinations stable and appropriate mood.  Patient with normal judgment and insight.   Data Reviewed: I have personally reviewed following labs and imaging studies  CBC: Recent Labs  Lab 05/24/17 1433 05/25/17 0034 05/25/17 0536  WBC 12.3*  --  6.8  NEUTROABS  --  5.4  --   HGB 10.1*  --  8.8*  HCT 32.2* 27.9* 28.7*  MCV 81.9  --  82.7  PLT 298  --  924   Basic Metabolic Panel: Recent Labs  Lab 05/24/17 1433 05/25/17 0034 05/25/17 0536  NA 133*  --  134*  K 3.5  --  3.7  CL 103  --  105  CO2 22  --  24  GLUCOSE 97  --  91  BUN 7  --  7  CREATININE 0.63  --  0.60  CALCIUM 8.7*  --  7.8*  MG  --  1.6*  --   PHOS  --  3.1  --    GFR: Estimated Creatinine Clearance: 88.5 mL/min (by C-G formula based on SCr of 0.6 mg/dL).   Liver Function Tests: Recent  Labs  Lab 05/24/17 1433  AST 15  ALT 10*  ALKPHOS 64  BILITOT 0.5  PROT 7.4  ALBUMIN 2.8*   Cardiac Enzymes: Recent Labs  Lab 05/28/17 1154  TROPONINI <0.03   Sepsis Labs: Recent Labs  Lab 05/24/17 1452 05/24/17 1811  LATICACIDVEN 1.35 1.18    Recent Results (from the past 240 hour(s))  Urine culture     Status: None   Collection Time: 05/24/17  6:06 PM  Result Value Ref Range Status   Specimen Description URINE, RANDOM  Final   Special Requests NONE  Final   Culture NO GROWTH  Final   Report Status 05/26/2017 FINAL  Final  Culture, blood (routine x 2)     Status: Abnormal   Collection Time: 05/24/17 11:05 PM  Result Value Ref Range Status   Specimen Description BLOOD RIGHT ANTECUBITAL  Final   Special Requests   Final    BOTTLES DRAWN AEROBIC AND ANAEROBIC Blood Culture adequate volume   Culture   Setup Time   Final    GRAM POSITIVE COCCI IN CHAINS IN BOTH AEROBIC AND ANAEROBIC BOTTLES CRITICAL VALUE NOTED.  VALUE IS CONSISTENT WITH PREVIOUSLY REPORTED AND CALLED VALUE.    Culture (A)  Final    VIRIDANS STREPTOCOCCUS SUSCEPTIBILITIES PERFORMED ON PREVIOUS CULTURE WITHIN THE LAST 5 DAYS.    Report Status 05/27/2017 FINAL  Final  Culture, blood (routine x 2)     Status: Abnormal   Collection Time: 05/24/17 11:14 PM  Result Value Ref Range Status   Specimen Description BLOOD LEFT WRIST  Final   Special Requests   Final    BOTTLES DRAWN AEROBIC AND ANAEROBIC Blood Culture adequate volume   Culture  Setup Time   Final    GRAM POSITIVE COCCI IN CHAINS IN BOTH AEROBIC AND ANAEROBIC BOTTLES CRITICAL RESULT CALLED TO, READ BACK BY AND VERIFIED WITH: JNARKLE,PHARMD @2115  05/25/17 BY LHOWARD    Culture VIRIDANS STREPTOCOCCUS (A)  Final   Report Status 05/27/2017 FINAL  Final   Organism ID, Bacteria VIRIDANS STREPTOCOCCUS  Final      Susceptibility   Viridans streptococcus - MIC*    PENICILLIN 1 INTERMEDIATE Intermediate     CEFTRIAXONE 1 SENSITIVE Sensitive     ERYTHROMYCIN <=0.12 SENSITIVE Sensitive     LEVOFLOXACIN 1 SENSITIVE Sensitive     VANCOMYCIN 0.5 SENSITIVE Sensitive     * VIRIDANS STREPTOCOCCUS  Blood Culture ID Panel (Reflexed)     Status: Abnormal   Collection Time: 05/24/17 11:14 PM  Result Value Ref Range Status   Enterococcus species NOT DETECTED NOT DETECTED Final   Listeria monocytogenes NOT DETECTED NOT DETECTED Final   Staphylococcus species NOT DETECTED NOT DETECTED Final   Staphylococcus aureus NOT DETECTED NOT DETECTED Final   Streptococcus species DETECTED (A) NOT DETECTED Final    Comment: Not Enterococcus species, Streptococcus agalactiae, Streptococcus pyogenes, or Streptococcus pneumoniae. CRITICAL RESULT CALLED TO, READ BACK BY AND VERIFIED WITH: JNARKLE,PHARMD @2115  05/25/17 BY LHOWARD    Streptococcus agalactiae NOT DETECTED NOT DETECTED Final    Streptococcus pneumoniae NOT DETECTED NOT DETECTED Final   Streptococcus pyogenes NOT DETECTED NOT DETECTED Final   Acinetobacter baumannii NOT DETECTED NOT DETECTED Final   Enterobacteriaceae species NOT DETECTED NOT DETECTED Final   Enterobacter cloacae complex NOT DETECTED NOT DETECTED Final   Escherichia coli NOT DETECTED NOT DETECTED Final   Klebsiella oxytoca NOT DETECTED NOT DETECTED Final   Klebsiella pneumoniae NOT DETECTED NOT DETECTED Final   Proteus species NOT DETECTED  NOT DETECTED Final   Serratia marcescens NOT DETECTED NOT DETECTED Final   Haemophilus influenzae NOT DETECTED NOT DETECTED Final   Neisseria meningitidis NOT DETECTED NOT DETECTED Final   Pseudomonas aeruginosa NOT DETECTED NOT DETECTED Final   Candida albicans NOT DETECTED NOT DETECTED Final   Candida glabrata NOT DETECTED NOT DETECTED Final   Candida krusei NOT DETECTED NOT DETECTED Final   Candida parapsilosis NOT DETECTED NOT DETECTED Final   Candida tropicalis NOT DETECTED NOT DETECTED Final  Culture, blood (Routine X 2) w Reflex to ID Panel     Status: None (Preliminary result)   Collection Time: 05/25/17  3:25 PM  Result Value Ref Range Status   Specimen Description BLOOD BLOOD RIGHT HAND  Final   Special Requests IN PEDIATRIC BOTTLE Blood Culture adequate volume  Final   Culture NO GROWTH 3 DAYS  Final   Report Status PENDING  Incomplete  Culture, blood (routine x 2)     Status: None (Preliminary result)   Collection Time: 05/26/17 12:20 AM  Result Value Ref Range Status   Specimen Description BLOOD LEFT ANTECUBITAL  Final   Special Requests   Final    BOTTLES DRAWN AEROBIC AND ANAEROBIC Blood Culture adequate volume   Culture NO GROWTH 2 DAYS  Final   Report Status PENDING  Incomplete  Culture, blood (routine x 2)     Status: None (Preliminary result)   Collection Time: 05/26/17 12:22 AM  Result Value Ref Range Status   Specimen Description BLOOD LEFT HAND  Final   Special Requests   Final     BOTTLES DRAWN AEROBIC ONLY Blood Culture adequate volume   Culture NO GROWTH 2 DAYS  Final   Report Status PENDING  Incomplete    Radiology Studies: No results found.  Scheduled Meds: . enoxaparin (LOVENOX) injection  40 mg Subcutaneous Q24H  . fluconazole  100 mg Oral Daily  . pantoprazole  40 mg Oral BID AC  . sucralfate  1 g Oral TID PC & HS   Continuous Infusions: . cefTRIAXone (ROCEPHIN)  IV Stopped (05/28/17 2156)  . gentamicin Stopped (05/28/17 1946)     LOS: 3 days    Barton Dubois, MD [facility] 05/29/2017, 12:50 PM Pager: [phone]  If 7PM-7AM, please contact night-coverage [email or web address] Password [email or web address] 05/29/2017, 12:50 PM

## 2017-05-29 NOTE — Progress Notes (Signed)
[facility] Infusion Coordinator will follow pt with ID team to support home IV ABX at DC as needed.  If patient discharges after hours, please call [phone].   Larry Sierras 05/29/2017, 9:16 AM

## 2017-05-29 NOTE — Progress Notes (Signed)
  Echocardiogram Echocardiogram Transesophageal has been performed.  Krystal Shaffer 05/29/2017, 8:53 AM

## 2017-05-29 NOTE — Interval H&P Note (Signed)
History and Physical Interval Note:  05/29/2017 8:15 AM  Krystal Shaffer  has presented today for surgery, with the diagnosis of bacteremia  The various methods of treatment have been discussed with the patient and family. After consideration of risks, benefits and other options for treatment, the patient has consented to  Procedure(s): TRANSESOPHAGEAL ECHOCARDIOGRAM (TEE) (N/A) as a surgical intervention .  The patient's history has been reviewed, patient examined, no change in status, stable for surgery.  I have reviewed the patient's chart and labs.  Questions were answered to the patient's satisfaction.     [employer]

## 2017-05-29 NOTE — Progress Notes (Signed)
@  2147 Pt noted crying, c/o chest tightness and "felt like somebody punching" Nitro SL given. Pt states "it`s easing off" but requested Morphine w/c relieved the pain.

## 2017-05-29 NOTE — CV Procedure (Signed)
    Transesophageal echocardiogram  Indications: Endocarditis, aortic valve regurgitation  Timeout performed. During this procedure the patient is administered a total of Versed 4 mg and Fentanyl 50 mcg to achieve and maintain moderate conscious sedation.  The patient's heart rate, blood pressure, and oxygen saturation are monitored continuously during the procedure. The period of conscious sedation is 35 minutes, of which I was present face-to-face 100% of this time.  Findings:   -Severe aortic valve insufficiency secondary to bacterial endocarditis/vegetation.  Largest vegetation is attached to the noncoronary cusp portion of the aortic valve and measures slightly over 1 cm in length.  There is another smaller portion that is attached to the fusion of the right and left coronary cusp measuring approximately 0.6 cm in diameter.  Please see final report for full details.  -Functional bicuspid aortic valve with right and left coronary cusp effusion.  -Mild mitral regurgitation no evidence of vegetation  -No tricuspid regurgitation, no tricuspid valve vegetation  -No pulmonic valve vegetation  -Normal ejection fraction, 55%  Candee Furbish, MD

## 2017-05-29 NOTE — Progress Notes (Signed)
Coronary CT reviewed. She has normal coronary arteries without CAD. Ok to proceed with AVR from our standpoint.   Peter Martinique MD, [facility]

## 2017-05-30 ENCOUNTER — Inpatient Hospital Stay (HOSPITAL_COMMUNITY): Payer: 59

## 2017-05-30 DIAGNOSIS — D649 Anemia, unspecified: Secondary | ICD-10-CM

## 2017-05-30 DIAGNOSIS — K224 Dyskinesia of esophagus: Secondary | ICD-10-CM

## 2017-05-30 LAB — BASIC METABOLIC PANEL
Anion gap: 9 (ref 5–15)
BUN: 9 mg/dL (ref 6–20)
CALCIUM: 8.6 mg/dL — AB (ref 8.9–10.3)
CO2: 23 mmol/L (ref 22–32)
CREATININE: 0.79 mg/dL (ref 0.44–1.00)
Chloride: 102 mmol/L (ref 101–111)
GFR calc Af Amer: >60 mL/min (ref >60)
GLUCOSE: 82 mg/dL (ref 65–99)
Potassium: 4.1 mmol/L (ref 3.5–5.1)
SODIUM: 134 mmol/L — AB (ref 135–145)

## 2017-05-30 LAB — CULTURE, BLOOD (ROUTINE X 2)
Culture: NO GROWTH
Special Requests: ADEQUATE

## 2017-05-30 MED ORDER — VALPROATE SODIUM 500 MG/5ML IV SOLN
1.0000 g | Freq: Once | INTRAVENOUS | Status: AC
  Administered 2017-05-30: 1000 mg via INTRAVENOUS
  Filled 2017-05-30: qty 10

## 2017-05-30 MED ORDER — FLUCONAZOLE 200 MG PO TABS
200.0000 mg | ORAL_TABLET | Freq: Every day | ORAL | Status: DC
  Administered 2017-06-01 – 2017-06-03 (×3): 200 mg via ORAL
  Filled 2017-05-30: qty 2
  Filled 2017-05-30: qty 1
  Filled 2017-05-30 (×2): qty 2

## 2017-05-30 MED ORDER — BUTALBITAL-APAP-CAFFEINE 50-325-40 MG PO TABS
1.0000 | ORAL_TABLET | Freq: Four times a day (QID) | ORAL | Status: DC | PRN
  Administered 2017-05-30 – 2017-06-04 (×4): 2 via ORAL
  Filled 2017-05-30 (×4): qty 2

## 2017-05-30 NOTE — Progress Notes (Signed)
PROGRESS NOTE    Krystal Shaffer  JHE:[id number] DOB: [birth year] DOA: 05/24/2017 PCP: Sofie Rower, PA-C   Brief Narrative: Krystal Shaffer is a 41 y.o. female with medical history significant for recent E. coli UTI with failed outpatient management requiring short inpatient stay for antibiotic therapy who presents to the ED with continued fever, chills, night sweats, myalgias. She presented with sepsis concerning for endocarditis with new murmur.   Assessment & Plan:   Principal Problem:   Aortic valve endocarditis Active Problems:   Sepsis ([place])   Bacteremia due to Streptococcus   Diastolic murmur   Bacterial vaginosis   Atypical chest pain   Temporal headache   Streptococcal sepsis ([facility])   Right-sided chest pain   Sepsis Streptococcal viridans bacteremia -BCID positive for strep. 2/2 cultures (12/13) positive.  -continue ceftriaxone and gentamicin  -will follow ID and CVTS rec's -Dr. Enrique Sack on board and looking for time to take her to OR for dental extraction (infection supposed to come from bad teeth) -cardiology consulted for stratification prior to valve replacement; and work up demonstrated normal coronary artery and from their stand point clear to proceed with surgery w/o any further work up..  Aortic valve vegetation -New. With high concerns for underlying endocarditis. -TEE confirmed large vegetation  -Cardiothoracic surgery planning valve replacement pending cardiac coronary workup -Dr. Enrique Sack planning dental extractions in preparation for surgery -cardiology has done coronary CT and results demonstrated no abnormalities -patient is good to proceed with surgery from cardiology stand point   History of UTI/Flank pain -Patient with under-treated UTI secondary to non-adherence secondary to nausea and vomiting prior to admission.  -Urine culture with no growth -patient is now denying dysuria and has remained afebrile. -currently receiving rocephin for  her endocarditis (which empirically with also treat underlying UTI)  Bacterial vaginosis -treated with flagyl  -no dysuria or vaginal discharge reported   Normocytic anemia -Iron studies in line with anemia of chronic disease -will monitor Hgb trend   Headache -complaining of migraine today -will give depakote 1000 mg once -PRN fioricet will be also added    Atypical chest pain -Most likely associated reflux and/or dysmotility.  -Following GI recommendations plan is for esophageal barium testing  -Continue as needed GI cocktail, PPI twice a day and analgesics. -Also empirically started on Diflucan. -Troponins. No ischemic changes on EKG and reassuring echocardiogram. -coronary CT has rule out ischemic cause for her discomfort.  -improved overall  DVT prophylaxis: Lovenox Code Status: Full code Family Communication: None at bedside Disposition Plan: Remains inpatient. Planning to have dental extractions and subsequent AVR. Continue IV antibiotics and follow cardiology rec's for coronary stratification.   Consultants:   None  Procedures:   Echocardiogram (12/14) Study Conclusions  - Left ventricle: The cavity size was normal. Systolic function was   normal. The estimated ejection fraction was in the range of 60%   to 65%. Wall motion was normal; there were no regional wall   motion abnormalities. Doppler parameters are consistent with   abnormal left ventricular relaxation (grade 1 diastolic   dysfunction). - Aortic valve: Trileaflet; moderately thickened leaflets with 1cm   x 1cm mass on the right coronary cusp, mild mobility, suggestive   of vegetation. If no signs of infection, consider fibroelastoma.   There was severe stenosis. Valve area (VTI): 1.32 cm^2. Valve   area (Vmax): 1.41 cm^2. Valve area (Vmean): 1.28 cm^2.   TEE (12/18) - Left ventricle: The cavity size was normal. Wall thickness was   normal. Systolic  function was normal. The estimated ejection    fraction was in the range of 60% to 65%. - Aortic valve: Functionally bicuspid; moderately thickened   leaflets. There was a vegetation. There was a large, 1.4 cm (L) x   0.7 cm (W), fixed vegetation on the noncoronary cusp. Another   vegetation is present at the tip of the fusion point of right and   left coronary cusp, 0.7 x 0.5cm. There was severe regurgitation. - Mitral valve: No evidence of vegetation. There was mild   regurgitation. - Left atrium: The atrium was dilated. No evidence of thrombus in   the appendage. - Right atrium: No evidence of thrombus in the atrial cavity or   appendage. - Atrial septum: No defect or patent foramen ovale was identified. - Tricuspid valve: No evidence of vegetation. - Pulmonic valve: No evidence of vegetation. - Superior vena cava: The study excluded a thrombus. - Pericardium, extracardiac: A trivial pericardial effusion was   identified.  Antimicrobials:  Ceftriaxone  Gentamicin   Diflucan    Subjective: No fever, no nausea, no vomiting. Reporting some HA's and still continue to have intermittent chest discomfort (even much improved and less frequent)  Objective: Vitals:   05/29/17 1332 05/29/17 1600 05/29/17 2100 05/30/17 0624  BP: (!) 162/52 (!) 125/56 (!) 132/56 (!) 127/50  Pulse: 91 80 84 89  Resp: 18 18 17 17   Temp: 97.8 F (36.6 C)  98 F (36.7 C) 98.2 F (36.8 C)  TempSrc: Oral     SpO2: 100% 99% 100% 97%  Weight:      Height:        Intake/Output Summary (Last 24 hours) at 05/30/2017 1709 Last data filed at 05/30/2017 1707 Gross per 24 hour  Intake 425 ml  Output -  Net 425 ml   Filed Weights   05/24/17 1405 05/29/17 0730  Weight: 68 kg (150 lb) 68 kg (150 lb)    Examination: General exam: no fever, no nausea, no vomiting. Complaining of HA's and having intermittent chest discomfort.  Rest of physical exam unchanged from my evaluation on 12/18; see below for details: Respiratory system: Clear to  auscultation bilaterally, normal respiratory effort.   Cardiovascular system: Positive systolic ejection murmur, regular rate and rhythm, no JVD, no rubs, no gallops. Gastrointestinal system: Soft, nontender, nondistended, positive bowel sounds.  No guarding. Central nervous system: Cranial nerves intact, alert, awake; no focal neurologic deficit appreciated.   Extremities: No edema, no cyanosis, no tenderness. Good range of motion. Skin: No petechiae, no bruises, no rash, no open ulcers.   Psychiatry: No suicidal ideation, no hallucinations stable and appropriate mood.  Patient with normal judgment and insight.   Data Reviewed: I have personally reviewed following labs and imaging studies  CBC: Recent Labs  Lab 05/24/17 1433 05/25/17 0034 05/25/17 0536  WBC 12.3*  --  6.8  NEUTROABS  --  5.4  --   HGB 10.1*  --  8.8*  HCT 32.2* 27.9* 28.7*  MCV 81.9  --  82.7  PLT 298  --  341   Basic Metabolic Panel: Recent Labs  Lab 05/24/17 1433 05/25/17 0034 05/25/17 0536 05/30/17 0424  NA 133*  --  134* 134*  K 3.5  --  3.7 4.1  CL 103  --  105 102  CO2 22  --  24 23  GLUCOSE 97  --  91 82  BUN 7  --  7 9  CREATININE 0.63  --  0.60 0.79  CALCIUM 8.7*  --  7.8* 8.6*  MG  --  1.6*  --   --   PHOS  --  3.1  --   --    GFR: Estimated Creatinine Clearance: 88.5 mL/min (by C-G formula based on SCr of 0.79 mg/dL).   Liver Function Tests: Recent Labs  Lab 05/24/17 1433  AST 15  ALT 10*  ALKPHOS 64  BILITOT 0.5  PROT 7.4  ALBUMIN 2.8*   Cardiac Enzymes: Recent Labs  Lab 05/28/17 1154  TROPONINI <0.03   Sepsis Labs: Recent Labs  Lab 05/24/17 1452 05/24/17 1811  LATICACIDVEN 1.35 1.18    Recent Results (from the past 240 hour(s))  Urine culture     Status: None   Collection Time: 05/24/17  6:06 PM  Result Value Ref Range Status   Specimen Description URINE, RANDOM  Final   Special Requests NONE  Final   Culture NO GROWTH  Final   Report Status 05/26/2017 FINAL   Final  Culture, blood (routine x 2)     Status: Abnormal   Collection Time: 05/24/17 11:05 PM  Result Value Ref Range Status   Specimen Description BLOOD RIGHT ANTECUBITAL  Final   Special Requests   Final    BOTTLES DRAWN AEROBIC AND ANAEROBIC Blood Culture adequate volume   Culture  Setup Time   Final    GRAM POSITIVE COCCI IN CHAINS IN BOTH AEROBIC AND ANAEROBIC BOTTLES CRITICAL VALUE NOTED.  VALUE IS CONSISTENT WITH PREVIOUSLY REPORTED AND CALLED VALUE.    Culture (A)  Final    VIRIDANS STREPTOCOCCUS SUSCEPTIBILITIES PERFORMED ON PREVIOUS CULTURE WITHIN THE LAST 5 DAYS.    Report Status 05/27/2017 FINAL  Final  Culture, blood (routine x 2)     Status: Abnormal   Collection Time: 05/24/17 11:14 PM  Result Value Ref Range Status   Specimen Description BLOOD LEFT WRIST  Final   Special Requests   Final    BOTTLES DRAWN AEROBIC AND ANAEROBIC Blood Culture adequate volume   Culture  Setup Time   Final    GRAM POSITIVE COCCI IN CHAINS IN BOTH AEROBIC AND ANAEROBIC BOTTLES CRITICAL RESULT CALLED TO, READ BACK BY AND VERIFIED WITH: JNARKLE,PHARMD @2115  05/25/17 BY LHOWARD    Culture VIRIDANS STREPTOCOCCUS (A)  Final   Report Status 05/27/2017 FINAL  Final   Organism ID, Bacteria VIRIDANS STREPTOCOCCUS  Final      Susceptibility   Viridans streptococcus - MIC*    PENICILLIN 1 INTERMEDIATE Intermediate     CEFTRIAXONE 1 SENSITIVE Sensitive     ERYTHROMYCIN <=0.12 SENSITIVE Sensitive     LEVOFLOXACIN 1 SENSITIVE Sensitive     VANCOMYCIN 0.5 SENSITIVE Sensitive     * VIRIDANS STREPTOCOCCUS  Blood Culture ID Panel (Reflexed)     Status: Abnormal   Collection Time: 05/24/17 11:14 PM  Result Value Ref Range Status   Enterococcus species NOT DETECTED NOT DETECTED Final   Listeria monocytogenes NOT DETECTED NOT DETECTED Final   Staphylococcus species NOT DETECTED NOT DETECTED Final   Staphylococcus aureus NOT DETECTED NOT DETECTED Final   Streptococcus species DETECTED (A) NOT  DETECTED Final    Comment: Not Enterococcus species, Streptococcus agalactiae, Streptococcus pyogenes, or Streptococcus pneumoniae. CRITICAL RESULT CALLED TO, READ BACK BY AND VERIFIED WITH: JNARKLE,PHARMD @2115  05/25/17 BY LHOWARD    Streptococcus agalactiae NOT DETECTED NOT DETECTED Final   Streptococcus pneumoniae NOT DETECTED NOT DETECTED Final   Streptococcus pyogenes NOT DETECTED NOT DETECTED Final   Acinetobacter baumannii NOT DETECTED NOT  DETECTED Final   Enterobacteriaceae species NOT DETECTED NOT DETECTED Final   Enterobacter cloacae complex NOT DETECTED NOT DETECTED Final   Escherichia coli NOT DETECTED NOT DETECTED Final   Klebsiella oxytoca NOT DETECTED NOT DETECTED Final   Klebsiella pneumoniae NOT DETECTED NOT DETECTED Final   Proteus species NOT DETECTED NOT DETECTED Final   Serratia marcescens NOT DETECTED NOT DETECTED Final   Haemophilus influenzae NOT DETECTED NOT DETECTED Final   Neisseria meningitidis NOT DETECTED NOT DETECTED Final   Pseudomonas aeruginosa NOT DETECTED NOT DETECTED Final   Candida albicans NOT DETECTED NOT DETECTED Final   Candida glabrata NOT DETECTED NOT DETECTED Final   Candida krusei NOT DETECTED NOT DETECTED Final   Candida parapsilosis NOT DETECTED NOT DETECTED Final   Candida tropicalis NOT DETECTED NOT DETECTED Final  Culture, blood (Routine X 2) w Reflex to ID Panel     Status: None   Collection Time: 05/25/17  3:25 PM  Result Value Ref Range Status   Specimen Description BLOOD BLOOD RIGHT HAND  Final   Special Requests IN PEDIATRIC BOTTLE Blood Culture adequate volume  Final   Culture NO GROWTH 5 DAYS  Final   Report Status 05/30/2017 FINAL  Final  Culture, blood (routine x 2)     Status: None (Preliminary result)   Collection Time: 05/26/17 12:20 AM  Result Value Ref Range Status   Specimen Description BLOOD LEFT ANTECUBITAL  Final   Special Requests   Final    BOTTLES DRAWN AEROBIC AND ANAEROBIC Blood Culture adequate volume    Culture NO GROWTH 4 DAYS  Final   Report Status PENDING  Incomplete  Culture, blood (routine x 2)     Status: None (Preliminary result)   Collection Time: 05/26/17 12:22 AM  Result Value Ref Range Status   Specimen Description BLOOD LEFT HAND  Final   Special Requests   Final    BOTTLES DRAWN AEROBIC ONLY Blood Culture adequate volume   Culture NO GROWTH 4 DAYS  Final   Report Status PENDING  Incomplete    Radiology Studies: Dg Esophagus  Result Date: 05/30/2017 CLINICAL DATA:  Chest pain. EXAM: ESOPHOGRAM / BARIUM SWALLOW / BARIUM TABLET STUDY TECHNIQUE: Combined double contrast and single contrast examination performed using effervescent crystals, thick barium liquid, and thin barium liquid. The patient was observed with fluoroscopy swallowing a 13 mm barium sulphate tablet. FLUOROSCOPY TIME:  Fluoroscopy Time:  1 minutes 0 seconds COMPARISON:  None. FINDINGS: The mucosa and motility of the esophagus are normal. There is no hiatal hernia, stricture, mass, or esophagitis. A 13 mm barium tablet passed immediately from the mouth to the stomach with no delay. IMPRESSION: Normal barium esophagram. Electronically Signed   By: Lorriane Shire M.D.   On: 05/30/2017 08:40   Ct Coronary Morph W/cta Cor W/score W/ca W/cm &/or Wo/cm  Addendum Date: 05/29/2017   ADDENDUM REPORT: 05/29/2017 17:40 CLINICAL DATA:  84 -year-old female with aortic valve endocarditis. EXAM: Cardiac/Coronary  CT TECHNIQUE: The patient was scanned on a [employer]. FINDINGS: A 120 kV prospective scan was triggered in the descending thoracic aorta at 111 HU's. Axial non-contrast 3 mm slices were carried out through the heart. The data set was analyzed on a dedicated work station and scored using the [place]. Gantry rotation speed was 250 msecs and collimation was .6 mm. 15 mg of iv Metoprolol and 0.8 mg of sl NTG was given. The 3D data set was reconstructed in 5% intervals of the 67-82 %  of the R-R cycle. Diastolic  phases were analyzed on a dedicated work station using MPR, MIP and VRT modes. The patient received 80 cc of contrast. Aorta:  Normal size.  No calcifications.  No dissection. Aortic Valve: Trileaflet. Thickening and vegetations seen on the left and non-coronary cusps. Coronary Arteries:  Normal coronary origin.  Right dominance. RCA is a large dominant artery that gives rise to PDA and a small PLA. There is no plaque. Left main is a large and long artery that gives rise to LAD and LCX arteries. Ther eis no plaque. LAD is a large vessel that gives rise to a small diagonal branch. There is motion in the proximal LAD however no plaque is seen. LCX is a non-dominant artery that gives rise to one OM1 branch. There is no plaque. Other findings: Normal pulmonary vein drainage into the left atrium. Normal let atrial appendage without a thrombus. No ASD or VSD is seen. Mildly dilated pulmonary artery measuring 32 mm. IMPRESSION: 1. Coronary calcium score of 0. This was 0 percentile for age and sex matched control. 2. Normal coronary origin with right dominance. 3. There is motion in the proximal LAD, however no plaque is seen. There is no evidence of CAD. 4. Mildly dilated pulmonary artery measuring 32 mm. 5. Aortic valve is trileaflet, thickened, with vegetations seen on the left and non-coronary cusps. Electronically Signed   By: Ena Dawley   On: 05/29/2017 17:40   Result Date: 05/29/2017 EXAM: OVER-READ INTERPRETATION  CT CHEST The following report is an over-read performed by radiologist Dr. Forest Gleason [facility] on 05/29/2017. This over-read does not include interpretation of cardiac or coronary anatomy or pathology. The coronary CTA interpretation by the cardiologist is attached. COMPARISON:  05/24/2017 chest CTA. FINDINGS: Vascular: Normal aortic caliber, without dissection. No central pulmonary embolism, on this non-dedicated study. Mediastinum/Nodes: No imaged thoracic adenopathy.  Lungs/Pleura: No pleural fluid. Lingular atelectasis. 4 mm right middle lobe pulmonary nodule on image 18/series 13. Upper Abdomen: Normal imaged portions of the liver, spleen, stomach, adrenal glands, left kidney. Musculoskeletal: No acute osseous abnormality. IMPRESSION: 1.  No acute findings in the imaged extracardiac chest. 2. Isolated 4 mm right middle lobe pulmonary nodule. No follow-up needed if patient is low-risk. Non-contrast chest CT can be considered in 12 months if patient is high-risk. This recommendation follows the consensus statement: Guidelines for Management of Incidental Pulmonary Nodules Detected on CT Images: From the Fleischner Society 2017; Radiology 2017; 284:228-243. Electronically Signed: By: Abigail Miyamoto M.D. On: 05/29/2017 16:57    Scheduled Meds: . [START ON 05/31/2017] fluconazole  200 mg Oral Daily  . metoprolol tartrate  25 mg Oral BID  . pantoprazole  40 mg Oral BID AC  . sucralfate  1 g Oral TID PC & HS   Continuous Infusions: . cefTRIAXone (ROCEPHIN)  IV Stopped (05/29/17 2231)  . gentamicin Stopped (05/29/17 1808)     LOS: 4 days    Barton Dubois, MD [facility] 05/30/2017, 5:09 PM Pager: [phone]  If 7PM-7AM, please contact night-coverage [email or web address] Password [email or web address] 05/30/2017, 5:09 PM

## 2017-05-30 NOTE — Progress Notes (Signed)
Daily Rounding Note  05/30/2017, 9:11 AM  LOS: 4 days   SUBJECTIVE:   Chief complaint:  Esophageal burning not an issue for ~ 24 hours.       Ba esophagram completed this AM  OBJECTIVE:         Vital signs in last 24 hours:    Temp:  [97.8 F (36.6 C)-98.2 F (36.8 C)] 98.2 F (36.8 C) (12/19 0624) Pulse Rate:  [80-91] 89 (12/19 0624) Resp:  [17-18] 17 (12/19 0624) BP: (125-162)/(50-56) 127/50 (12/19 0624) SpO2:  [97 %-100 %] 97 % (12/19 0624) Last BM Date: 05/29/17 Filed Weights   05/24/17 1405 05/29/17 0730  Weight: 68 kg (150 lb) 68 kg (150 lb)   General: looks well   Heart: RRR Chest: clear bil Abdomen: soft, NT, ND.  Active BS  Extremities: no CCE Neuro/Psych:  Oriented x 3. Fully alert and appropriate.     Studies/Results: Dg Esophagus  Result Date: 05/30/2017 CLINICAL DATA:  Chest pain. EXAM: ESOPHOGRAM / BARIUM SWALLOW / BARIUM TABLET STUDY TECHNIQUE: Combined double contrast and single contrast examination performed using effervescent crystals, thick barium liquid, and thin barium liquid. The patient was observed with fluoroscopy swallowing a 13 mm barium sulphate tablet. FLUOROSCOPY TIME:  Fluoroscopy Time:  1 minutes 0 seconds COMPARISON:  None. FINDINGS: The mucosa and motility of the esophagus are normal. There is no hiatal hernia, stricture, mass, or esophagitis. A 13 mm barium tablet passed immediately from the mouth to the stomach with no delay. IMPRESSION: Normal barium esophagram. Electronically Signed   By: Lorriane Shire M.D.   On: 05/30/2017 08:40   Ct Coronary Morph W/cta Cor W/score W/ca W/cm &/or Wo/cm  Addendum Date: 05/29/2017   ADDENDUM REPORT: 05/29/2017 17:40 CLINICAL DATA:  41 -year-old female with aortic valve endocarditis. EXAM: Cardiac/Coronary  CT TECHNIQUE: The patient was scanned on a [employer]. FINDINGS: A 120 kV prospective scan was triggered in the descending  thoracic aorta at 111 HU's. Axial non-contrast 3 mm slices were carried out through the heart. The data set was analyzed on a dedicated work station and scored using the [place]. Gantry rotation speed was 250 msecs and collimation was .6 mm. 15 mg of iv Metoprolol and 0.8 mg of sl NTG was given. The 3D data set was reconstructed in 5% intervals of the 67-82 % of the R-R cycle. Diastolic phases were analyzed on a dedicated work station using MPR, MIP and VRT modes. The patient received 80 cc of contrast. Aorta:  Normal size.  No calcifications.  No dissection. Aortic Valve: Trileaflet. Thickening and vegetations seen on the left and non-coronary cusps. Coronary Arteries:  Normal coronary origin.  Right dominance. RCA is a large dominant artery that gives rise to PDA and a small PLA. There is no plaque. Left main is a large and long artery that gives rise to LAD and LCX arteries. Ther eis no plaque. LAD is a large vessel that gives rise to a small diagonal branch. There is motion in the proximal LAD however no plaque is seen. LCX is a non-dominant artery that gives rise to one OM1 branch. There is no plaque. Other findings: Normal pulmonary vein drainage into the left atrium. Normal let atrial appendage without a thrombus. No ASD or VSD is seen. Mildly dilated pulmonary artery measuring 32 mm. IMPRESSION: 1. Coronary calcium score of 0. This was 0 percentile for age and sex matched control. 2. Normal coronary  origin with right dominance. 3. There is motion in the proximal LAD, however no plaque is seen. There is no evidence of CAD. 4. Mildly dilated pulmonary artery measuring 32 mm. 5. Aortic valve is trileaflet, thickened, with vegetations seen on the left and non-coronary cusps. Electronically Signed   By: Ena Dawley   On: 05/29/2017 17:40   Result Date: 05/29/2017 EXAM: OVER-READ INTERPRETATION  CT CHEST The following report is an over-read performed by radiologist Dr. Forest Gleason [facility] on 05/29/2017. This over-read does not include interpretation of cardiac or coronary anatomy or pathology. The coronary CTA interpretation by the cardiologist is attached. COMPARISON:  05/24/2017 chest CTA. FINDINGS: Vascular: Normal aortic caliber, without dissection. No central pulmonary embolism, on this non-dedicated study. Mediastinum/Nodes: No imaged thoracic adenopathy. Lungs/Pleura: No pleural fluid. Lingular atelectasis. 4 mm right middle lobe pulmonary nodule on image 18/series 13. Upper Abdomen: Normal imaged portions of the liver, spleen, stomach, adrenal glands, left kidney. Musculoskeletal: No acute osseous abnormality. IMPRESSION: 1.  No acute findings in the imaged extracardiac chest. 2. Isolated 4 mm right middle lobe pulmonary nodule. No follow-up needed if patient is low-risk. Non-contrast chest CT can be considered in 12 months if patient is high-risk. This recommendation follows the consensus statement: Guidelines for Management of Incidental Pulmonary Nodules Detected on CT Images: From the Fleischner Society 2017; Radiology 2017; 284:228-243. Electronically Signed: By: Abigail Miyamoto M.D. On: 05/29/2017 16:57    ASSESMENT:   *  Atypical chest pain;  Burning esophageal pain, resolved On empiric BID PPI, Carafate, Diflucan.    Esophagram unremarkable.    *  Strep Viridans endocarditis with severe aortic insufficiency. Dental dz likely source.  Requires valve replacement post dental extractions.  Dental surgery possibly Friday, with valve surgery early next week.  No CAD on 12/18 coronary CT.  TEE 12/18.    *  Normocytic anemia.  Low iron, low TIBC, Ferritin 56.  B12, Folate ok.          PLAN   *  Will discuss how long to continue Diflucan, Carafate and Protonix with Dr Carlean Purl.  Ultimately would keep on q day Protonix for 1 month after surgery.  Azucena Freed  05/30/2017, 9:11 AM Pager: [phone]  She is better - hope this keeps up  Would use fluconazole x 10  days  Stay on carafate and PPI for hospitalization and then qd PPI as above x 1 month post-op  If she has recurrent or persistent issues we could scope but signing off at this time  Gatha Mayer, MD, [facility] [phone] (pager) 05/30/2017 6:06 PM

## 2017-05-30 NOTE — Progress Notes (Signed)
Subjective:  cotninuing to have esophageal spasms  Antibiotics:  Anti-infectives (From admission, onward)   Start     Dose/Rate Route Frequency Ordered Stop   05/31/17 1000  fluconazole (DIFLUCAN) tablet 200 mg     200 mg Oral Daily 05/30/17 1412 06/10/17 0959   05/28/17 1815  gentamicin (GARAMYCIN) 200 mg in dextrose 5 % 50 mL IVPB     200 mg 110 mL/hr over 30 Minutes Intravenous Every 24 hours 05/28/17 1805     05/27/17 1515  fluconazole (DIFLUCAN) tablet 100 mg  Status:  Discontinued     100 mg Oral Daily 05/27/17 1515 05/30/17 1412   05/25/17 2200  cefTRIAXone (ROCEPHIN) 2 g in dextrose 5 % 50 mL IVPB     2 g 100 mL/hr over 30 Minutes Intravenous Every 24 hours 05/25/17 2125     05/25/17 0026  metroNIDAZOLE (FLAGYL) tablet 500 mg  Status:  Discontinued     500 mg Oral Every 12 hours 05/25/17 0026 05/26/17 1538   05/24/17 2300  cefTRIAXone (ROCEPHIN) 1 g in dextrose 5 % 50 mL IVPB     1 g 100 mL/hr over 30 Minutes Intravenous  Once 05/24/17 2247 05/24/17 2349   05/24/17 2300  vancomycin (VANCOCIN) IVPB 1000 mg/200 mL premix     1,000 mg 200 mL/hr over 60 Minutes Intravenous  Once 05/24/17 2247 05/25/17 0018      Medications: Scheduled Meds: . [START ON 05/31/2017] fluconazole  200 mg Oral Daily  . metoprolol tartrate  25 mg Oral BID  . pantoprazole  40 mg Oral BID AC  . sucralfate  1 g Oral TID PC & HS   Continuous Infusions: . cefTRIAXone (ROCEPHIN)  IV Stopped (05/29/17 2231)  . gentamicin Stopped (05/29/17 1808)   PRN Meds:.acetaminophen **OR** acetaminophen, gi cocktail, metoprolol tartrate, morphine injection, nitroGLYCERIN, ondansetron **OR** ondansetron (ZOFRAN) IV, polyethylene glycol, traZODone    Objective: Weight change:   Intake/Output Summary (Last 24 hours) at 05/30/2017 1412 Last data filed at 05/30/2017 1038 Gross per 24 hour  Intake 225 ml  Output -  Net 225 ml   Blood pressure (!) 127/50, pulse 89, temperature 98.2 F (36.8 C),  resp. rate 17, height 5\' 4"  (1.626 m), weight 150 lb (68 kg), last menstrual period 05/25/2017, SpO2 97 %. Temp:  [98 F (36.7 C)-98.2 F (36.8 C)] 98.2 F (36.8 C) (12/19 0624) Pulse Rate:  [80-89] 89 (12/19 0624) Resp:  [17-18] 17 (12/19 0624) BP: (125-132)/(50-56) 127/50 (12/19 0624) SpO2:  [97 %-100 %] 97 % (12/19 4193)  Physical Exam: General: Alert and awake, oriented x3, not in any acute distress. HEENT: anicteric sclera, EOMI CVS regular rate, normal r,  SEM and diastolic murmurs loudest at RUSB with radiatoin to carotid Chest: clear to auscultation bilaterally, no wheezing, rales or rhonchi Abdomen: soft nontender, nondistended, normal bowel sounds, Extremities: no  clubbing or edema noted bilaterally Skin: no rashes Neuro: nonfocal  CBC:  CBC Latest Ref Rng & Units 05/25/2017 05/25/2017 05/24/2017  WBC 4.0 - 10.5 K/uL 6.8 - 12.3(H)  Hemoglobin 12.0 - 15.0 g/dL 8.8(L) - 10.1(L)  Hematocrit 36.0 - 46.0 % 28.7(L) 27.9(L) 32.2(L)  Platelets 150 - 400 K/uL 254 - 298      BMET Recent Labs    05/30/17 0424  NA 134*  K 4.1  CL 102  CO2 23  GLUCOSE 82  BUN 9  CREATININE 0.79  CALCIUM 8.6*     Liver Panel  No results  for input(s): PROT, ALBUMIN, AST, ALT, ALKPHOS, BILITOT, BILIDIR, IBILI in the last 72 hours.     Sedimentation Rate No results for input(s): ESRSEDRATE in the last 72 hours. C-Reactive Protein No results for input(s): CRP in the last 72 hours.  Micro Results: Recent Results (from the past 720 hour(s))  Urine culture     Status: Abnormal   Collection Time: 05/08/17  6:24 PM  Result Value Ref Range Status   Specimen Description URINE, CLEAN CATCH  Final   Special Requests Normal  Final   Culture >=100,000 COLONIES/mL ESCHERICHIA COLI (A)  Final   Report Status 05/10/2017 FINAL  Final   Organism ID, Bacteria ESCHERICHIA COLI (A)  Final      Susceptibility   Escherichia coli - MIC*    AMPICILLIN >=32 RESISTANT Resistant     CEFAZOLIN 16  SENSITIVE Sensitive     CEFTRIAXONE <=1 SENSITIVE Sensitive     CIPROFLOXACIN <=0.25 SENSITIVE Sensitive     GENTAMICIN <=1 SENSITIVE Sensitive     IMIPENEM <=0.25 SENSITIVE Sensitive     NITROFURANTOIN <=16 SENSITIVE Sensitive     TRIMETH/SULFA <=20 SENSITIVE Sensitive     AMPICILLIN/SULBACTAM >=32 RESISTANT Resistant     PIP/TAZO <=4 SENSITIVE Sensitive     Extended ESBL NEGATIVE Sensitive     * >=100,000 COLONIES/mL ESCHERICHIA COLI  Blood culture (routine x 2)     Status: None   Collection Time: 05/17/17  8:55 PM  Result Value Ref Range Status   Specimen Description BLOOD LEFT ANTECUBITAL  Final   Special Requests   Final    BOTTLES DRAWN AEROBIC AND ANAEROBIC Blood Culture adequate volume   Culture NO GROWTH 5 DAYS  Final   Report Status 05/22/2017 FINAL  Final  Blood culture (routine x 2)     Status: None   Collection Time: 05/17/17  9:05 PM  Result Value Ref Range Status   Specimen Description BLOOD RIGHT ARM  Final   Special Requests   Final    BOTTLES DRAWN AEROBIC AND ANAEROBIC Blood Culture adequate volume   Culture NO GROWTH 5 DAYS  Final   Report Status 05/22/2017 FINAL  Final  Wet prep, genital     Status: Abnormal   Collection Time: 05/17/17 10:07 PM  Result Value Ref Range Status   Yeast Wet Prep HPF POC NONE SEEN NONE SEEN Final   Trich, Wet Prep NONE SEEN NONE SEEN Final   Clue Cells Wet Prep HPF POC PRESENT (A) NONE SEEN Final   WBC, Wet Prep HPF POC FEW (A) NONE SEEN Final   Sperm NONE SEEN  Final  Urine culture     Status: None   Collection Time: 05/24/17  6:06 PM  Result Value Ref Range Status   Specimen Description URINE, RANDOM  Final   Special Requests NONE  Final   Culture NO GROWTH  Final   Report Status 05/26/2017 FINAL  Final  Culture, blood (routine x 2)     Status: Abnormal   Collection Time: 05/24/17 11:05 PM  Result Value Ref Range Status   Specimen Description BLOOD RIGHT ANTECUBITAL  Final   Special Requests   Final    BOTTLES DRAWN  AEROBIC AND ANAEROBIC Blood Culture adequate volume   Culture  Setup Time   Final    GRAM POSITIVE COCCI IN CHAINS IN BOTH AEROBIC AND ANAEROBIC BOTTLES CRITICAL VALUE NOTED.  VALUE IS CONSISTENT WITH PREVIOUSLY REPORTED AND CALLED VALUE.    Culture (A)  Final  VIRIDANS STREPTOCOCCUS SUSCEPTIBILITIES PERFORMED ON PREVIOUS CULTURE WITHIN THE LAST 5 DAYS.    Report Status 05/27/2017 FINAL  Final  Culture, blood (routine x 2)     Status: Abnormal   Collection Time: 05/24/17 11:14 PM  Result Value Ref Range Status   Specimen Description BLOOD LEFT WRIST  Final   Special Requests   Final    BOTTLES DRAWN AEROBIC AND ANAEROBIC Blood Culture adequate volume   Culture  Setup Time   Final    GRAM POSITIVE COCCI IN CHAINS IN BOTH AEROBIC AND ANAEROBIC BOTTLES CRITICAL RESULT CALLED TO, READ BACK BY AND VERIFIED WITH: JNARKLE,PHARMD @2115  05/25/17 BY LHOWARD    Culture VIRIDANS STREPTOCOCCUS (A)  Final   Report Status 05/27/2017 FINAL  Final   Organism ID, Bacteria VIRIDANS STREPTOCOCCUS  Final      Susceptibility   Viridans streptococcus - MIC*    PENICILLIN 1 INTERMEDIATE Intermediate     CEFTRIAXONE 1 SENSITIVE Sensitive     ERYTHROMYCIN <=0.12 SENSITIVE Sensitive     LEVOFLOXACIN 1 SENSITIVE Sensitive     VANCOMYCIN 0.5 SENSITIVE Sensitive     * VIRIDANS STREPTOCOCCUS  Blood Culture ID Panel (Reflexed)     Status: Abnormal   Collection Time: 05/24/17 11:14 PM  Result Value Ref Range Status   Enterococcus species NOT DETECTED NOT DETECTED Final   Listeria monocytogenes NOT DETECTED NOT DETECTED Final   Staphylococcus species NOT DETECTED NOT DETECTED Final   Staphylococcus aureus NOT DETECTED NOT DETECTED Final   Streptococcus species DETECTED (A) NOT DETECTED Final    Comment: Not Enterococcus species, Streptococcus agalactiae, Streptococcus pyogenes, or Streptococcus pneumoniae. CRITICAL RESULT CALLED TO, READ BACK BY AND VERIFIED WITH: JNARKLE,PHARMD @2115  05/25/17 BY  LHOWARD    Streptococcus agalactiae NOT DETECTED NOT DETECTED Final   Streptococcus pneumoniae NOT DETECTED NOT DETECTED Final   Streptococcus pyogenes NOT DETECTED NOT DETECTED Final   Acinetobacter baumannii NOT DETECTED NOT DETECTED Final   Enterobacteriaceae species NOT DETECTED NOT DETECTED Final   Enterobacter cloacae complex NOT DETECTED NOT DETECTED Final   Escherichia coli NOT DETECTED NOT DETECTED Final   Klebsiella oxytoca NOT DETECTED NOT DETECTED Final   Klebsiella pneumoniae NOT DETECTED NOT DETECTED Final   Proteus species NOT DETECTED NOT DETECTED Final   Serratia marcescens NOT DETECTED NOT DETECTED Final   Haemophilus influenzae NOT DETECTED NOT DETECTED Final   Neisseria meningitidis NOT DETECTED NOT DETECTED Final   Pseudomonas aeruginosa NOT DETECTED NOT DETECTED Final   Candida albicans NOT DETECTED NOT DETECTED Final   Candida glabrata NOT DETECTED NOT DETECTED Final   Candida krusei NOT DETECTED NOT DETECTED Final   Candida parapsilosis NOT DETECTED NOT DETECTED Final   Candida tropicalis NOT DETECTED NOT DETECTED Final  Culture, blood (Routine X 2) w Reflex to ID Panel     Status: None (Preliminary result)   Collection Time: 05/25/17  3:25 PM  Result Value Ref Range Status   Specimen Description BLOOD BLOOD RIGHT HAND  Final   Special Requests IN PEDIATRIC BOTTLE Blood Culture adequate volume  Final   Culture NO GROWTH 4 DAYS  Final   Report Status PENDING  Incomplete  Culture, blood (routine x 2)     Status: None (Preliminary result)   Collection Time: 05/26/17 12:20 AM  Result Value Ref Range Status   Specimen Description BLOOD LEFT ANTECUBITAL  Final   Special Requests   Final    BOTTLES DRAWN AEROBIC AND ANAEROBIC Blood Culture adequate volume   Culture NO  GROWTH 3 DAYS  Final   Report Status PENDING  Incomplete  Culture, blood (routine x 2)     Status: None (Preliminary result)   Collection Time: 05/26/17 12:22 AM  Result Value Ref Range Status    Specimen Description BLOOD LEFT HAND  Final   Special Requests   Final    BOTTLES DRAWN AEROBIC ONLY Blood Culture adequate volume   Culture NO GROWTH 3 DAYS  Final   Report Status PENDING  Incomplete    Studies/Results: Dg Esophagus  Result Date: 05/30/2017 CLINICAL DATA:  Chest pain. EXAM: ESOPHOGRAM / BARIUM SWALLOW / BARIUM TABLET STUDY TECHNIQUE: Combined double contrast and single contrast examination performed using effervescent crystals, thick barium liquid, and thin barium liquid. The patient was observed with fluoroscopy swallowing a 13 mm barium sulphate tablet. FLUOROSCOPY TIME:  Fluoroscopy Time:  1 minutes 0 seconds COMPARISON:  None. FINDINGS: The mucosa and motility of the esophagus are normal. There is no hiatal hernia, stricture, mass, or esophagitis. A 13 mm barium tablet passed immediately from the mouth to the stomach with no delay. IMPRESSION: Normal barium esophagram. Electronically Signed   By: Lorriane Shire M.D.   On: 05/30/2017 08:40   Ct Coronary Morph W/cta Cor W/score W/ca W/cm &/or Wo/cm  Addendum Date: 05/29/2017   ADDENDUM REPORT: 05/29/2017 17:40 CLINICAL DATA:  69 -year-old female with aortic valve endocarditis. EXAM: Cardiac/Coronary  CT TECHNIQUE: The patient was scanned on a [employer]. FINDINGS: A 120 kV prospective scan was triggered in the descending thoracic aorta at 111 HU's. Axial non-contrast 3 mm slices were carried out through the heart. The data set was analyzed on a dedicated work station and scored using the [place]. Gantry rotation speed was 250 msecs and collimation was .6 mm. 15 mg of iv Metoprolol and 0.8 mg of sl NTG was given. The 3D data set was reconstructed in 5% intervals of the 67-82 % of the R-R cycle. Diastolic phases were analyzed on a dedicated work station using MPR, MIP and VRT modes. The patient received 80 cc of contrast. Aorta:  Normal size.  No calcifications.  No dissection. Aortic Valve: Trileaflet. Thickening  and vegetations seen on the left and non-coronary cusps. Coronary Arteries:  Normal coronary origin.  Right dominance. RCA is a large dominant artery that gives rise to PDA and a small PLA. There is no plaque. Left main is a large and long artery that gives rise to LAD and LCX arteries. Ther eis no plaque. LAD is a large vessel that gives rise to a small diagonal branch. There is motion in the proximal LAD however no plaque is seen. LCX is a non-dominant artery that gives rise to one OM1 branch. There is no plaque. Other findings: Normal pulmonary vein drainage into the left atrium. Normal let atrial appendage without a thrombus. No ASD or VSD is seen. Mildly dilated pulmonary artery measuring 32 mm. IMPRESSION: 1. Coronary calcium score of 0. This was 0 percentile for age and sex matched control. 2. Normal coronary origin with right dominance. 3. There is motion in the proximal LAD, however no plaque is seen. There is no evidence of CAD. 4. Mildly dilated pulmonary artery measuring 32 mm. 5. Aortic valve is trileaflet, thickened, with vegetations seen on the left and non-coronary cusps. Electronically Signed   By: Ena Dawley   On: 05/29/2017 17:40   Result Date: 05/29/2017 EXAM: OVER-READ INTERPRETATION  CT CHEST The following report is an over-read performed by radiologist Dr.  [facility] on 05/29/2017. This over-read does not include interpretation of cardiac or coronary anatomy or pathology. The coronary CTA interpretation by the cardiologist is attached. COMPARISON:  05/24/2017 chest CTA. FINDINGS: Vascular: Normal aortic caliber, without dissection. No central pulmonary embolism, on this non-dedicated study. Mediastinum/Nodes: No imaged thoracic adenopathy. Lungs/Pleura: No pleural fluid. Lingular atelectasis. 4 mm right middle lobe pulmonary nodule on image 18/series 13. Upper Abdomen: Normal imaged portions of the liver, spleen, stomach, adrenal glands, left kidney.  Musculoskeletal: No acute osseous abnormality. IMPRESSION: 1.  No acute findings in the imaged extracardiac chest. 2. Isolated 4 mm right middle lobe pulmonary nodule. No follow-up needed if patient is low-risk. Non-contrast chest CT can be considered in 12 months if patient is high-risk. This recommendation follows the consensus statement: Guidelines for Management of Incidental Pulmonary Nodules Detected on CT Images: From the Fleischner Society 2017; Radiology 2017; 284:228-243. Electronically Signed: By: Abigail Miyamoto M.D. On: 05/29/2017 16:57      Assessment/Plan: INTERVAL HISTORY:  Pt had normal barium swallow and normal coronaries on CTA   Principal Problem:   Aortic valve endocarditis Active Problems:   Sepsis ([place])   Bacteremia due to Streptococcus   Diastolic murmur   Bacterial vaginosis   Atypical chest pain   Temporal headache   Streptococcal sepsis ([place])   Right-sided chest pain    Bernette Seeman is a 41 y.o. 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 Could be esophageal spasm. Will increas her azole to 200mg  daily in case this is candida esophagitis      LOS: 4 days   Alcide Evener 05/30/2017, 2:12 PM

## 2017-05-30 NOTE — Progress Notes (Signed)
Progress Note  Patient Name: Krystal Shaffer Date of Encounter: 05/30/2017  Primary Cardiologist: NA  Subjective   Pt doing well this AM. Does have mild, lingering headache.  Inpatient Medications    Scheduled Meds: . enoxaparin (LOVENOX) injection  40 mg Subcutaneous Q24H  . fluconazole  100 mg Oral Daily  . metoprolol tartrate  25 mg Oral BID  . pantoprazole  40 mg Oral BID AC  . sucralfate  1 g Oral TID PC & HS   Continuous Infusions: . cefTRIAXone (ROCEPHIN)  IV Stopped (05/29/17 2231)  . gentamicin Stopped (05/29/17 1808)   PRN Meds: acetaminophen **OR** acetaminophen, gi cocktail, metoprolol tartrate, morphine injection, nitroGLYCERIN, ondansetron **OR** ondansetron (ZOFRAN) IV, polyethylene glycol, traZODone   Vital Signs    Vitals:   05/29/17 1332 05/29/17 1600 05/29/17 2100 05/30/17 0624  BP: (!) 162/52 (!) 125/56 (!) 132/56 (!) 127/50  Pulse: 91 80 84 89  Resp: 18 18 17 17   Temp: 97.8 F (36.6 C)  98 F (36.7 C) 98.2 F (36.8 C)  TempSrc: Oral     SpO2: 100% 99% 100% 97%  Weight:      Height:        Intake/Output Summary (Last 24 hours) at 05/30/2017 1211 Last data filed at 05/30/2017 1038 Gross per 24 hour  Intake 225 ml  Output -  Net 225 ml   Filed Weights   05/24/17 1405 05/29/17 0730  Weight: 150 lb (68 kg) 150 lb (68 kg)    Physical Exam   General: Well developed, well nourished, NAD Skin: Warm, dry, intact  Head: Normocephalic, atraumatic, sclera non-icteric, no xanthomas, clear, moist mucus membranes. Neck: Negative for carotid bruits. No JVD Lungs:Clear to ausculation bilaterally. No wheezes, rales, or rhonchi. Breathing is unlabored. Cardiovascular: RRR with S1 S2. No murmurs, rubs, gallops, or LV heave appreciated. Abdomen: Soft, non-tender, non-distended with normoactive bowel sounds. No hepatomegaly, No rebound/guarding. No obvious abdominal masses. MSK: Strength and tone appear normal for age. 5/5 in all  extremities Extremities: No edema. No clubbing or cyanosis. DP/PT pulses 2+ bilaterally Neuro: Alert and oriented. No focal deficits. No facial asymmetry. MAE spontaneously. Psych: Responds to questions appropriately with normal affect.     Labs    Chemistry Recent Labs  Lab 05/24/17 1433 05/25/17 0536 05/30/17 0424  NA 133* 134* 134*  K 3.5 3.7 4.1  CL 103 105 102  CO2 22 24 23   GLUCOSE 97 91 82  BUN 7 7 9   CREATININE 0.63 0.60 0.79  CALCIUM 8.7* 7.8* 8.6*  PROT 7.4  --   --   ALBUMIN 2.8*  --   --   AST 15  --   --   ALT 10*  --   --   ALKPHOS 64  --   --   BILITOT 0.5  --   --   GFRNONAA >60 >60 >60  GFRAA >60 >60 >60  ANIONGAP 8 5 9      Hematology Recent Labs  Lab 05/24/17 1433 05/25/17 0034 05/25/17 0536  WBC 12.3*  --  6.8  RBC 3.93 3.39* 3.47*  HGB 10.1*  --  8.8*  HCT 32.2* 27.9* 28.7*  MCV 81.9  --  82.7  MCH 25.7*  --  25.4*  MCHC 31.4  --  30.7  RDW 15.1  --  15.2  PLT 298  --  254    Cardiac Enzymes Recent Labs  Lab 05/28/17 1154  TROPONINI <0.03    Recent Labs  Lab 05/24/17 1450 05/24/17 2258  TROPIPOC 0.00 0.00     BNPNo results for input(s): BNP, PROBNP in the last 168 hours.   DDimer  Recent Labs  Lab 05/24/17 1731  DDIMER 0.51*     Radiology    Dg Esophagus  Result Date: 05/30/2017 CLINICAL DATA:  Chest pain. EXAM: ESOPHOGRAM / BARIUM SWALLOW / BARIUM TABLET STUDY TECHNIQUE: Combined double contrast and single contrast examination performed using effervescent crystals, thick barium liquid, and thin barium liquid. The patient was observed with fluoroscopy swallowing a 13 mm barium sulphate tablet. FLUOROSCOPY TIME:  Fluoroscopy Time:  1 minutes 0 seconds COMPARISON:  None. FINDINGS: The mucosa and motility of the esophagus are normal. There is no hiatal hernia, stricture, mass, or esophagitis. A 13 mm barium tablet passed immediately from the mouth to the stomach with no delay. IMPRESSION: Normal barium esophagram.  Electronically Signed   By: Lorriane Shire M.D.   On: 05/30/2017 08:40   Ct Coronary Morph W/cta Cor W/score W/ca W/cm &/or Wo/cm  Addendum Date: 05/29/2017   ADDENDUM REPORT: 05/29/2017 17:40 CLINICAL DATA:  41 -year-old female with aortic valve endocarditis. EXAM: Cardiac/Coronary  CT TECHNIQUE: The patient was scanned on a [employer]. FINDINGS: A 120 kV prospective scan was triggered in the descending thoracic aorta at 111 HU's. Axial non-contrast 3 mm slices were carried out through the heart. The data set was analyzed on a dedicated work station and scored using the [place]. Gantry rotation speed was 250 msecs and collimation was .6 mm. 15 mg of iv Metoprolol and 0.8 mg of sl NTG was given. The 3D data set was reconstructed in 5% intervals of the 67-82 % of the R-R cycle. Diastolic phases were analyzed on a dedicated work station using MPR, MIP and VRT modes. The patient received 80 cc of contrast. Aorta:  Normal size.  No calcifications.  No dissection. Aortic Valve: Trileaflet. Thickening and vegetations seen on the left and non-coronary cusps. Coronary Arteries:  Normal coronary origin.  Right dominance. RCA is a large dominant artery that gives rise to PDA and a small PLA. There is no plaque. Left main is a large and long artery that gives rise to LAD and LCX arteries. Ther eis no plaque. LAD is a large vessel that gives rise to a small diagonal branch. There is motion in the proximal LAD however no plaque is seen. LCX is a non-dominant artery that gives rise to one OM1 branch. There is no plaque. Other findings: Normal pulmonary vein drainage into the left atrium. Normal let atrial appendage without a thrombus. No ASD or VSD is seen. Mildly dilated pulmonary artery measuring 32 mm. IMPRESSION: 1. Coronary calcium score of 0. This was 0 percentile for age and sex matched control. 2. Normal coronary origin with right dominance. 3. There is motion in the proximal LAD, however no plaque  is seen. There is no evidence of CAD. 4. Mildly dilated pulmonary artery measuring 32 mm. 5. Aortic valve is trileaflet, thickened, with vegetations seen on the left and non-coronary cusps. Electronically Signed   By: Ena Dawley   On: 05/29/2017 17:40   Result Date: 05/29/2017 EXAM: OVER-READ INTERPRETATION  CT CHEST The following report is an over-read performed by radiologist Dr. Forest Gleason [facility] on 05/29/2017. This over-read does not include interpretation of cardiac or coronary anatomy or pathology. The coronary CTA interpretation by the cardiologist is attached. COMPARISON:  05/24/2017 chest CTA. FINDINGS: Vascular: Normal aortic caliber, without dissection.  No central pulmonary embolism, on this non-dedicated study. Mediastinum/Nodes: No imaged thoracic adenopathy. Lungs/Pleura: No pleural fluid. Lingular atelectasis. 4 mm right middle lobe pulmonary nodule on image 18/series 13. Upper Abdomen: Normal imaged portions of the liver, spleen, stomach, adrenal glands, left kidney. Musculoskeletal: No acute osseous abnormality. IMPRESSION: 1.  No acute findings in the imaged extracardiac chest. 2. Isolated 4 mm right middle lobe pulmonary nodule. No follow-up needed if patient is low-risk. Non-contrast chest CT can be considered in 12 months if patient is high-risk. This recommendation follows the consensus statement: Guidelines for Management of Incidental Pulmonary Nodules Detected on CT Images: From the Fleischner Society 2017; Radiology 2017; 284:228-243. Electronically Signed: By: Abigail Miyamoto M.D. On: 05/29/2017 16:57    Telemetry    05/30/17-NSR rate 92- Personally Reviewed  ECG    05/26/17-NSR 97- Personally Reviewed  Cardiac Studies   Echo TEE 05/29/17: Study Conclusions  - Left ventricle: The cavity size was normal. Wall thickness was   normal. Systolic function was normal. The estimated ejection   fraction was in the range of 60% to 65%. - Aortic valve:  Functionally bicuspid; moderately thickened   leaflets. There was a vegetation. There was a large, 1.4 cm (L) x   0.7 cm (W), fixed vegetation on the noncoronary cusp. Another   vegetation is present at the tip of the fusion point of right and   left coronary cusp, 0.7 x 0.5cm. There was severe regurgitation. - Mitral valve: No evidence of vegetation. There was mild   regurgitation. - Left atrium: The atrium was dilated. No evidence of thrombus in   the appendage. - Right atrium: No evidence of thrombus in the atrial cavity or   appendage. - Atrial septum: No defect or patent foramen ovale was identified. - Tricuspid valve: No evidence of vegetation. - Pulmonic valve: No evidence of vegetation. - Superior vena cava: The study excluded a thrombus. - Pericardium, extracardiac: A trivial pericardial effusion was   identified.  Coronary CT: 05/29/17 IMPRESSION: 1. Coronary calcium score of 0. This was 0 percentile for age and sex matched control.  2. Normal coronary origin with right dominance.  3. There is motion in the proximal LAD, however no plaque is seen. There is no evidence of CAD.  4. Mildly dilated pulmonary artery measuring 32 mm.  5. Aortic valve is trileaflet, thickened, with vegetations seen on the left and non-coronary cusps.  ECHO: 05/25/2017 Study Conclusions  - Left ventricle: The cavity size was normal. Systolic function was normal. The estimated ejection fraction was in the range of 60% to 65%. Wall motion was normal; there were no regional wall motion abnormalities. Doppler parameters are consistent with abnormal left ventricular relaxation (grade 1 diastolic dysfunction). - Aortic valve: Trileaflet; moderately thickened leaflets with 1cm x 1cm mass on the right coronary cusp, mild mobility, suggestive of vegetation. If no signs of infection, consider fibroelastoma. There was severe stenosis. Valve area (VTI): 1.32 cm^2.  Valve area (Vmax): 1.41 cm^2. Valve area (Vmean): 1.28 cm^2.   Patient Profile     41 y.o. female with a hx of migraines and recurrent UTI who is being seen today for the evaluation for cardiac clearance for aortic valve surgery s/p endocarditis at the request of Dr. Prescott Gum.    Assessment & Plan    1. Endocarditis/ pre-surgical clearance: -Echo 05/25/2017 revealed aortic insufficiency with large vegetation on the right coronary leaflet of the aortic valve with severe aortic insufficiency and adequate LV function.  EF 60-65% -  TEE echo completed 05/29/2017 as a part of presurgical clearance and evaluation.  Results as above -Cardiac CT completed 05/29/2017 to evaluate for coronary artery disease.  This has been reviewed she has normal coronary arteries without CAD. Okay to proceed with AVR from cardiology standpoint -She is scheduled for dental extraction related to dental abscess confirmed per [facility] x-ray this admission prior to her AVR surgery  2.  Hypertension: -She remains hypertensive and tachycardic -Patient remains on Lopressor 25 mg p.o. twice daily -We will continue to monitor  3.  Atypical chest pain: -On empiric PPI twice daily, Carafate, and Diflucan -Patient had a esophagram today 05/30/2017 which showed normal esophagus, no hiatal hernia, stricture, mass, or esophagitis. -Given negative coronary CT, not likely that this is cardiac in nature  4. Headache: -Pt has history of migraines -She has been hydrating well, minimal sleep   Signed, Kathyrn Drown, NP  05/30/2017, 12:11 PM    For questions or updates, please contact   Please consult [email or web address] for contact info under Cardiology/STEMI.

## 2017-05-30 NOTE — Progress Notes (Signed)
Pt c/o chest pain. VS= 187/76 P=104 O2=100%. Nitro x 1 given. Morphine 2mg  given. On call made aware. Chest pain resolved after giving meds. VSS. BP=130/60 P=89 O2=100% on RA RR=20. Will continue to monitor pt.

## 2017-05-31 ENCOUNTER — Encounter (HOSPITAL_COMMUNITY)
Admission: EM | Disposition: A | Discharge status: Home Health | Payer: Self-pay | Source: Home / Self Care | Attending: Cardiothoracic Surgery

## 2017-05-31 ENCOUNTER — Encounter (HOSPITAL_COMMUNITY): Payer: Self-pay | Admitting: Anesthesiology

## 2017-05-31 ENCOUNTER — Inpatient Hospital Stay (HOSPITAL_COMMUNITY): Payer: 59 | Admitting: Certified Registered Nurse Anesthetist

## 2017-05-31 HISTORY — PX: MULTIPLE EXTRACTIONS WITH ALVEOLOPLASTY: SHX5342

## 2017-05-31 LAB — CULTURE, BLOOD (ROUTINE X 2)
CULTURE: NO GROWTH
Culture: NO GROWTH
SPECIAL REQUESTS: ADEQUATE
SPECIAL REQUESTS: ADEQUATE

## 2017-05-31 LAB — SURGICAL PCR SCREEN
MRSA, PCR: NEGATIVE
Staphylococcus aureus: NEGATIVE

## 2017-05-31 LAB — GENTAMICIN LEVEL, TROUGH

## 2017-05-31 SURGERY — MULTIPLE EXTRACTION WITH ALVEOLOPLASTY
Anesthesia: General

## 2017-05-31 MED ORDER — 0.9 % SODIUM CHLORIDE (POUR BTL) OPTIME
TOPICAL | Status: DC | PRN
  Administered 2017-05-31: 1000 mL

## 2017-05-31 MED ORDER — BUPIVACAINE-EPINEPHRINE (PF) 0.5% -1:200000 IJ SOLN
INTRAMUSCULAR | Status: AC
  Filled 2017-05-31: qty 3.6

## 2017-05-31 MED ORDER — LACTATED RINGERS IV SOLN
INTRAVENOUS | Status: DC | PRN
  Administered 2017-05-31: 10:00:00 via INTRAVENOUS

## 2017-05-31 MED ORDER — FENTANYL CITRATE (PF) 100 MCG/2ML IJ SOLN: 25.0000 ug | INTRAMUSCULAR | Status: DC | PRN

## 2017-05-31 MED ORDER — LIDOCAINE-EPINEPHRINE 2 %-1:100000 IJ SOLN
INTRAMUSCULAR | Status: DC | PRN
  Administered 2017-05-31: 1.7 mL via INTRADERMAL

## 2017-05-31 MED ORDER — EPHEDRINE SULFATE 50 MG/ML IJ SOLN
INTRAMUSCULAR | Status: DC | PRN
  Administered 2017-05-31 (×2): 5 mg via INTRAVENOUS

## 2017-05-31 MED ORDER — LIDOCAINE-EPINEPHRINE 2 %-1:100000 IJ SOLN
INTRAMUSCULAR | Status: AC
  Filled 2017-05-31: qty 10.2

## 2017-05-31 MED ORDER — ONDANSETRON HCL 4 MG/2ML IJ SOLN
INTRAMUSCULAR | Status: DC | PRN
  Administered 2017-05-31: 4 mg via INTRAVENOUS

## 2017-05-31 MED ORDER — MEPERIDINE HCL 25 MG/ML IJ SOLN: 6.2500 mg | INTRAMUSCULAR | Status: DC | PRN

## 2017-05-31 MED ORDER — LACTATED RINGERS IV SOLN: INTRAVENOUS | Status: DC

## 2017-05-31 MED ORDER — FENTANYL CITRATE (PF) 250 MCG/5ML IJ SOLN
INTRAMUSCULAR | Status: AC
  Filled 2017-05-31: qty 5

## 2017-05-31 MED ORDER — FENTANYL CITRATE (PF) 100 MCG/2ML IJ SOLN
INTRAMUSCULAR | Status: DC | PRN
  Administered 2017-05-31: 50 ug via INTRAVENOUS
  Administered 2017-05-31: 100 ug via INTRAVENOUS

## 2017-05-31 MED ORDER — BUPIVACAINE-EPINEPHRINE 0.5% -1:200000 IJ SOLN: INTRAMUSCULAR | Status: DC | PRN

## 2017-05-31 MED ORDER — OXYMETAZOLINE HCL 0.05 % NA SOLN
NASAL | Status: DC | PRN
  Administered 2017-05-31: 1

## 2017-05-31 MED ORDER — MIDAZOLAM HCL 5 MG/5ML IJ SOLN
INTRAMUSCULAR | Status: DC | PRN
  Administered 2017-05-31: 2 mg via INTRAVENOUS

## 2017-05-31 MED ORDER — DEXAMETHASONE SODIUM PHOSPHATE 10 MG/ML IJ SOLN
INTRAMUSCULAR | Status: DC | PRN
  Administered 2017-05-31: 10 mg via INTRAVENOUS

## 2017-05-31 MED ORDER — HYDROCODONE-ACETAMINOPHEN 5-325 MG PO TABS
1.0000 | ORAL_TABLET | Freq: Four times a day (QID) | ORAL | Status: AC | PRN
  Administered 2017-05-31: 1 via ORAL
  Administered 2017-06-01: 2 via ORAL
  Filled 2017-05-31: qty 2
  Filled 2017-05-31: qty 1

## 2017-05-31 MED ORDER — MIDAZOLAM HCL 2 MG/2ML IJ SOLN
INTRAMUSCULAR | Status: AC
  Filled 2017-05-31: qty 2

## 2017-05-31 MED ORDER — LACTATED RINGERS IV SOLN
INTRAVENOUS | Status: DC
  Administered 2017-05-31: 10:00:00 via INTRAVENOUS

## 2017-05-31 MED ORDER — HEMOSTATIC AGENTS (NO CHARGE) OPTIME
TOPICAL | Status: DC | PRN
  Administered 2017-05-31: 1 via TOPICAL

## 2017-05-31 MED ORDER — PHENOL 1.4 % MT LIQD
1.0000 | OROMUCOSAL | Status: DC | PRN
  Administered 2017-05-31: 1 via OROMUCOSAL
  Filled 2017-05-31: qty 177

## 2017-05-31 MED ORDER — PROMETHAZINE HCL 25 MG/ML IJ SOLN: 6.2500 mg | INTRAMUSCULAR | Status: DC | PRN

## 2017-05-31 MED ORDER — LIDOCAINE HCL (CARDIAC) 20 MG/ML IV SOLN
INTRAVENOUS | Status: DC | PRN
  Administered 2017-05-31: 50 mg via INTRAVENOUS

## 2017-05-31 MED ORDER — ROCURONIUM BROMIDE 100 MG/10ML IV SOLN
INTRAVENOUS | Status: DC | PRN
  Administered 2017-05-31: 50 mg via INTRAVENOUS

## 2017-05-31 MED ORDER — PROPOFOL 10 MG/ML IV BOLUS
INTRAVENOUS | Status: DC | PRN
  Administered 2017-05-31: 100 mg via INTRAVENOUS
  Administered 2017-05-31: 20 mg via INTRAVENOUS

## 2017-05-31 MED ORDER — OXYMETAZOLINE HCL 0.05 % NA SOLN
NASAL | Status: AC
  Filled 2017-05-31: qty 15

## 2017-05-31 MED ORDER — STERILE WATER FOR IRRIGATION IR SOLN
Status: DC | PRN
  Administered 2017-05-31: 1000 mL

## 2017-05-31 MED ORDER — SUGAMMADEX SODIUM 200 MG/2ML IV SOLN
INTRAVENOUS | Status: DC | PRN
  Administered 2017-05-31: 200 mg via INTRAVENOUS

## 2017-05-31 SURGICAL SUPPLY — 36 items
ALCOHOL 70% 16 OZ (MISCELLANEOUS) ×2 IMPLANT
ATTRACTOMAT 16X20 MAGNETIC DRP (DRAPES) ×2 IMPLANT
BLADE SURG 15 STRL LF DISP TIS (BLADE) ×2 IMPLANT
BLADE SURG 15 STRL SS (BLADE) ×2
COVER SURGICAL LIGHT HANDLE (MISCELLANEOUS) ×2 IMPLANT
GAUZE PACKING FOLDED 2  STR (GAUZE/BANDAGES/DRESSINGS) ×1
GAUZE PACKING FOLDED 2 STR (GAUZE/BANDAGES/DRESSINGS) ×1 IMPLANT
GAUZE SPONGE 4X4 16PLY XRAY LF (GAUZE/BANDAGES/DRESSINGS) ×2 IMPLANT
GLOVE BIOGEL PI IND STRL 6 (GLOVE) ×1 IMPLANT
GLOVE BIOGEL PI INDICATOR 6 (GLOVE) ×1
GLOVE SURG ORTHO 8.0 STRL STRW (GLOVE) ×2 IMPLANT
GLOVE SURG SS PI 6.0 STRL IVOR (GLOVE) ×2 IMPLANT
GOWN STRL REUS W/ TWL LRG LVL3 (GOWN DISPOSABLE) ×1 IMPLANT
GOWN STRL REUS W/TWL 2XL LVL3 (GOWN DISPOSABLE) ×2 IMPLANT
GOWN STRL REUS W/TWL LRG LVL3 (GOWN DISPOSABLE) ×1
HEMOSTAT SURGICEL 2X14 (HEMOSTASIS) ×2 IMPLANT
KIT BASIN OR (CUSTOM PROCEDURE TRAY) ×2 IMPLANT
KIT ROOM TURNOVER OR (KITS) ×2 IMPLANT
MANIFOLD NEPTUNE WASTE (CANNULA) ×2 IMPLANT
NEEDLE BLUNT 16X1.5 OR ONLY (NEEDLE) ×2 IMPLANT
NS IRRIG 1000ML POUR BTL (IV SOLUTION) ×2 IMPLANT
PACK EENT II TURBAN DRAPE (CUSTOM PROCEDURE TRAY) ×2 IMPLANT
PAD ARMBOARD 7.5X6 YLW CONV (MISCELLANEOUS) ×2 IMPLANT
SPONGE SURGIFOAM ABS GEL 100 (HEMOSTASIS) IMPLANT
SPONGE SURGIFOAM ABS GEL 12-7 (HEMOSTASIS) IMPLANT
SPONGE SURGIFOAM ABS GEL SZ50 (HEMOSTASIS) ×2 IMPLANT
SUCTION FRAZIER HANDLE 10FR (MISCELLANEOUS) ×1
SUCTION TUBE FRAZIER 10FR DISP (MISCELLANEOUS) ×1 IMPLANT
SUT CHROMIC 3 0 PS 2 (SUTURE) ×4 IMPLANT
SUT CHROMIC 4 0 P 3 18 (SUTURE) IMPLANT
SYR 50ML SLIP (SYRINGE) ×2 IMPLANT
TOWEL OR 17X26 10 PK STRL BLUE (TOWEL DISPOSABLE) ×2 IMPLANT
TUBE CONNECTING 12X1/4 (SUCTIONS) ×2 IMPLANT
WATER STERILE IRR 1000ML POUR (IV SOLUTION) ×2 IMPLANT
WATER TABLETS ICX (MISCELLANEOUS) ×2 IMPLANT
YANKAUER SUCT BULB TIP NO VENT (SUCTIONS) ×2 IMPLANT

## 2017-05-31 NOTE — Progress Notes (Signed)
      INFECTIOUS DISEASE ATTENDING    Date: 05/31/2017  Patient name: [place] record number: [id number]  Date of birth: December 12, 1975    Patient is sp dental surgery by Dr. Enrique Sack  I am planning on giving her 4 weeks of IV ceftriaxone and 2 weeks of gentamicin AFTER her heart valve replacement  IV antibiotic plan is as follows:  Diagnosis: Infectious Endocarditis  Culture Result:VGS  No Known Allergies  OPAT Orders Discharge antibiotics: Ceftriaxone 2 grams daily x 4 weeks postop and Gentamicin IV x 2 weeks postop per pharmacy protocol   Duration: 4 weeks CTX 2 weeks Gent  End Date:  Ceftriaxone : January 20 th Gentamicin: January 6th  [place] Per Protocol:  While on CTX and on Gentamicin:  BIWEEKLY  _x_ BMP w GFR and  _x_  gentamicin levels per pharmacy _x_ CBC with differential  While on CTX alone  _x_ CBC with differential _x_ BMP   x__ Please pull PIC at completion of IV antibiotics __ Please leave PIC in place until doctor has seen patient or been notified  Fax weekly labs to [phone]  Clinic Follow Up Appt:  In next 3-4 weeks  I will sign off for now please call with further questions  Holiday schedule for call is  Dr. Johnnye Sima 22-23 Dr. Megan Salon 24-26 Dr. Baxter Flattery 27-29 Dr Tommy Medal 30-1st     Rhina Br[place] 05/31/2017, 5:54 PM

## 2017-05-31 NOTE — Transfer of Care (Signed)
Immediate Anesthesia Transfer of Care Note  Patient: Krystal Shaffer  Procedure(s) Performed: Extraction of tooth  #'s [phone] with alveoloplasty and gross debridement of remaining dentition (N/A )  Patient Location: PACU  Anesthesia Type:General  Level of Consciousness: awake, alert  and oriented  Airway & Oxygen Therapy: Patient Spontanous Breathing and Patient connected to face mask oxygen  Post-op Assessment: Report given to RN and Post -op Vital signs reviewed and stable  Post vital signs: Reviewed and stable  Last Vitals:  Vitals:   05/31/17 0904 05/31/17 1137  BP: 136/76   Pulse: 99   Resp: 16   Temp: 37.2 C 37.1 C  SpO2: 96%     Last Pain:  Vitals:   05/31/17 0904  TempSrc: Oral  PainSc:       Patients Stated Pain Goal: 2 (23/36/12 2449)  Complications: No apparent anesthesia complications

## 2017-05-31 NOTE — Progress Notes (Signed)
Pharmacy Antibiotic Note  Krystal Shaffer is a 41 y.o. female admitted on 05/24/2017 with fever, chills, myalgias and found to have strep bacteremia and aortic valve endocarditis. Pharmacy has been consulted for Rocephin and Gentamicin dosing.  Gentamicin trough obtained tonight <0.5, which is appropriate for synergy dosing.  Plan: Continue Ceftriaxone 2g q 24 hrs - planning x 4 weeks AFTER valve replacement Continue Gentamicin 3mg /kg q 24 hrs (= 200 mg q 24 hrs) - planning x 2 weeks AFTER valve replacement   Height: 5\' 4"  (162.6 cm) Weight: 150 lb (68 kg) IBW/kg (Calculated) : 54.7  Temp (24hrs), Avg:98.7 F (37.1 C), Min:98.4 F (36.9 C), Max:99.1 F (37.3 C)  Recent Labs  Lab 05/25/17 0536 05/30/17 0424 05/31/17 1648  WBC 6.8  --   --   CREATININE 0.60 0.79  --   GENTTROUGH  --   --  <0.5*    Estimated Creatinine Clearance: 88.5 mL/min (by C-G formula based on SCr of 0.79 mg/dL).    No Known Allergies  Antimicrobials this admission: Ceftriaxone 12/13>>  Gentamicin 12/17>>  Fluconazole (vaginosis) 12/16 >> (12/29)  Dose adjustments this admission: none  Microbiology results: 12/19 MRSA pcr: neg 12/15 BCx x 2: ngtd 12/14 BCx x 2: ngtd 12/13 BCx x 2: viridans streptococcus- I: PCN; S: CTX 12/13 UCx: neg  Thank you for allowing pharmacy to be a part of this patient's care.  [employer], Pharm.D., BCPS Clinical Pharmacist . 05/31/2017 8:36 PM

## 2017-05-31 NOTE — Progress Notes (Signed)
Spoke with RN re PICC will not be placed today.

## 2017-05-31 NOTE — Anesthesia Procedure Notes (Signed)
Procedure Name: Intubation Date/Time: 05/31/2017 10:56 AM Performed by: Gayland Curry, CRNA Pre-anesthesia Checklist: Patient identified, Emergency Drugs available, Suction available and Patient being monitored Patient Re-evaluated:Patient Re-evaluated prior to induction Oxygen Delivery Method: Circle system utilized Preoxygenation: Pre-oxygenation with 100% oxygen Induction Type: IV induction Ventilation: Mask ventilation without difficulty Laryngoscope Size: Mac and 3 Grade View: Grade I Tube type: Oral Tube size: 7.0 mm Number of attempts: 1 Placement Confirmation: ETT inserted through vocal cords under direct vision,  positive ETCO2 and breath sounds checked- equal and bilateral Secured at: 22 cm Tube secured with: Tape Dental Injury: Teeth and Oropharynx as per pre-operative assessment

## 2017-05-31 NOTE — Progress Notes (Signed)
PRE-OPERATIVE NOTE:  05/31/2017 Krystal Shaffer [id number]  VITALS: BP 136/76 (BP Location: Right Arm)   Pulse 99   Temp 98.9 F (37.2 C) (Oral)   Resp 16   Ht 5\' 4"  (1.626 m)   Wt 150 lb (68 kg)   LMP 05/25/2017   SpO2 96%   BMI 25.75 kg/m   Lab Results  Component Value Date   WBC 6.8 05/25/2017   HGB 8.8 (L) 05/25/2017   HCT 28.7 (L) 05/25/2017   MCV 82.7 05/25/2017   PLT 254 05/25/2017   BMET    Component Value Date/Time   NA 134 (L) 05/30/2017 0424   K 4.1 05/30/2017 0424   CL 102 05/30/2017 0424   CO2 23 05/30/2017 0424   GLUCOSE 82 05/30/2017 0424   BUN 9 05/30/2017 0424   CREATININE 0.79 05/30/2017 0424   CALCIUM 8.6 (L) 05/30/2017 0424   GFRNONAA >60 05/30/2017 0424   GFRAA >60 05/30/2017 0424    No results found for: INR, PROTIME No results found for: PTT   Krystal Shaffer presents for extraction of indicated teeth with alveoloplasty and gross debridement of remaining dentition the operating room where general anesthesia.   SUBJECTIVE: The patient denies any acute medical or dental changes and agrees to proceed with treatment as planned.  EXAM: No sign of acute dental changes.  ASSESSMENT: Patient is affected by multiple retained root segments, chronic apical periodontitis, chronic periodontitis, dental caries , and accretions.  PLAN: Patient agrees to proceed with treatment as planned in the operating room as previously discussed and accepts the risks, benefits, and complications of the proposed treatment. Patient is aware of the risk for bleeding, bruising, swelling, infection, pain, nerve damage, sinus involvement, root tip fracture, mandible fracture, and the risks of complications associated with the anesthesia. Patient also is aware of the potential for other complications not mentioned above.   Lenn Cal, DDS

## 2017-05-31 NOTE — Anesthesia Preprocedure Evaluation (Addendum)
Anesthesia Evaluation  Patient identified by MRN, date of birth, ID band Patient awake    Reviewed: Allergy & Precautions, NPO status , Patient's Chart, lab work & pertinent test results  Airway Mallampati: II  TM Distance: >3 FB Neck ROM: Full    Dental  (+) Teeth Intact, Dental Advisory Given, Poor Dentition   Pulmonary    breath sounds clear to auscultation       Cardiovascular negative cardio ROS   Rhythm:Regular Rate:Normal     Neuro/Psych  Headaches, negative psych ROS   GI/Hepatic negative GI ROS, Neg liver ROS,   Endo/Other  negative endocrine ROS  Renal/GU Renal disease  negative genitourinary   Musculoskeletal negative musculoskeletal ROS (+)   Abdominal Normal abdominal exam  (+)   Peds  Hematology negative hematology ROS (+)   Anesthesia Other Findings   Reproductive/Obstetrics negative OB ROS                            Lab Results  Component Value Date   WBC 6.8 05/25/2017   HGB 8.8 (L) 05/25/2017   HCT 28.7 (L) 05/25/2017   MCV 82.7 05/25/2017   PLT 254 05/25/2017   Lab Results  Component Value Date   CREATININE 0.79 05/30/2017   BUN 9 05/30/2017   NA 134 (L) 05/30/2017   K 4.1 05/30/2017   CL 102 05/30/2017   CO2 23 05/30/2017   No results found for: INR, PROTIME  Echo: - Left ventricle: The cavity size was normal. Wall thickness was   normal. Systolic function was normal. The estimated ejection   fraction was in the range of 60% to 65%. - Aortic valve: Functionally bicuspid; moderately thickened   leaflets. There was a vegetation. There was a large, 1.4 cm (L) x   0.7 cm (W), fixed vegetation on the noncoronary cusp. Another   vegetation is present at the tip of the fusion point of right and   left coronary cusp, 0.7 x 0.5cm. There was severe regurgitation. - Mitral valve: No evidence of vegetation. There was mild   regurgitation. - Left atrium: The  atrium was dilated. No evidence of thrombus in   the appendage. - Right atrium: No evidence of thrombus in the atrial cavity or   appendage. - Atrial septum: No defect or patent foramen ovale was identified. - Tricuspid valve: No evidence of vegetation. - Pulmonic valve: No evidence of vegetation. - Superior vena cava: The study excluded a thrombus. - Pericardium, extracardiac: A trivial pericardial effusion was   identified.  Anesthesia Physical Anesthesia Plan  ASA: IV  Anesthesia Plan: General   Post-op Pain Management:    Induction: Intravenous  PONV Risk Score and Plan: 4 or greater and Ondansetron, Dexamethasone, Midazolam and Scopolamine patch - Pre-op  Airway Management Planned: Oral ETT  Additional Equipment: None  Intra-op Plan:   Post-operative Plan: Extubation in OR  Informed Consent: I have reviewed the patients History and Physical, chart, labs and discussed the procedure including the risks, benefits and alternatives for the proposed anesthesia with the patient or authorized representative who has indicated his/her understanding and acceptance.   Dental advisory given  Plan Discussed with: CRNA  Anesthesia Plan Comments:        Anesthesia Quick Evaluation

## 2017-05-31 NOTE — Progress Notes (Signed)
PROGRESS NOTE    Shawnese Magner  KXF:[id number] DOB: [birth year] DOA: 05/24/2017 PCP: Sofie Rower, PA-C   Brief Narrative: Krystal Shaffer is a 41 y.o. female with medical history significant for recent E. coli UTI with failed outpatient management requiring short inpatient stay for antibiotic therapy who presents to the ED with continued fever, chills, night sweats, myalgias. She presented with sepsis concerning for endocarditis with new murmur.   Assessment & Plan:   Principal Problem:   Aortic valve endocarditis Active Problems:   Sepsis ([place])   Bacteremia due to Streptococcus   Diastolic murmur   Bacterial vaginosis   Atypical chest pain   Temporal headache   Streptococcal sepsis ([facility])   Right-sided chest pain   Sepsis Streptococcal viridans bacteremia -BCID positive for strep. 2/2 cultures (12/13) positive.  -continue ceftriaxone and gentamicin  -will follow ID and CVTS rec's -Dr. Enrique Sack planning to perform dental extractions later today -cardiology consulted for stratification prior to valve replacement; and work up demonstrated normal coronary artery and from their stand point clear to proceed with surgery w/o any further work up..  Aortic valve vegetation -New. With high concerns for underlying endocarditis. -TEE confirmed large vegetation  -Cardiothoracic surgery planning valve replacement pending cardiac coronary workup -Dr. Enrique Sack planning dental extractions in preparation for surgery -cardiology has done coronary CT and results demonstrated no abnormalities -patient is good to proceed with surgery from cardiology stand point   History of UTI/Flank pain -Patient with under-treated UTI secondary to non-adherence secondary to nausea and vomiting prior to admission.  -Urine culture with no growth -patient is now denying dysuria and has remained afebrile. -currently receiving rocephin for her endocarditis (which empirically with also treat underlying  UTI)  Bacterial vaginosis -treated with flagyl  -no dysuria or vaginal discharge reported   Normocytic anemia -Iron studies in line with anemia of chronic disease -will monitor Hgb trend   Headache -Patient reports improvement/resolution of her migraine with the use of Depakote. -Continue PRN fioricet   Atypical chest pain -Most likely associated reflux and/or dysmotility.  -Following GI recommendations plan is for esophageal barium testing  -Continue as needed GI cocktail, PPI twice a day and analgesics. -Also empirically started on Diflucan. -Troponins. No ischemic changes on EKG and reassuring echocardiogram. -coronary CT has rule out ischemic cause for her discomfort.  -improved overall  DVT prophylaxis: Lovenox Code Status: Full code Family Communication: None at bedside Disposition Plan: Remains inpatient. Planning to have dental extractions later today and subsequent AVR. Continue IV antibiotics and follow further ID/CVTS rec's.  Consultants:   ID  Cardiology  Dental surgeon  CVTS  Procedures:   Echocardiogram (12/14) - Left ventricle: The cavity size was normal. Systolic function was   normal. The estimated ejection fraction was in the range of 60%   to 65%. Wall motion was normal; there were no regional wall   motion abnormalities. Doppler parameters are consistent with   abnormal left ventricular relaxation (grade 1 diastolic   dysfunction). - Aortic valve: Trileaflet; moderately thickened leaflets with 1cm   x 1cm mass on the right coronary cusp, mild mobility, suggestive   of vegetation. If no signs of infection, consider fibroelastoma.   There was severe stenosis. Valve area (VTI): 1.32 cm^2. Valve   area (Vmax): 1.41 cm^2. Valve area (Vmean): 1.28 cm^2.   TEE (12/18) - Left ventricle: The cavity size was normal. Wall thickness was   normal. Systolic function was normal. The estimated ejection   fraction was in the range of  60% to 65%. - Aortic  valve: Functionally bicuspid; moderately thickened   leaflets. There was a vegetation. There was a large, 1.4 cm (L) x   0.7 cm (W), fixed vegetation on the noncoronary cusp. Another   vegetation is present at the tip of the fusion point of right and   left coronary cusp, 0.7 x 0.5cm. There was severe regurgitation. - Mitral valve: No evidence of vegetation. There was mild   regurgitation. - Left atrium: The atrium was dilated. No evidence of thrombus in   the appendage. - Right atrium: No evidence of thrombus in the atrial cavity or   appendage. - Atrial septum: No defect or patent foramen ovale was identified. - Tricuspid valve: No evidence of vegetation. - Pulmonic valve: No evidence of vegetation. - Superior vena cava: The study excluded a thrombus. - Pericardium, extracardiac: A trivial pericardial effusion was   identified.   Multiple dental extractions anticipated later today 12/20  Antimicrobials:  Ceftriaxone  Gentamicin   Diflucan    Subjective: Afebrile, no nausea, no vomiting no shortness of breath.  Patient continued to experience intermittent episode of chest pain (especially overnight), which improved with the use of nitroglycerin and morphine.  No ischemic changes appreciated on performed  EKG overnight.    Objective: Vitals:   05/31/17 1137 05/31/17 1145 05/31/17 1200 05/31/17 1218  BP: (!) 126/52 (!) 113/52 (!) 120/55 (!) 131/51  Pulse: 94 83 86 90  Resp: (!) 22 15 20 16   Temp: 98.7 F (37.1 C)   98.6 F (37 C)  TempSrc:    Axillary  SpO2: 99% 98% 94% 96%  Weight:      Height:        Intake/Output Summary (Last 24 hours) at 05/31/2017 1609 Last data filed at 05/31/2017 1124 Gross per 24 hour  Intake 770 ml  Output 20 ml  Net 750 ml   Filed Weights   05/24/17 1405 05/29/17 0730  Weight: 68 kg (150 lb) 68 kg (150 lb)    Examination: General exam: No fever, no nausea, no vomiting.  Patient reports improvement in her migraine symptoms with  the use of Depakote.  Continued to have intermittent episode of chest discomfort throughout the night (relieved by the use of morphine and nitroglycerin).   Respiratory system: Clear to auscultation bilaterally; normal respiratory effort.     Cardiovascular system: Positive systolic ejection murmur, regular rate and rhythm, no JVD, no rubs, no gallops. Gastrointestinal system: Soft, nontender, nondistended, positive bowel sounds. Central nervous system/psychiatry: Cranial nerves intact, no focal neurologic or motor deficit on exam. Normal judgment and insight. Extremities: No edema, no cyanosis, no clubbing.   Skin: Intact, no petechiae, no bruises, no rashes, no open ulcers.   Data Reviewed: I have personally reviewed following labs and imaging studies  CBC: Recent Labs  Lab 05/25/17 0034 05/25/17 0536  WBC  --  6.8  NEUTROABS 5.4  --   HGB  --  8.8*  HCT 27.9* 28.7*  MCV  --  82.7  PLT  --  034   Basic Metabolic Panel: Recent Labs  Lab 05/25/17 0034 05/25/17 0536 05/30/17 0424  NA  --  134* 134*  K  --  3.7 4.1  CL  --  105 102  CO2  --  24 23  GLUCOSE  --  91 82  BUN  --  7 9  CREATININE  --  0.60 0.79  CALCIUM  --  7.8* 8.6*  MG 1.6*  --   --  PHOS 3.1  --   --    GFR: Estimated Creatinine Clearance: 88.5 mL/min (by C-G formula based on SCr of 0.79 mg/dL).   Cardiac Enzymes: Recent Labs  Lab 05/28/17 1154  TROPONINI <0.03   Sepsis Labs: Recent Labs  Lab 05/24/17 1811  LATICACIDVEN 1.18    Recent Results (from the past 240 hour(s))  Urine culture     Status: None   Collection Time: 05/24/17  6:06 PM  Result Value Ref Range Status   Specimen Description URINE, RANDOM  Final   Special Requests NONE  Final   Culture NO GROWTH  Final   Report Status 05/26/2017 FINAL  Final  Culture, blood (routine x 2)     Status: Abnormal   Collection Time: 05/24/17 11:05 PM  Result Value Ref Range Status   Specimen Description BLOOD RIGHT ANTECUBITAL  Final    Special Requests   Final    BOTTLES DRAWN AEROBIC AND ANAEROBIC Blood Culture adequate volume   Culture  Setup Time   Final    GRAM POSITIVE COCCI IN CHAINS IN BOTH AEROBIC AND ANAEROBIC BOTTLES CRITICAL VALUE NOTED.  VALUE IS CONSISTENT WITH PREVIOUSLY REPORTED AND CALLED VALUE.    Culture (A)  Final    VIRIDANS STREPTOCOCCUS SUSCEPTIBILITIES PERFORMED ON PREVIOUS CULTURE WITHIN THE LAST 5 DAYS.    Report Status 05/27/2017 FINAL  Final  Culture, blood (routine x 2)     Status: Abnormal   Collection Time: 05/24/17 11:14 PM  Result Value Ref Range Status   Specimen Description BLOOD LEFT WRIST  Final   Special Requests   Final    BOTTLES DRAWN AEROBIC AND ANAEROBIC Blood Culture adequate volume   Culture  Setup Time   Final    GRAM POSITIVE COCCI IN CHAINS IN BOTH AEROBIC AND ANAEROBIC BOTTLES CRITICAL RESULT CALLED TO, READ BACK BY AND VERIFIED WITH: JNARKLE,PHARMD @2115  05/25/17 BY LHOWARD    Culture VIRIDANS STREPTOCOCCUS (A)  Final   Report Status 05/27/2017 FINAL  Final   Organism ID, Bacteria VIRIDANS STREPTOCOCCUS  Final      Susceptibility   Viridans streptococcus - MIC*    PENICILLIN 1 INTERMEDIATE Intermediate     CEFTRIAXONE 1 SENSITIVE Sensitive     ERYTHROMYCIN <=0.12 SENSITIVE Sensitive     LEVOFLOXACIN 1 SENSITIVE Sensitive     VANCOMYCIN 0.5 SENSITIVE Sensitive     * VIRIDANS STREPTOCOCCUS  Blood Culture ID Panel (Reflexed)     Status: Abnormal   Collection Time: 05/24/17 11:14 PM  Result Value Ref Range Status   Enterococcus species NOT DETECTED NOT DETECTED Final   Listeria monocytogenes NOT DETECTED NOT DETECTED Final   Staphylococcus species NOT DETECTED NOT DETECTED Final   Staphylococcus aureus NOT DETECTED NOT DETECTED Final   Streptococcus species DETECTED (A) NOT DETECTED Final    Comment: Not Enterococcus species, Streptococcus agalactiae, Streptococcus pyogenes, or Streptococcus pneumoniae. CRITICAL RESULT CALLED TO, READ BACK BY AND VERIFIED  WITH: JNARKLE,PHARMD @2115  05/25/17 BY LHOWARD    Streptococcus agalactiae NOT DETECTED NOT DETECTED Final   Streptococcus pneumoniae NOT DETECTED NOT DETECTED Final   Streptococcus pyogenes NOT DETECTED NOT DETECTED Final   Acinetobacter baumannii NOT DETECTED NOT DETECTED Final   Enterobacteriaceae species NOT DETECTED NOT DETECTED Final   Enterobacter cloacae complex NOT DETECTED NOT DETECTED Final   Escherichia coli NOT DETECTED NOT DETECTED Final   Klebsiella oxytoca NOT DETECTED NOT DETECTED Final   Klebsiella pneumoniae NOT DETECTED NOT DETECTED Final   Proteus species NOT DETECTED NOT  DETECTED Final   Serratia marcescens NOT DETECTED NOT DETECTED Final   Haemophilus influenzae NOT DETECTED NOT DETECTED Final   Neisseria meningitidis NOT DETECTED NOT DETECTED Final   Pseudomonas aeruginosa NOT DETECTED NOT DETECTED Final   Candida albicans NOT DETECTED NOT DETECTED Final   Candida glabrata NOT DETECTED NOT DETECTED Final   Candida krusei NOT DETECTED NOT DETECTED Final   Candida parapsilosis NOT DETECTED NOT DETECTED Final   Candida tropicalis NOT DETECTED NOT DETECTED Final  Culture, blood (Routine X 2) w Reflex to ID Panel     Status: None   Collection Time: 05/25/17  3:25 PM  Result Value Ref Range Status   Specimen Description BLOOD BLOOD RIGHT HAND  Final   Special Requests IN PEDIATRIC BOTTLE Blood Culture adequate volume  Final   Culture NO GROWTH 5 DAYS  Final   Report Status 05/30/2017 FINAL  Final  Culture, blood (routine x 2)     Status: None   Collection Time: 05/26/17 12:20 AM  Result Value Ref Range Status   Specimen Description BLOOD LEFT ANTECUBITAL  Final   Special Requests   Final    BOTTLES DRAWN AEROBIC AND ANAEROBIC Blood Culture adequate volume   Culture NO GROWTH 5 DAYS  Final   Report Status 05/31/2017 FINAL  Final  Culture, blood (routine x 2)     Status: None   Collection Time: 05/26/17 12:22 AM  Result Value Ref Range Status   Specimen  Description BLOOD LEFT HAND  Final   Special Requests   Final    BOTTLES DRAWN AEROBIC ONLY Blood Culture adequate volume   Culture NO GROWTH 5 DAYS  Final   Report Status 05/31/2017 FINAL  Final  Surgical pcr screen     Status: None   Collection Time: 05/31/17  3:21 AM  Result Value Ref Range Status   MRSA, PCR NEGATIVE NEGATIVE Final   Staphylococcus aureus NEGATIVE NEGATIVE Final    Comment: (NOTE) The Xpert SA Assay (FDA approved for NASAL specimens in patients 65 years of age and older), is one component of a comprehensive surveillance program. It is not intended to diagnose infection nor to guide or monitor treatment.     Radiology Studies: Dg Esophagus  Result Date: 05/30/2017 CLINICAL DATA:  Chest pain. EXAM: ESOPHOGRAM / BARIUM SWALLOW / BARIUM TABLET STUDY TECHNIQUE: Combined double contrast and single contrast examination performed using effervescent crystals, thick barium liquid, and thin barium liquid. The patient was observed with fluoroscopy swallowing a 13 mm barium sulphate tablet. FLUOROSCOPY TIME:  Fluoroscopy Time:  1 minutes 0 seconds COMPARISON:  None. FINDINGS: The mucosa and motility of the esophagus are normal. There is no hiatal hernia, stricture, mass, or esophagitis. A 13 mm barium tablet passed immediately from the mouth to the stomach with no delay. IMPRESSION: Normal barium esophagram. Electronically Signed   By: Lorriane Shire M.D.   On: 05/30/2017 08:40   Ct Coronary Morph W/cta Cor W/score W/ca W/cm &/or Wo/cm  Addendum Date: 05/29/2017   ADDENDUM REPORT: 05/29/2017 17:40 CLINICAL DATA:  52 -year-old female with aortic valve endocarditis. EXAM: Cardiac/Coronary  CT TECHNIQUE: The patient was scanned on a [employer]. FINDINGS: A 120 kV prospective scan was triggered in the descending thoracic aorta at 111 HU's. Axial non-contrast 3 mm slices were carried out through the heart. The data set was analyzed on a dedicated work station and scored using  the Matthews. Gantry rotation speed was 250 msecs and collimation was .6 mm.  15 mg of iv Metoprolol and 0.8 mg of sl NTG was given. The 3D data set was reconstructed in 5% intervals of the 67-82 % of the R-R cycle. Diastolic phases were analyzed on a dedicated work station using MPR, MIP and VRT modes. The patient received 80 cc of contrast. Aorta:  Normal size.  No calcifications.  No dissection. Aortic Valve: Trileaflet. Thickening and vegetations seen on the left and non-coronary cusps. Coronary Arteries:  Normal coronary origin.  Right dominance. RCA is a large dominant artery that gives rise to PDA and a small PLA. There is no plaque. Left main is a large and long artery that gives rise to LAD and LCX arteries. Ther eis no plaque. LAD is a large vessel that gives rise to a small diagonal branch. There is motion in the proximal LAD however no plaque is seen. LCX is a non-dominant artery that gives rise to one OM1 branch. There is no plaque. Other findings: Normal pulmonary vein drainage into the left atrium. Normal let atrial appendage without a thrombus. No ASD or VSD is seen. Mildly dilated pulmonary artery measuring 32 mm. IMPRESSION: 1. Coronary calcium score of 0. This was 0 percentile for age and sex matched control. 2. Normal coronary origin with right dominance. 3. There is motion in the proximal LAD, however no plaque is seen. There is no evidence of CAD. 4. Mildly dilated pulmonary artery measuring 32 mm. 5. Aortic valve is trileaflet, thickened, with vegetations seen on the left and non-coronary cusps. Electronically Signed   By: Ena Dawley   On: 05/29/2017 17:40   Result Date: 05/29/2017 EXAM: OVER-READ INTERPRETATION  CT CHEST The following report is an over-read performed by radiologist Dr. Forest Gleason [facility] on 05/29/2017. This over-read does not include interpretation of cardiac or coronary anatomy or pathology. The coronary CTA interpretation by the  cardiologist is attached. COMPARISON:  05/24/2017 chest CTA. FINDINGS: Vascular: Normal aortic caliber, without dissection. No central pulmonary embolism, on this non-dedicated study. Mediastinum/Nodes: No imaged thoracic adenopathy. Lungs/Pleura: No pleural fluid. Lingular atelectasis. 4 mm right middle lobe pulmonary nodule on image 18/series 13. Upper Abdomen: Normal imaged portions of the liver, spleen, stomach, adrenal glands, left kidney. Musculoskeletal: No acute osseous abnormality. IMPRESSION: 1.  No acute findings in the imaged extracardiac chest. 2. Isolated 4 mm right middle lobe pulmonary nodule. No follow-up needed if patient is low-risk. Non-contrast chest CT can be considered in 12 months if patient is high-risk. This recommendation follows the consensus statement: Guidelines for Management of Incidental Pulmonary Nodules Detected on CT Images: From the Fleischner Society 2017; Radiology 2017; 284:228-243. Electronically Signed: By: Abigail Miyamoto M.D. On: 05/29/2017 16:57    Scheduled Meds: . fluconazole  200 mg Oral Daily  . metoprolol tartrate  25 mg Oral BID  . pantoprazole  40 mg Oral BID AC  . sucralfate  1 g Oral TID PC & HS   Continuous Infusions: . cefTRIAXone (ROCEPHIN)  IV Stopped (05/30/17 2216)  . gentamicin Stopped (05/30/17 1829)  . lactated ringers       LOS: 5 days    Barton Dubois, MD [facility] 05/31/2017, 4:09 PM Pager: [phone]  If 7PM-7AM, please contact night-coverage [email or web address] Password [email or web address] 05/31/2017, 4:09 PM

## 2017-05-31 NOTE — Anesthesia Postprocedure Evaluation (Signed)
Anesthesia Post Note  Patient: Krystal Shaffer  Procedure(s) Performed: Extraction of tooth  #'s [phone] with alveoloplasty and gross debridement of remaining dentition (N/A )     Patient location during evaluation: PACU Anesthesia Type: General Level of consciousness: awake and alert Pain management: pain level controlled Vital Signs Assessment: post-procedure vital signs reviewed and stable Respiratory status: spontaneous breathing, nonlabored ventilation, respiratory function stable and patient connected to nasal cannula oxygen Cardiovascular status: blood pressure returned to baseline and stable Postop Assessment: no apparent nausea or vomiting Anesthetic complications: no    Last Vitals:  Vitals:   05/31/17 1200 05/31/17 1218  BP: (!) 120/55 (!) 131/51  Pulse: 86 90  Resp: 20 16  Temp:  37 C  SpO2: 94% 96%    Last Pain:  Vitals:   05/31/17 1218  TempSrc: Axillary  PainSc:                  Effie Berkshire

## 2017-05-31 NOTE — Op Note (Signed)
OPERATIVE REPORT  Patient:            Krystal Shaffer Date of Birth:  1976-01-30 MRN:                [id number]   DATE OF PROCEDURE:  05/31/2017  PREOPERATIVE DIAGNOSES: 1.   Aortic valve endocarditis 2.  Pre-heart valve surgery dental protocol 3.  Chronic apical periodontitis 4.  Retained root segments 5.  Dental caries 6.  Chronic periodontitis 7.  Accretions  POSTOPERATIVE DIAGNOSES: 1.   Aortic valve endocarditis 2.  Pre-heart valve surgery dental protocol 3.  Chronic apical periodontitis 4.  Retained root segments 5.  Dental caries 6.  Chronic periodontitis 7.  Accretions  OPERATIONS: 1. Multiple extraction of tooth numbers 4, 5, 6, 12, and 15 2.  2 quadrants of alveoloplasty 3. Gross debridement of remaining dentition   SURGEON: Lenn Cal, DDS  ANESTHESIA: General anesthesia via oral endotracheal tube.  MEDICATIONS: 1. Ancef 2 g IV prior to invasive dental procedures. 2. Local anesthesia with a total utilization of 4 carpules each containing 34 mg of lidocaine with 0.017 mg of epinephrine.  SPECIMENS: There are 5 teeth that were discarded.  DRAINS: None  CULTURES: None  COMPLICATIONS: None   ESTIMATED BLOOD LOSS: 50 MLs.  INTRAVENOUS FLUIDS: Lactated ringers solution per anesthesia team records  INDICATIONS: The patient was recently diagnosed with aortic valve endocarditis.  A medically necessary dental consultation was then requested to evaluate poor dentition.  The patient was examined and treatment planned for multiple extractions with alveoloplasty and gross debridement of remaining dentition in the operating room with general anesthesia.  This treatment plan was formulated to decrease the risks and complications associated with dental infection from further affecting the patient's systemic health and the anticipated heart valve surgery.  OPERATIVE FINDINGS: Patient was examined in operating room number 5.  The teeth were identified for  extraction. The patient was noted to be affected by chronic periodontitis, chronic apical periodontitis, multiple retained root segments, dental caries, and accretions.   DESCRIPTION OF PROCEDURE: Patient was brought to the main operating room number 5. Patient was then placed in the supine position on the operating table.  General anesthesia was then induced per the anesthesia team. The patient was then prepped and draped in the usual manner for dental medicine procedure. A timeout was performed. The patient was identified and procedures were verified. A throat pack was placed at this time. The oral cavity was then thoroughly examined with the findings noted above. The patient was then ready for dental medicine procedure as follows:  Local anesthesia was then administered sequentially with a total utilization of 4 carpules each containing 34 mg of lidocaine with 0.017 mg of epinephrine.  The Maxillary left and right quadrants were first approached. Anesthesia was then delivered utilizing infiltration with lidocaine with epinephrine. A #15 blade incision was then made from the mesial of #3 and extended to the mesial of #7.  A  surgical flap was then carefully reflected. Appropriate amounts of buccal and interseptal bone were then removed utilizing a surgical handpiece and bur and copious amounts of sterile water around tooth numbers 4, 5, and 6.  The teeth were then subluxated with a series straight elevators.  Tooth numbers 4, 5, 6 were then removed without complications.  Alveoloplasty was then performed utilizing a rongeurs and bone file.  The tissues were approximated and trimmed appropriately.  The surgical site was irrigated with copious amounts of sterile saline.  A  piece of Surgifoam was placed in each extraction socket appropriately.  The surgical site was then closed from the mesial #3 and extended the distal of #7 utilizing 3-0 chromic gut suture in a continuous interrupted suture technique  x1.  At this point time of the maxillary left quadrant was approached.  A 15 blade incision was then made from the maxillary left tuberosity and extended to the mesial of #11. A surgical flap was then carefully reflected. Appropriate amounts of buccal and interseptal bone was then removed around the tooth numbers 12 an 15 with a surgical handpiece and bur and copious amounts of sterile water. The teeth were then subluxated with a series of straight elevators.  Tooth #15 was then removed with a 53L forceps without complications.  Tooth #12 was then removed with a 150 forceps without complications.  Alveoloplasty was then performed utilizing a ronguers and bone file.  The tissues were approximated and trimmed appropriately.  The surgical site was irrigated with copious amounts of sterile saline.  A piece of Surgifoam was placed in the extraction sockets appropriately.  Surgical site was then closed from the maxillary left tuberosity and extended to the distal of #11 utilizing 3-0 chromic gut suture in a continuous interrupted suture technique x1.  At this point time, the remaining dentition was approached.  A sonic scaler was used to remove accretions.  A series of hand curettes were then used to further remove accretions.  The Sonic scaler was then again used to further refine the removal of Accretions.  This completed the gross debridement procedure.  At this point time, the entire mouth was irrigated with copious amounts of sterile saline. The patient was examined for complications, seeing none, the dental medicine procedure was deemed to be complete. The throat pack was removed at this time. A series of 4 x 4 gauze were placed in the mouth to aid hemostasis. The patient was then handed over to the anesthesia team for final disposition. After an appropriate amount of time, the patient was extubated and taken to the postanesthsia care unit in good condition. All counts were correct for the dental medicine  procedure.  The Lovenox therapy may be restarted this evening at the discretion of the cardiology team at 11:55 PM.  Dr. Prescott Gum will proceed with heart valve surgery at his discretion.   Lenn Cal, DDS.

## 2017-06-01 ENCOUNTER — Inpatient Hospital Stay (HOSPITAL_COMMUNITY): Payer: 59

## 2017-06-01 ENCOUNTER — Encounter (HOSPITAL_COMMUNITY): Payer: Self-pay | Admitting: Dentistry

## 2017-06-01 LAB — APTT: aPTT: 30 seconds (ref 24–36)

## 2017-06-01 LAB — PULMONARY FUNCTION TEST
DL/VA % pred: 90 %
DL/VA: 4.35 ml/min/mmHg/L
DLCO cor % pred: 65 %
DLCO cor: 15.98 ml/min/mmHg
DLCO unc % pred: 54 %
DLCO unc: 13.15 ml/min/mmHg
FEF 25-75 Post: 3.77 L/sec
FEF 25-75 Pre: 2.86 L/sec
FEF2575-%Change-Post: 31 %
FEF2575-%Pred-Post: 133 %
FEF2575-%Pred-Pre: 101 %
FEV1-%Change-Post: 7 %
FEV1-%Pred-Post: 93 %
FEV1-%Pred-Pre: 86 %
FEV1-Post: 2.37 L
FEV1-Pre: 2.2 L
FEV1FVC-%Change-Post: 3 %
FEV1FVC-%Pred-Pre: 104 %
FEV6-%Change-Post: 4 %
FEV6-%Pred-Post: 87 %
FEV6-%Pred-Pre: 83 %
FEV6-Post: 2.64 L
FEV6-Pre: 2.52 L
FEV6FVC-%Pred-Post: 102 %
FEV6FVC-%Pred-Pre: 102 %
FVC-%Change-Post: 3 %
FVC-%Pred-Post: 85 %
FVC-%Pred-Pre: 82 %
FVC-Post: 2.64 L
FVC-Pre: 2.54 L
Post FEV1/FVC ratio: 90 %
Post FEV6/FVC ratio: 100 %
Pre FEV1/FVC ratio: 86 %
Pre FEV6/FVC Ratio: 100 %
RV % pred: 102 %
RV: 1.62 L
TLC % pred: 86 %
TLC: 4.35 L

## 2017-06-01 LAB — URINALYSIS, ROUTINE W REFLEX MICROSCOPIC
Bacteria, UA: NONE SEEN
Bilirubin Urine: NEGATIVE
Glucose, UA: NEGATIVE mg/dL
Ketones, ur: 5 mg/dL — AB
Leukocytes, UA: NEGATIVE
Nitrite: NEGATIVE
Protein, ur: NEGATIVE mg/dL
Specific Gravity, Urine: 1.019 (ref 1.005–1.030)
pH: 5 (ref 5.0–8.0)

## 2017-06-01 LAB — BASIC METABOLIC PANEL WITH GFR
Anion gap: 8 (ref 5–15)
BUN: 7 mg/dL (ref 6–20)
CO2: 25 mmol/L (ref 22–32)
Calcium: 8.8 mg/dL — ABNORMAL LOW (ref 8.9–10.3)
Chloride: 101 mmol/L (ref 101–111)
Creatinine, Ser: 0.74 mg/dL (ref 0.44–1.00)
GFR calc Af Amer: >60 mL/min
GFR calc non Af Amer: >60 mL/min
Glucose, Bld: 82 mg/dL (ref 65–99)
Potassium: 3.9 mmol/L (ref 3.5–5.1)
Sodium: 134 mmol/L — ABNORMAL LOW (ref 135–145)

## 2017-06-01 LAB — SURGICAL PCR SCREEN
MRSA, PCR: NEGATIVE
Staphylococcus aureus: NEGATIVE

## 2017-06-01 MED ORDER — ALBUTEROL SULFATE (2.5 MG/3ML) 0.083% IN NEBU
2.5000 mg | INHALATION_SOLUTION | Freq: Once | RESPIRATORY_TRACT | Status: AC
  Administered 2017-06-01: 2.5 mg via RESPIRATORY_TRACT

## 2017-06-01 MED ORDER — SACCHAROMYCES BOULARDII 250 MG PO CAPS
250.0000 mg | ORAL_CAPSULE | Freq: Two times a day (BID) | ORAL | Status: DC
  Administered 2017-06-01 – 2017-06-03 (×5): 250 mg via ORAL
  Filled 2017-06-01 (×5): qty 1

## 2017-06-01 MED ORDER — ENSURE ENLIVE PO LIQD
237.0000 mL | Freq: Three times a day (TID) | ORAL | Status: DC
  Administered 2017-06-01 – 2017-06-03 (×3): 237 mL via ORAL

## 2017-06-01 MED ORDER — SODIUM CHLORIDE 0.9% FLUSH: 10.0000 mL | INTRAVENOUS | Status: DC | PRN

## 2017-06-01 NOTE — Progress Notes (Addendum)
1 Day Post-Op Procedure(s) (LRB): Extraction of tooth  #'s [phone] with alveoloplasty and gross debridement of remaining dentition (N/A) Subjective: Mouth clean and dry after dental extractions Plan AVR mon am 12-24  Objective: Vital signs in last 24 hours: Temp:  [98 F (36.7 C)-98.7 F (37.1 C)] 98.3 F (36.8 C) (12/21 1500) Pulse Rate:  [84-90] 84 (12/21 1500) Resp:  [16] 16 (12/21 1500) BP: (120-130)/(48-57) 127/48 (12/21 1500) SpO2:  [96 %-98 %] 98 % (12/21 1500)  Hemodynamic parameters for last 24 hours:    Intake/Output from previous day: 12/20 0701 - 12/21 0700 In: 460 [I.V.:400; IV Piggyback:60] Out: 20 [Blood:20] Intake/Output this shift: No intake/output data recorded.       Exam    General- alert and comfortable   Lungs- clear without rales, wheezes   Cor- regular rate and rhythm, 3/6 AI  murmur , no gallop   Abdomen- soft, non-tender   Extremities - warm, non-tender, minimal edema   Neuro- oriented, appropriate, no focal weakness   Lab Results: No results for input(s): WBC, HGB, HCT, PLT in the last 72 hours. BMET:  Recent Labs    05/30/17 0424 06/01/17 0457  NA 134* 134*  K 4.1 3.9  CL 102 101  CO2 23 25  GLUCOSE 82 82  BUN 9 7  CREATININE 0.79 0.74  CALCIUM 8.6* 8.8*    PT/INR: No results for input(s): LABPROT, INR in the last 72 hours. ABG    Component Value Date/Time   TCO2 22 02/07/2012 1546   CBG (last 3)  No results for input(s): GLUCAP in the last 72 hours.  Assessment/Plan: S/P Procedure(s) (LRB): Extraction of tooth  #'s [phone] with alveoloplasty and gross debridement of remaining dentition (N/A) AVR mon We discussed the choice of prosthetic valve and because of young age of 61 years I recommended mechanical valve. She understands that this will require lifelong commitment to Coumadin-warfarin.   LOS: 6 days    Tharon Aquas Trigt III 06/01/2017

## 2017-06-01 NOTE — Progress Notes (Signed)
POST OPERATIVE NOTE:  06/01/2017 Krystal Shaffer [id number]  VITALS: BP (!) 120/51   Pulse 85   Temp 98.7 F (37.1 C) (Oral)   Resp 16   Ht 5\' 4"  (1.626 m)   Wt 150 lb (68 kg)   LMP 05/25/2017   SpO2 97%   BMI 25.75 kg/m   LABS:  Lab Results  Component Value Date   WBC 6.8 05/25/2017   HGB 8.8 (L) 05/25/2017   HCT 28.7 (L) 05/25/2017   MCV 82.7 05/25/2017   PLT 254 05/25/2017   BMET    Component Value Date/Time   NA 134 (L) 05/30/2017 0424   K 4.1 05/30/2017 0424   CL 102 05/30/2017 0424   CO2 23 05/30/2017 0424   GLUCOSE 82 05/30/2017 0424   BUN 9 05/30/2017 0424   CREATININE 0.79 05/30/2017 0424   CALCIUM 8.6 (L) 05/30/2017 0424   GFRNONAA >60 05/30/2017 0424   GFRAA >60 05/30/2017 0424    No results found for: INR, PROTIME No results found for: PTT   Krystal Shaffer is status post multiple extractions with alveoloplasty and gross debridement of remaining dentition in the operating room with general anesthesia on 05/31/2017.  SUBJECTIVE: Patient with minimal complaints from dental extractions. Denies any active bleeding.  EXAM: There is no sign of infection, heme, or ooze. Sutures are intact.    ASSESSMENT: Post operative course is consistent with dental procedures performed in the operating room with general anesthesia Loss of teeth due to extraction Chronic periodontal disease Dental caries   PLAN: 1. Use salt water rinses every 2 hours while awake to aid healing. 2. Call for appointment in January 2019 for evaluation of healing after patient is discharged from anticipated heart valve surgery. 3. Patient to follow the dentist of her choice for continued periodontal care, dental restorations, and evaluation for replacement of missing teeth once medically stable from the anticipated heart valve surgery. Patient will require antibiotic premedication prior to invasive dental procedures per American Heart Association guidelines. 4. Patient is cleared  for heart valve surgery at this time.    Lenn Cal, DDS

## 2017-06-01 NOTE — Progress Notes (Signed)
Peripherally Inserted Central Catheter/Midline Placement  The IV Nurse has discussed with the patient and/or persons authorized to consent for the patient, the purpose of this procedure and the potential benefits and risks involved with this procedure.  The benefits include less needle sticks, lab draws from the catheter, and the patient may be discharged home with the catheter. Risks include, but not limited to, infection, bleeding, blood clot (thrombus formation), and puncture of an artery; nerve damage and irregular heartbeat and possibility to perform a PICC exchange if needed/ordered by physician.  Alternatives to this procedure were also discussed.  Bard Power PICC patient education guide, fact sheet on infection prevention and patient information card has been provided to patient /or left at bedside.    PICC/Midline Placement Documentation  PICC Single Lumen 06/01/17 PICC Right Brachial 36 cm 0 cm (Active)  Indication for Insertion or Continuance of Line Home intravenous therapies (PICC only) 06/01/2017  1:00 PM  Exposed Catheter (cm) 0 cm 06/01/2017  1:00 PM  Site Assessment Clean;Dry;Intact 06/01/2017  1:00 PM  Line Status Flushed;Blood return noted 06/01/2017  1:00 PM  Dressing Type Transparent 06/01/2017  1:00 PM  Dressing Status Clean;Dry;Intact;Antimicrobial disc in place 06/01/2017  1:00 PM  Dressing Intervention New dressing 06/01/2017  1:00 PM  Dressing Change Due 06/08/17 06/01/2017  1:00 PM       Christella Noa Albarece 06/01/2017, 1:43 PM

## 2017-06-01 NOTE — Progress Notes (Signed)
PROGRESS NOTE    Krystal Shaffer  XAJ:[id number] DOB: 05-23-1976 DOA: 05/24/2017 PCP: Sofie Rower, PA-C   Brief Narrative: Krystal Shaffer is a 41 y.o. female with medical history significant for recent E. coli UTI with failed outpatient management requiring short inpatient stay for antibiotic therapy who presents to the ED with continued fever, chills, night sweats, myalgias. She presented with sepsis concerning for endocarditis with new murmur.   Assessment & Plan:   Principal Problem:   Aortic valve endocarditis Active Problems:   Sepsis ([place])   Bacteremia due to Streptococcus   Diastolic murmur   Bacterial vaginosis   Atypical chest pain   Temporal headache   Streptococcal sepsis ([facility])   Right-sided chest pain   Sepsis Streptococcal viridans bacteremia -BCID positive for strep. 2/2 cultures (12/13) positive.  -continue ceftriaxone and gentamicin  -Dr. Enrique Sack performed dental extractions on 12/20; well tolerated -cardiology consulted for stratification prior to valve replacement; and work up demonstrated normal coronary artery and from their stand point clear to proceed with surgery w/o any further work up. -currently planning for AVR on Monday -follow any further rec's from ID  Aortic valve vegetation -New. With high concerns for underlying endocarditis. -TEE confirmed large vegetation  -Cardiothoracic surgery planning valve replacement next Monday -as pre-surgery evaluation/conditioning patient is S/P dental extractions by Dr. Enrique Sack on 12/20; overall well tolerated.  -cardiology has done coronary CT and results demonstrated no abnormalities -patient is good to proceed with surgery from cardiology stand point   History of UTI/Flank pain -Patient with under-treated UTI secondary to non-adherence secondary to nausea and vomiting prior to admission.  -Urine culture with no growth -patient is now denying dysuria and has remained afebrile. -currently  receiving rocephin for her endocarditis (which empirically with also treat underlying UTI)  Bacterial vaginosis -treated with flagyl  -no dysuria or vaginal discharge reported   Normocytic anemia -Iron studies in line with anemia of chronic disease -will monitor Hgb trend  -no transfusion needed   Headache -Patient reports improvement/resolution of her migraine with the use of Depakote. -Continue PRN fioricet  -will monitor and further assist as needed   Atypical chest pain -Most likely associated reflux and/or dysmotility.  -Following GI recommendations plan is for esophageal barium testing  -Continue as needed GI cocktail, PPI twice a day and analgesics. -Also empirically started on Diflucan. -Troponins. No ischemic changes on EKG and reassuring echocardiogram. -coronary CT has rule out ischemic cause for her discomfort.  -improved overall  Mucous in her stools -will use florastor  -most likely associated with prolonged antibiotics usage  DVT prophylaxis: Lovenox Code Status: Full code Family Communication: None at bedside Disposition Plan: Remains inpatient. S/P dental extractions on 12/20 and Planned next week AVR (currently planned for Monday). Continue IV antibiotics and follow further ID/CVTS rec's.  Consultants:   ID  Cardiology  Dental surgeon  CVTS  Procedures:   Echocardiogram (12/14) - Left ventricle: The cavity size was normal. Systolic function was   normal. The estimated ejection fraction was in the range of 60%   to 65%. Wall motion was normal; there were no regional wall   motion abnormalities. Doppler parameters are consistent with   abnormal left ventricular relaxation (grade 1 diastolic   dysfunction). - Aortic valve: Trileaflet; moderately thickened leaflets with 1cm   x 1cm mass on the right coronary cusp, mild mobility, suggestive   of vegetation. If no signs of infection, consider fibroelastoma.   There was severe stenosis. Valve area  (VTI): 1.32 cm^2.  Valve   area (Vmax): 1.41 cm^2. Valve area (Vmean): 1.28 cm^2.   TEE (12/18) - Left ventricle: The cavity size was normal. Wall thickness was   normal. Systolic function was normal. The estimated ejection   fraction was in the range of 60% to 65%. - Aortic valve: Functionally bicuspid; moderately thickened   leaflets. There was a vegetation. There was a large, 1.4 cm (L) x   0.7 cm (W), fixed vegetation on the noncoronary cusp. Another   vegetation is present at the tip of the fusion point of right and   left coronary cusp, 0.7 x 0.5cm. There was severe regurgitation. - Mitral valve: No evidence of vegetation. There was mild   regurgitation. - Left atrium: The atrium was dilated. No evidence of thrombus in   the appendage. - Right atrium: No evidence of thrombus in the atrial cavity or   appendage. - Atrial septum: No defect or patent foramen ovale was identified. - Tricuspid valve: No evidence of vegetation. - Pulmonic valve: No evidence of vegetation. - Superior vena cava: The study excluded a thrombus. - Pericardium, extracardiac: A trivial pericardial effusion was   identified.   Multiple dental extractions anticipated later today 12/20  Antimicrobials:  Ceftriaxone  Gentamicin   Diflucan    Subjective: No CP, no nausea, no vomiting and no HA's currently. Patient reporting some mucous seen in her stools.   Objective: Vitals:   05/31/17 1218 05/31/17 2144 06/01/17 0500 06/01/17 1500  BP: (!) 131/51 (!) 130/57 (!) 120/51 (!) 127/48  Pulse: 90 90 85 84  Resp: 16 16  16   Temp: 98.6 F (37 C) 98 F (36.7 C) 98.7 F (37.1 C) 98.3 F (36.8 C)  TempSrc: Axillary Oral Oral Oral  SpO2: 96% 96% 97% 98%  Weight:      Height:        Intake/Output Summary (Last 24 hours) at 06/01/2017 1748 Last data filed at 05/31/2017 1827 Gross per 24 hour  Intake 60 ml  Output -  Net 60 ml   Filed Weights   05/24/17 1405 05/29/17 0730  Weight: 68 kg (150  lb) 68 kg (150 lb)    Examination: General exam: afebrile, no CP, no SOB currently. Reporting some mucous in her stools.   Rest of physical exam unchanged from my examination on 12/20; see below for details: Respiratory system: Clear to auscultation bilaterally; normal respiratory effort.     Cardiovascular system: Positive systolic ejection murmur, regular rate and rhythm, no JVD, no rubs, no gallops. Gastrointestinal system: Soft, nontender, nondistended, positive bowel sounds. Central nervous system/psychiatry: Cranial nerves intact, no focal neurologic or motor deficit on exam. Normal judgment and insight. Extremities: No edema, no cyanosis, no clubbing.   Skin: Intact, no petechiae, no bruises, no rashes, no open ulcers.   Data Reviewed: I have personally reviewed following labs and imaging studies  Basic Metabolic Panel: Recent Labs  Lab 05/30/17 0424 06/01/17 0457  NA 134* 134*  K 4.1 3.9  CL 102 101  CO2 23 25  GLUCOSE 82 82  BUN 9 7  CREATININE 0.79 0.74  CALCIUM 8.6* 8.8*   GFR: Estimated Creatinine Clearance: 88.5 mL/min (by C-G formula based on SCr of 0.74 mg/dL).   Cardiac Enzymes: Recent Labs  Lab 05/28/17 1154  TROPONINI <0.03    Recent Results (from the past 240 hour(s))  Urine culture     Status: None   Collection Time: 05/24/17  6:06 PM  Result Value Ref Range Status  Specimen Description URINE, RANDOM  Final   Special Requests NONE  Final   Culture NO GROWTH  Final   Report Status 05/26/2017 FINAL  Final  Culture, blood (routine x 2)     Status: Abnormal   Collection Time: 05/24/17 11:05 PM  Result Value Ref Range Status   Specimen Description BLOOD RIGHT ANTECUBITAL  Final   Special Requests   Final    BOTTLES DRAWN AEROBIC AND ANAEROBIC Blood Culture adequate volume   Culture  Setup Time   Final    GRAM POSITIVE COCCI IN CHAINS IN BOTH AEROBIC AND ANAEROBIC BOTTLES CRITICAL VALUE NOTED.  VALUE IS CONSISTENT WITH PREVIOUSLY REPORTED AND  CALLED VALUE.    Culture (A)  Final    VIRIDANS STREPTOCOCCUS SUSCEPTIBILITIES PERFORMED ON PREVIOUS CULTURE WITHIN THE LAST 5 DAYS.    Report Status 05/27/2017 FINAL  Final  Culture, blood (routine x 2)     Status: Abnormal   Collection Time: 05/24/17 11:14 PM  Result Value Ref Range Status   Specimen Description BLOOD LEFT WRIST  Final   Special Requests   Final    BOTTLES DRAWN AEROBIC AND ANAEROBIC Blood Culture adequate volume   Culture  Setup Time   Final    GRAM POSITIVE COCCI IN CHAINS IN BOTH AEROBIC AND ANAEROBIC BOTTLES CRITICAL RESULT CALLED TO, READ BACK BY AND VERIFIED WITH: JNARKLE,PHARMD @2115  05/25/17 BY LHOWARD    Culture VIRIDANS STREPTOCOCCUS (A)  Final   Report Status 05/27/2017 FINAL  Final   Organism ID, Bacteria VIRIDANS STREPTOCOCCUS  Final      Susceptibility   Viridans streptococcus - MIC*    PENICILLIN 1 INTERMEDIATE Intermediate     CEFTRIAXONE 1 SENSITIVE Sensitive     ERYTHROMYCIN <=0.12 SENSITIVE Sensitive     LEVOFLOXACIN 1 SENSITIVE Sensitive     VANCOMYCIN 0.5 SENSITIVE Sensitive     * VIRIDANS STREPTOCOCCUS  Blood Culture ID Panel (Reflexed)     Status: Abnormal   Collection Time: 05/24/17 11:14 PM  Result Value Ref Range Status   Enterococcus species NOT DETECTED NOT DETECTED Final   Listeria monocytogenes NOT DETECTED NOT DETECTED Final   Staphylococcus species NOT DETECTED NOT DETECTED Final   Staphylococcus aureus NOT DETECTED NOT DETECTED Final   Streptococcus species DETECTED (A) NOT DETECTED Final    Comment: Not Enterococcus species, Streptococcus agalactiae, Streptococcus pyogenes, or Streptococcus pneumoniae. CRITICAL RESULT CALLED TO, READ BACK BY AND VERIFIED WITH: JNARKLE,PHARMD @2115  05/25/17 BY LHOWARD    Streptococcus agalactiae NOT DETECTED NOT DETECTED Final   Streptococcus pneumoniae NOT DETECTED NOT DETECTED Final   Streptococcus pyogenes NOT DETECTED NOT DETECTED Final   Acinetobacter baumannii NOT DETECTED NOT  DETECTED Final   Enterobacteriaceae species NOT DETECTED NOT DETECTED Final   Enterobacter cloacae complex NOT DETECTED NOT DETECTED Final   Escherichia coli NOT DETECTED NOT DETECTED Final   Klebsiella oxytoca NOT DETECTED NOT DETECTED Final   Klebsiella pneumoniae NOT DETECTED NOT DETECTED Final   Proteus species NOT DETECTED NOT DETECTED Final   Serratia marcescens NOT DETECTED NOT DETECTED Final   Haemophilus influenzae NOT DETECTED NOT DETECTED Final   Neisseria meningitidis NOT DETECTED NOT DETECTED Final   Pseudomonas aeruginosa NOT DETECTED NOT DETECTED Final   Candida albicans NOT DETECTED NOT DETECTED Final   Candida glabrata NOT DETECTED NOT DETECTED Final   Candida krusei NOT DETECTED NOT DETECTED Final   Candida parapsilosis NOT DETECTED NOT DETECTED Final   Candida tropicalis NOT DETECTED NOT DETECTED Final  Culture, blood (  Routine X 2) w Reflex to ID Panel     Status: None   Collection Time: 05/25/17  3:25 PM  Result Value Ref Range Status   Specimen Description BLOOD BLOOD RIGHT HAND  Final   Special Requests IN PEDIATRIC BOTTLE Blood Culture adequate volume  Final   Culture NO GROWTH 5 DAYS  Final   Report Status 05/30/2017 FINAL  Final  Culture, blood (routine x 2)     Status: None   Collection Time: 05/26/17 12:20 AM  Result Value Ref Range Status   Specimen Description BLOOD LEFT ANTECUBITAL  Final   Special Requests   Final    BOTTLES DRAWN AEROBIC AND ANAEROBIC Blood Culture adequate volume   Culture NO GROWTH 5 DAYS  Final   Report Status 05/31/2017 FINAL  Final  Culture, blood (routine x 2)     Status: None   Collection Time: 05/26/17 12:22 AM  Result Value Ref Range Status   Specimen Description BLOOD LEFT HAND  Final   Special Requests   Final    BOTTLES DRAWN AEROBIC ONLY Blood Culture adequate volume   Culture NO GROWTH 5 DAYS  Final   Report Status 05/31/2017 FINAL  Final  Surgical pcr screen     Status: None   Collection Time: 05/31/17  3:21 AM    Result Value Ref Range Status   MRSA, PCR NEGATIVE NEGATIVE Final   Staphylococcus aureus NEGATIVE NEGATIVE Final    Comment: (NOTE) The Xpert SA Assay (FDA approved for NASAL specimens in patients 15 years of age and older), is one component of a comprehensive surveillance program. It is not intended to diagnose infection nor to guide or monitor treatment.     Radiology Studies: No results found.  Scheduled Meds: . feeding supplement (ENSURE ENLIVE)  237 mL Oral TID WC  . fluconazole  200 mg Oral Daily  . metoprolol tartrate  25 mg Oral BID  . pantoprazole  40 mg Oral BID AC  . sucralfate  1 g Oral TID PC & HS   Continuous Infusions: . cefTRIAXone (ROCEPHIN)  IV Stopped (06/01/17 0010)  . gentamicin Stopped (05/31/17 1827)  . lactated ringers       LOS: 6 days    Barton Dubois, MD [facility] 06/01/2017, 5:48 PM Pager: [phone]  If 7PM-7AM, please contact night-coverage [email or web address] Password [facility] 06/01/2017, 5:48 PM

## 2017-06-02 ENCOUNTER — Inpatient Hospital Stay (HOSPITAL_COMMUNITY): Payer: 59

## 2017-06-02 DIAGNOSIS — Z01810 Encounter for preprocedural cardiovascular examination: Secondary | ICD-10-CM

## 2017-06-02 LAB — CBC
HCT: 28 % — ABNORMAL LOW (ref 36.0–46.0)
Hemoglobin: 8.8 g/dL — ABNORMAL LOW (ref 12.0–15.0)
MCH: 25.9 pg — ABNORMAL LOW (ref 26.0–34.0)
MCHC: 31.4 g/dL (ref 30.0–36.0)
MCV: 82.4 fL (ref 78.0–100.0)
Platelets: 251 10*3/uL (ref 150–400)
RBC: 3.4 MIL/uL — ABNORMAL LOW (ref 3.87–5.11)
RDW: 15.4 % (ref 11.5–15.5)
WBC: 9.1 10*3/uL (ref 4.0–10.5)

## 2017-06-02 LAB — PROTIME-INR
INR: 1.18
Prothrombin Time: 14.9 seconds (ref 11.4–15.2)

## 2017-06-02 MED ORDER — KETOROLAC TROMETHAMINE 30 MG/ML IJ SOLN
30.0000 mg | Freq: Once | INTRAMUSCULAR | Status: AC
  Administered 2017-06-02: 30 mg via INTRAVENOUS
  Filled 2017-06-02: qty 1

## 2017-06-02 MED ORDER — INDOMETHACIN 25 MG PO CAPS
50.0000 mg | ORAL_CAPSULE | Freq: Three times a day (TID) | ORAL | Status: DC
  Administered 2017-06-02 – 2017-06-03 (×4): 50 mg via ORAL
  Filled 2017-06-02 (×6): qty 2

## 2017-06-02 MED ORDER — KETOROLAC TROMETHAMINE 15 MG/ML IJ SOLN
15.0000 mg | Freq: Three times a day (TID) | INTRAMUSCULAR | Status: DC | PRN
  Administered 2017-06-02 – 2017-06-03 (×5): 15 mg via INTRAVENOUS
  Filled 2017-06-02 (×5): qty 1

## 2017-06-02 NOTE — Progress Notes (Signed)
Patient returned from vascular lab 

## 2017-06-02 NOTE — Progress Notes (Signed)
Patient's right foot has progressively worsening r foot pain. RN last night called on call, they ordered one time dose of toradol, which helped, but no further testing. I called MD this morning and made him aware of the issue because the patient was crying in pain, and top of foot has visible red/swollen area on top. MD Dyann Kief said he would assess when he came to see patient today, patient made aware. Will continue to monitor.

## 2017-06-02 NOTE — Progress Notes (Addendum)
CARDIAC REHAB PHASE I   PRE:  Rate/Rhythm:   BP:  Sitting:       SaO2:   MODE:  Ambulation: 0  ft   POST:  Rate/Rhythm:   BP:  Sitting:      SaO2:  0830-0900  Upon arrival to patient room patient sitting on side of bed crying. Primary RN had already reported that patient has been complaining of right foot pain. Upon Cardiac Rehab RN assessment right top of foot red, warm, tender to touch, and swollen. Wt baring painful. Primary RN notified that patient would not be ambulating with cardiac rehab. MD was called. Patient repositioned in bed with right foot elevated. Patient stated she still would like to have pre-op education completed. Cardiac surgery booklet given with handout. Reviewed sternal precautions, IS (already at bedside), and encouraged watching video. Patient very receptive. Will follow up with patient post surgery.  Malcolm Quast English PayneRN, BSN 06/02/2017 9:07 AM

## 2017-06-02 NOTE — Progress Notes (Signed)
Pre Operative Dopplers completed for AVR. Bilateral - 1% to 39% ICA stenosis by velocities. RT cannot rule out FMD and tortuosity. Lt - tortuosity. Palmar arch is normal bilaterally. [place] Gissela Bloch,RVS 06/02/2017, 12:32 PM

## 2017-06-02 NOTE — Progress Notes (Signed)
Patient transported to vascular lab.

## 2017-06-02 NOTE — Progress Notes (Signed)
PROGRESS NOTE    Krystal Shaffer  NWG:[id number] DOB: [birth year] DOA: 05/24/2017 PCP: Sofie Rower, PA-C   Brief Narrative: Krystal Shaffer is a 41 y.o. female with medical history significant for recent E. coli UTI with failed outpatient management requiring short inpatient stay for antibiotic therapy who presents to the ED with continued fever, chills, night sweats, myalgias. She presented with sepsis concerning for endocarditis with new murmur.   Assessment & Plan:   Principal Problem:   Aortic valve endocarditis Active Problems:   Sepsis ([place])   Bacteremia due to Streptococcus   Diastolic murmur   Bacterial vaginosis   Atypical chest pain   Temporal headache   Streptococcal sepsis ([facility])   Right-sided chest pain   Sepsis Streptococcal viridans bacteremia -BCID positive for strep. 2/2 cultures (12/13) positive.  -continue ceftriaxone and gentamicin  -Dr. Enrique Sack performed dental extractions on 12/20; well tolerated -cardiology consulted for stratification prior to valve replacement; and work up demonstrated normal coronary artery and from their stand point clear to proceed with surgery w/o any further work up. -currently planning for AVR on Monday -follow any further rec's from ID  Aortic valve vegetation -New. With high concerns for underlying endocarditis. -TEE confirmed large vegetation  -Cardiothoracic surgery planning valve replacement next Monday -as pre-surgery evaluation/conditioning patient is S/P dental extractions by Dr. Enrique Sack on 12/20; overall well tolerated.  -cardiology has done coronary CT and results demonstrated no abnormalities -patient is good to proceed with surgery from cardiology stand point   History of UTI/Flank pain -Patient with under-treated UTI secondary to non-adherence secondary to nausea and vomiting prior to admission.  -Urine culture with no growth -patient is now denying dysuria and has remained afebrile. -currently  receiving rocephin for her endocarditis (which empirically with also treat underlying UTI)  Bacterial vaginosis -treated with flagyl  -no dysuria or vaginal discharge reported   Normocytic anemia -Iron studies in line with anemia of chronic disease -will monitor Hgb trend  -no transfusion needed   Headache -Patient reports improvement/resolution of her migraine with the use of Depakote. -Continue PRN fioricet  -will monitor and further assist as needed   Atypical chest pain -Most likely associated reflux and/or dysmotility.  -Following GI recommendations plan is for esophageal barium testing  -Continue as needed GI cocktail, PPI twice a day and analgesics. -Also empirically started on Diflucan. -Troponins. No ischemic changes on EKG and reassuring echocardiogram. -coronary CT has rule out ischemic cause for her discomfort.  -overall improved.   Mucous in her stools -Improve with the use of Florastor.    -most likely associated with prolonged antibiotics usage  Right food/ankle with swelling, pain and mild erythema. -Acute and overnight in nature -High concerns for gout given presentation -Will check uric acid level -Will treat with NSAIDs.  DVT prophylaxis: Lovenox Code Status: Full code Family Communication: None at bedside Disposition Plan: Remains inpatient. S/P dental extractions on 12/20 and Planned next week AVR (currently planned for Monday). Continue IV antibiotics and follow further ID/CVTS rec's.  Consultants:   ID  Cardiology  Dental surgeon  CVTS  Procedures:   Echocardiogram (12/14) - Left ventricle: The cavity size was normal. Systolic function was   normal. The estimated ejection fraction was in the range of 60%   to 65%. Wall motion was normal; there were no regional wall   motion abnormalities. Doppler parameters are consistent with   abnormal left ventricular relaxation (grade 1 diastolic   dysfunction). - Aortic valve: Trileaflet;  moderately thickened leaflets with 1cm  x 1cm mass on the right coronary cusp, mild mobility, suggestive   of vegetation. If no signs of infection, consider fibroelastoma.   There was severe stenosis. Valve area (VTI): 1.32 cm^2. Valve   area (Vmax): 1.41 cm^2. Valve area (Vmean): 1.28 cm^2.   TEE (12/18) - Left ventricle: The cavity size was normal. Wall thickness was   normal. Systolic function was normal. The estimated ejection   fraction was in the range of 60% to 65%. - Aortic valve: Functionally bicuspid; moderately thickened   leaflets. There was a vegetation. There was a large, 1.4 cm (L) x   0.7 cm (W), fixed vegetation on the noncoronary cusp. Another   vegetation is present at the tip of the fusion point of right and   left coronary cusp, 0.7 x 0.5cm. There was severe regurgitation. - Mitral valve: No evidence of vegetation. There was mild   regurgitation. - Left atrium: The atrium was dilated. No evidence of thrombus in   the appendage. - Right atrium: No evidence of thrombus in the atrial cavity or   appendage. - Atrial septum: No defect or patent foramen ovale was identified. - Tricuspid valve: No evidence of vegetation. - Pulmonic valve: No evidence of vegetation. - Superior vena cava: The study excluded a thrombus. - Pericardium, extracardiac: A trivial pericardial effusion was   identified.   Multiple dental extractions anticipated later today 12/20  Antimicrobials:  Ceftriaxone  Gentamicin   Diflucan    Subjective: No chest pain, no nausea, no vomiting, no headaches and no fever.  Patient complaining of right foot tenderness, swelling and redness.  Objective: Vitals:   06/01/17 2144 06/02/17 0000 06/02/17 0556 06/02/17 1501  BP: (!) 121/41  (!) 117/45 (!) 131/47  Pulse: (!) 105  88 96  Resp: 16  17 18   Temp: (!) 100.4 F (38 C) 98.7 F (37.1 C) 98.6 F (37 C) 98.8 F (37.1 C)  TempSrc: Oral Oral Oral Oral  SpO2: 98%  100% 100%  Weight:        Height:        Intake/Output Summary (Last 24 hours) at 06/02/2017 1617 Last data filed at 06/02/2017 0556 Gross per 24 hour  Intake 120 ml  Output -  Net 120 ml   Filed Weights   05/24/17 1405 05/29/17 0730  Weight: 68 kg (150 lb) 68 kg (150 lb)    Examination: General exam: No chest pain, no shortness of breath, no headaches, no nausea or vomiting.  Patient reports right ankle pain/swelling and mild redness.  No fever. Respiratory system: Clear to auscultation bilaterally, normal respiratory effort.    Cardiovascular system: Positive systolic ejection murmur, regular rate and rhythm, no JVD, no rubs, no gallops. Gastrointestinal system: Soft, nontender, nondistended, positive bowel sounds. Central nervous system/psychiatry: Cranial nerves intact, no focal neurological motor deficit on exam, normal judgment and insight.  Patient is alert, awake and oriented x3. Extremities: No cyanosis, no clubbing, right ankle/volar surface with tenderness on palpation, decreased range of motion positive swelling and mild erythema.   Skin: Very mild erythema in the volar aspect of her right foot.  No open ulcers, no bruises, no petechiae, no other abnormalities.   Data Reviewed: I have personally reviewed following labs and imaging studies  Basic Metabolic Panel: Recent Labs  Lab 05/30/17 0424 06/01/17 0457  NA 134* 134*  K 4.1 3.9  CL 102 101  CO2 23 25  GLUCOSE 82 82  BUN 9 7  CREATININE 0.79 0.74  CALCIUM  8.6* 8.8*   GFR: Estimated Creatinine Clearance: 88.5 mL/min (by C-G formula based on SCr of 0.74 mg/dL).   Cardiac Enzymes: Recent Labs  Lab 05/28/17 1154  TROPONINI <0.03    Recent Results (from the past 240 hour(s))  Urine culture     Status: None   Collection Time: 05/24/17  6:06 PM  Result Value Ref Range Status   Specimen Description URINE, RANDOM  Final   Special Requests NONE  Final   Culture NO GROWTH  Final   Report Status 05/26/2017 FINAL  Final  Culture,  blood (routine x 2)     Status: Abnormal   Collection Time: 05/24/17 11:05 PM  Result Value Ref Range Status   Specimen Description BLOOD RIGHT ANTECUBITAL  Final   Special Requests   Final    BOTTLES DRAWN AEROBIC AND ANAEROBIC Blood Culture adequate volume   Culture  Setup Time   Final    GRAM POSITIVE COCCI IN CHAINS IN BOTH AEROBIC AND ANAEROBIC BOTTLES CRITICAL VALUE NOTED.  VALUE IS CONSISTENT WITH PREVIOUSLY REPORTED AND CALLED VALUE.    Culture (A)  Final    VIRIDANS STREPTOCOCCUS SUSCEPTIBILITIES PERFORMED ON PREVIOUS CULTURE WITHIN THE LAST 5 DAYS.    Report Status 05/27/2017 FINAL  Final  Culture, blood (routine x 2)     Status: Abnormal   Collection Time: 05/24/17 11:14 PM  Result Value Ref Range Status   Specimen Description BLOOD LEFT WRIST  Final   Special Requests   Final    BOTTLES DRAWN AEROBIC AND ANAEROBIC Blood Culture adequate volume   Culture  Setup Time   Final    GRAM POSITIVE COCCI IN CHAINS IN BOTH AEROBIC AND ANAEROBIC BOTTLES CRITICAL RESULT CALLED TO, READ BACK BY AND VERIFIED WITH: JNARKLE,PHARMD @2115  05/25/17 BY LHOWARD    Culture VIRIDANS STREPTOCOCCUS (A)  Final   Report Status 05/27/2017 FINAL  Final   Organism ID, Bacteria VIRIDANS STREPTOCOCCUS  Final      Susceptibility   Viridans streptococcus - MIC*    PENICILLIN 1 INTERMEDIATE Intermediate     CEFTRIAXONE 1 SENSITIVE Sensitive     ERYTHROMYCIN <=0.12 SENSITIVE Sensitive     LEVOFLOXACIN 1 SENSITIVE Sensitive     VANCOMYCIN 0.5 SENSITIVE Sensitive     * VIRIDANS STREPTOCOCCUS  Blood Culture ID Panel (Reflexed)     Status: Abnormal   Collection Time: 05/24/17 11:14 PM  Result Value Ref Range Status   Enterococcus species NOT DETECTED NOT DETECTED Final   Listeria monocytogenes NOT DETECTED NOT DETECTED Final   Staphylococcus species NOT DETECTED NOT DETECTED Final   Staphylococcus aureus NOT DETECTED NOT DETECTED Final   Streptococcus species DETECTED (A) NOT DETECTED Final     Comment: Not Enterococcus species, Streptococcus agalactiae, Streptococcus pyogenes, or Streptococcus pneumoniae. CRITICAL RESULT CALLED TO, READ BACK BY AND VERIFIED WITH: JNARKLE,PHARMD @2115  05/25/17 BY LHOWARD    Streptococcus agalactiae NOT DETECTED NOT DETECTED Final   Streptococcus pneumoniae NOT DETECTED NOT DETECTED Final   Streptococcus pyogenes NOT DETECTED NOT DETECTED Final   Acinetobacter baumannii NOT DETECTED NOT DETECTED Final   Enterobacteriaceae species NOT DETECTED NOT DETECTED Final   Enterobacter cloacae complex NOT DETECTED NOT DETECTED Final   Escherichia coli NOT DETECTED NOT DETECTED Final   Klebsiella oxytoca NOT DETECTED NOT DETECTED Final   Klebsiella pneumoniae NOT DETECTED NOT DETECTED Final   Proteus species NOT DETECTED NOT DETECTED Final   Serratia marcescens NOT DETECTED NOT DETECTED Final   Haemophilus influenzae NOT DETECTED NOT DETECTED  Final   Neisseria meningitidis NOT DETECTED NOT DETECTED Final   Pseudomonas aeruginosa NOT DETECTED NOT DETECTED Final   Candida albicans NOT DETECTED NOT DETECTED Final   Candida glabrata NOT DETECTED NOT DETECTED Final   Candida krusei NOT DETECTED NOT DETECTED Final   Candida parapsilosis NOT DETECTED NOT DETECTED Final   Candida tropicalis NOT DETECTED NOT DETECTED Final  Culture, blood (Routine X 2) w Reflex to ID Panel     Status: None   Collection Time: 05/25/17  3:25 PM  Result Value Ref Range Status   Specimen Description BLOOD BLOOD RIGHT HAND  Final   Special Requests IN PEDIATRIC BOTTLE Blood Culture adequate volume  Final   Culture NO GROWTH 5 DAYS  Final   Report Status 05/30/2017 FINAL  Final  Culture, blood (routine x 2)     Status: None   Collection Time: 05/26/17 12:20 AM  Result Value Ref Range Status   Specimen Description BLOOD LEFT ANTECUBITAL  Final   Special Requests   Final    BOTTLES DRAWN AEROBIC AND ANAEROBIC Blood Culture adequate volume   Culture NO GROWTH 5 DAYS  Final   Report  Status 05/31/2017 FINAL  Final  Culture, blood (routine x 2)     Status: None   Collection Time: 05/26/17 12:22 AM  Result Value Ref Range Status   Specimen Description BLOOD LEFT HAND  Final   Special Requests   Final    BOTTLES DRAWN AEROBIC ONLY Blood Culture adequate volume   Culture NO GROWTH 5 DAYS  Final   Report Status 05/31/2017 FINAL  Final  Surgical pcr screen     Status: None   Collection Time: 05/31/17  3:21 AM  Result Value Ref Range Status   MRSA, PCR NEGATIVE NEGATIVE Final   Staphylococcus aureus NEGATIVE NEGATIVE Final    Comment: (NOTE) The Xpert SA Assay (FDA approved for NASAL specimens in patients 41 years of age and older), is one component of a comprehensive surveillance program. It is not intended to diagnose infection nor to guide or monitor treatment.   Surgical pcr screen     Status: None   Collection Time: 06/01/17  4:56 PM  Result Value Ref Range Status   MRSA, PCR NEGATIVE NEGATIVE Final   Staphylococcus aureus NEGATIVE NEGATIVE Final    Comment: (NOTE) The Xpert SA Assay (FDA approved for NASAL specimens in patients 9 years of age and older), is one component of a comprehensive surveillance program. It is not intended to diagnose infection nor to guide or monitor treatment.     Radiology Studies: Dg Chest 2 View  Result Date: 06/02/2017 CLINICAL DATA:  Preop for aortic valve replacement. EXAM: CHEST  2 VIEW COMPARISON:  Radiographs of May 24, 2017. FINDINGS: Stable cardiomegaly. No pneumothorax or pleural effusion is noted. Interval placement of right-sided PICC line with distal tip in expected position of the SVC. Both lungs are clear. The visualized skeletal structures are unremarkable. IMPRESSION: Stable cardiomegaly. Interval placement of right-sided PICC line. No other abnormality seen in the chest. Electronically Signed   By: Marijo Conception, M.D.   On: 06/02/2017 10:33    Scheduled Meds: . feeding supplement (ENSURE ENLIVE)  237 mL  Oral TID WC  . fluconazole  200 mg Oral Daily  . indomethacin  50 mg Oral TID WC  . metoprolol tartrate  25 mg Oral BID  . pantoprazole  40 mg Oral BID AC  . saccharomyces boulardii  250 mg Oral BID  .  sucralfate  1 g Oral TID PC & HS   Continuous Infusions: . cefTRIAXone (ROCEPHIN)  IV Stopped (06/01/17 2223)  . gentamicin Stopped (06/01/17 1848)  . lactated ringers       LOS: 7 days    Barton Dubois, MD [facility] 06/02/2017, 4:17 PM Pager: [phone]  If 7PM-7AM, please contact night-coverage [email or web address] Password [facility] 06/02/2017, 4:17 PM

## 2017-06-03 ENCOUNTER — Inpatient Hospital Stay (HOSPITAL_COMMUNITY): Payer: 59

## 2017-06-03 LAB — ABO/RH: ABO/RH(D): B POS

## 2017-06-03 LAB — URIC ACID: URIC ACID, SERUM: 5.2 mg/dL (ref 2.3–6.6)

## 2017-06-03 LAB — PREPARE RBC (CROSSMATCH)

## 2017-06-03 MED ORDER — SODIUM CHLORIDE 0.9 % IV SOLN
INTRAVENOUS | Status: DC
  Filled 2017-06-03: qty 1

## 2017-06-03 MED ORDER — DEXMEDETOMIDINE HCL IN NACL 400 MCG/100ML IV SOLN: 0.1000 ug/kg/h | INTRAVENOUS | Status: DC

## 2017-06-03 MED ORDER — EPINEPHRINE PF 1 MG/ML IJ SOLN: 0.0000 ug/min | INTRAVENOUS | Status: DC

## 2017-06-03 MED ORDER — TRANEXAMIC ACID 1000 MG/10ML IV SOLN: 1.5000 mg/kg/h | INTRAVENOUS | Status: DC

## 2017-06-03 MED ORDER — ALPRAZOLAM 0.25 MG PO TABS: 0.2500 mg | ORAL_TABLET | ORAL | Status: DC | PRN

## 2017-06-03 MED ORDER — DEXTROSE 5 % IV SOLN
750.0000 mg | INTRAVENOUS | Status: DC
  Filled 2017-06-03: qty 750

## 2017-06-03 MED ORDER — BISACODYL 5 MG PO TBEC: 5.0000 mg | DELAYED_RELEASE_TABLET | Freq: Once | ORAL | Status: DC

## 2017-06-03 MED ORDER — CHLORHEXIDINE GLUCONATE 4 % EX LIQD
60.0000 mL | Freq: Once | CUTANEOUS | Status: AC
  Administered 2017-06-04: 4 via TOPICAL
  Filled 2017-06-03: qty 60

## 2017-06-03 MED ORDER — SODIUM CHLORIDE 0.9 % IV SOLN: INTRAVENOUS | Status: DC

## 2017-06-03 MED ORDER — CYANOCOBALAMIN 1000 MCG/ML IJ SOLN
1000.0000 ug | Freq: Once | INTRAMUSCULAR | Status: AC
  Administered 2017-06-03: 1000 ug via SUBCUTANEOUS
  Filled 2017-06-03: qty 1

## 2017-06-03 MED ORDER — WHITE PETROLATUM EX OINT
TOPICAL_OINTMENT | CUTANEOUS | Status: AC
  Administered 2017-06-03: 18:00:00
  Filled 2017-06-03: qty 28.35

## 2017-06-03 MED ORDER — POTASSIUM CHLORIDE 2 MEQ/ML IV SOLN: 80.0000 meq | INTRAVENOUS | Status: DC

## 2017-06-03 MED ORDER — NITROGLYCERIN IN D5W 200-5 MCG/ML-% IV SOLN
2.0000 ug/min | INTRAVENOUS | Status: DC
  Filled 2017-06-03: qty 250

## 2017-06-03 MED ORDER — RISAQUAD PO CAPS
1.0000 | ORAL_CAPSULE | Freq: Every day | ORAL | Status: DC
  Administered 2017-06-03: 1 via ORAL
  Filled 2017-06-03: qty 1

## 2017-06-03 MED ORDER — TRANEXAMIC ACID 1000 MG/10ML IV SOLN
1.5000 mg/kg/h | INTRAVENOUS | Status: DC
  Filled 2017-06-03: qty 25

## 2017-06-03 MED ORDER — DIAZEPAM 2 MG PO TABS
2.0000 mg | ORAL_TABLET | Freq: Once | ORAL | Status: AC
  Administered 2017-06-04: 2 mg via ORAL
  Filled 2017-06-03: qty 1

## 2017-06-03 MED ORDER — MAGNESIUM SULFATE 50 % IJ SOLN: 40.0000 meq | INTRAMUSCULAR | Status: DC

## 2017-06-03 MED ORDER — DEXMEDETOMIDINE HCL IN NACL 400 MCG/100ML IV SOLN
0.1000 ug/kg/h | INTRAVENOUS | Status: DC
  Filled 2017-06-03: qty 100

## 2017-06-03 MED ORDER — METOPROLOL TARTRATE 12.5 MG HALF TABLET
12.5000 mg | ORAL_TABLET | Freq: Once | ORAL | Status: AC
  Administered 2017-06-04: 12.5 mg via ORAL
  Filled 2017-06-03: qty 1

## 2017-06-03 MED ORDER — CHLORHEXIDINE GLUCONATE 4 % EX LIQD
60.0000 mL | Freq: Once | CUTANEOUS | Status: AC
  Administered 2017-06-03: 4 via TOPICAL
  Filled 2017-06-03: qty 60

## 2017-06-03 MED ORDER — B COMPLEX-C PO TABS
1.0000 | ORAL_TABLET | Freq: Every day | ORAL | Status: DC
  Administered 2017-06-03: 1 via ORAL
  Filled 2017-06-03 (×2): qty 1

## 2017-06-03 MED ORDER — DOPAMINE-DEXTROSE 3.2-5 MG/ML-% IV SOLN: 0.0000 ug/kg/min | INTRAVENOUS | Status: DC

## 2017-06-03 MED ORDER — DEXTROSE 5 % IV SOLN
1.5000 g | INTRAVENOUS | Status: AC
  Administered 2017-06-04: 1.5 g via INTRAVENOUS
  Filled 2017-06-03: qty 1.5

## 2017-06-03 MED ORDER — PLASMA-LYTE 148 IV SOLN
INTRAVENOUS | Status: DC
  Filled 2017-06-03: qty 2.5

## 2017-06-03 MED ORDER — DEXTROSE 5 % IV SOLN: 750.0000 mg | INTRAVENOUS | Status: DC

## 2017-06-03 MED ORDER — DOPAMINE-DEXTROSE 3.2-5 MG/ML-% IV SOLN
0.0000 ug/kg/min | INTRAVENOUS | Status: DC
  Filled 2017-06-03: qty 250

## 2017-06-03 MED ORDER — TRANEXAMIC ACID (OHS) BOLUS VIA INFUSION: 15.0000 mg/kg | INTRAVENOUS | Status: DC

## 2017-06-03 MED ORDER — TRANEXAMIC ACID (OHS) PUMP PRIME SOLUTION
2.0000 mg/kg | INTRAVENOUS | Status: DC
  Filled 2017-06-03: qty 1.36

## 2017-06-03 MED ORDER — PHENYLEPHRINE HCL 10 MG/ML IJ SOLN
30.0000 ug/min | INTRAMUSCULAR | Status: DC
  Filled 2017-06-03: qty 2

## 2017-06-03 MED ORDER — MAGNESIUM SULFATE 50 % IJ SOLN
40.0000 meq | INTRAMUSCULAR | Status: DC
  Filled 2017-06-03: qty 9.85

## 2017-06-03 MED ORDER — FAMOTIDINE 20 MG PO TABS
20.0000 mg | ORAL_TABLET | Freq: Two times a day (BID) | ORAL | Status: DC
  Administered 2017-06-03: 20 mg via ORAL
  Filled 2017-06-03 (×2): qty 1

## 2017-06-03 MED ORDER — SACCHAROMYCES BOULARDII 250 MG PO CAPS: 250.0000 mg | ORAL_CAPSULE | Freq: Two times a day (BID) | ORAL | Status: DC

## 2017-06-03 MED ORDER — SODIUM CHLORIDE 0.9 % IV SOLN
INTRAVENOUS | Status: DC
  Filled 2017-06-03: qty 30

## 2017-06-03 MED ORDER — SODIUM CHLORIDE 0.9 % IV SOLN: 30.0000 ug/min | INTRAVENOUS | Status: DC

## 2017-06-03 MED ORDER — PAPAVERINE HCL 30 MG/ML IJ SOLN: INTRAMUSCULAR | Status: DC

## 2017-06-03 MED ORDER — TEMAZEPAM 15 MG PO CAPS
15.0000 mg | ORAL_CAPSULE | Freq: Once | ORAL | Status: AC | PRN
  Administered 2017-06-03: 15 mg via ORAL
  Filled 2017-06-03: qty 1

## 2017-06-03 MED ORDER — FERROUS SULFATE 325 (65 FE) MG PO TABS
325.0000 mg | ORAL_TABLET | Freq: Three times a day (TID) | ORAL | Status: DC
  Administered 2017-06-03 (×2): 325 mg via ORAL
  Filled 2017-06-03 (×2): qty 1

## 2017-06-03 MED ORDER — POTASSIUM CHLORIDE 2 MEQ/ML IV SOLN
80.0000 meq | INTRAVENOUS | Status: DC
  Filled 2017-06-03: qty 40

## 2017-06-03 MED ORDER — EPINEPHRINE PF 1 MG/ML IJ SOLN
0.0000 ug/min | INTRAVENOUS | Status: DC
  Filled 2017-06-03: qty 4

## 2017-06-03 MED ORDER — HYDROCODONE-ACETAMINOPHEN 5-325 MG PO TABS
1.0000 | ORAL_TABLET | Freq: Four times a day (QID) | ORAL | Status: DC | PRN
  Administered 2017-06-03: 1 via ORAL
  Filled 2017-06-03: qty 1

## 2017-06-03 MED ORDER — TRANEXAMIC ACID (OHS) BOLUS VIA INFUSION
15.0000 mg/kg | INTRAVENOUS | Status: AC
  Administered 2017-06-04: 1020 mg via INTRAVENOUS
  Filled 2017-06-03: qty 1020

## 2017-06-03 MED ORDER — TRANEXAMIC ACID (OHS) PUMP PRIME SOLUTION: 2.0000 mg/kg | INTRAVENOUS | Status: DC

## 2017-06-03 MED ORDER — NITROGLYCERIN IN D5W 200-5 MCG/ML-% IV SOLN: 2.0000 ug/min | INTRAVENOUS | Status: DC

## 2017-06-03 MED ORDER — CHLORHEXIDINE GLUCONATE 0.12 % MT SOLN
15.0000 mL | Freq: Once | OROMUCOSAL | Status: AC
  Administered 2017-06-04: 15 mL via OROMUCOSAL
  Filled 2017-06-03: qty 15

## 2017-06-03 MED ORDER — VANCOMYCIN HCL 10 G IV SOLR
1250.0000 mg | INTRAVENOUS | Status: AC
  Administered 2017-06-04: 1250 mg via INTRAVENOUS
  Filled 2017-06-03: qty 1250

## 2017-06-03 NOTE — Progress Notes (Signed)
BLOOD BANK CALLED, 3 UNITS OF PRBC ARE READY FOR PRE-OP.

## 2017-06-03 NOTE — Progress Notes (Signed)
PROGRESS NOTE    [name]  URK:[id number] DOB: [birth year] DOA: 05/24/2017 PCP: Sofie Rower, PA-C   Brief Narrative: Krystal Shaffer is a 41 y.o. female with medical history significant for recent E. coli UTI with failed outpatient management requiring short inpatient stay for antibiotic therapy who presents to the ED with continued fever, chills, night sweats, myalgias. She presented with sepsis concerning for endocarditis with new murmur.   Assessment & Plan:   Principal Problem:   Aortic valve endocarditis Active Problems:   Sepsis ([place])   Bacteremia due to Streptococcus   Diastolic murmur   Bacterial vaginosis   Atypical chest pain   Temporal headache   Streptococcal sepsis ([facility])   Right-sided chest pain   Sepsis Streptococcal viridans bacteremia -BCID positive for strep. 2/2 cultures (12/13) positive.  -continue ceftriaxone and gentamicin  -Dr. Enrique Sack performed dental extractions on 12/20; well tolerated -cardiology consulted for stratification prior to valve replacement; and work up demonstrated normal coronary artery and from their stand point clear to proceed with surgery w/o any further work up. -currently planning for AVR on Monday -follow any further rec's from ID  Aortic valve vegetation -New. With high concerns for underlying endocarditis. -TEE confirmed large vegetation  -Cardiothoracic surgery planning valve replacement next Monday -as pre-surgery evaluation/conditioning patient is S/P dental extractions by Dr. Enrique Sack on 12/20; overall well tolerated.  -cardiology has done coronary CT and results demonstrated no abnormalities -patient is good to proceed with surgery from cardiology stand point   History of UTI/Flank pain -Patient with under-treated UTI secondary to non-adherence secondary to nausea and vomiting prior to admission.  -Urine culture with no growth -patient is now denying dysuria and has remained afebrile. -currently  receiving rocephin for her endocarditis (which empirically with also treat underlying UTI)  Bacterial vaginosis -treated with flagyl  -no dysuria or vaginal discharge reported   Normocytic anemia -Iron studies in line with anemia of chronic disease -will monitor Hgb trend  -no transfusion needed  -Given subcutaneous B12, starting on oral iron and B complex treatment.  Headache -Patient reports improvement/resolution of her migraine with the use of Depakote. -Continue PRN fioricet  -will monitor and further assist as needed   Atypical chest pain -Most likely associated reflux and/or dysmotility.  -Following GI recommendations plan is for esophageal barium testing  -Continue as needed GI cocktail, PPI twice a day and analgesics. -Also empirically started on Diflucan. -Troponins. No ischemic changes on EKG and reassuring echocardiogram. -coronary CT has rule out ischemic cause for her discomfort.  -overall improved.   Mucous in her stools, diarrhea -Improve with the use of Florastor.    -most likely associated with prolonged antibiotics usage -No abdominal pain, no fever no chills.  Continue probiotics.  No Imodium, monitor for now  Right food/ankle with swelling, pain and mild erythema. -Acute and overnight in nature -High concerns for gout given presentation -Although on my evaluation the foot does not appear to be warm but per patient is exquisitely tender.  X-ray unremarkable other than mild soft tissue swelling.  Uric acid normal although an acute gout set up may not be elevated.  Continue current management.  DVT prophylaxis: Lovenox Code Status: Full code Family Communication: None at bedside Disposition Plan: Remains inpatient. S/P dental extractions on 12/20 and Planned next week AVR (currently planned for Monday). Continue IV antibiotics and follow further ID/CVTS rec's.  Consultants:   ID  Cardiology  Dental surgeon  CVTS  Procedures:   Echocardiogram  (12/14) - Left ventricle:  The cavity size was normal. Systolic function was   normal. The estimated ejection fraction was in the range of 60%   to 65%. Wall motion was normal; there were no regional wall   motion abnormalities. Doppler parameters are consistent with   abnormal left ventricular relaxation (grade 1 diastolic   dysfunction). - Aortic valve: Trileaflet; moderately thickened leaflets with 1cm   x 1cm mass on the right coronary cusp, mild mobility, suggestive   of vegetation. If no signs of infection, consider fibroelastoma.   There was severe stenosis. Valve area (VTI): 1.32 cm^2. Valve   area (Vmax): 1.41 cm^2. Valve area (Vmean): 1.28 cm^2.   TEE (12/18) - Left ventricle: The cavity size was normal. Wall thickness was   normal. Systolic function was normal. The estimated ejection   fraction was in the range of 60% to 65%. - Aortic valve: Functionally bicuspid; moderately thickened   leaflets. There was a vegetation. There was a large, 1.4 cm (L) x   0.7 cm (W), fixed vegetation on the noncoronary cusp. Another   vegetation is present at the tip of the fusion point of right and   left coronary cusp, 0.7 x 0.5cm. There was severe regurgitation. - Mitral valve: No evidence of vegetation. There was mild   regurgitation. - Left atrium: The atrium was dilated. No evidence of thrombus in   the appendage. - Right atrium: No evidence of thrombus in the atrial cavity or   appendage. - Atrial septum: No defect or patent foramen ovale was identified. - Tricuspid valve: No evidence of vegetation. - Pulmonic valve: No evidence of vegetation. - Superior vena cava: The study excluded a thrombus. - Pericardium, extracardiac: A trivial pericardial effusion was   identified.   Multiple dental extractions anticipated later today 12/20  Antimicrobials:  Ceftriaxone  Gentamicin   Diflucan    Subjective: No nausea no vomiting.  Complains about having right foot pain.  Later on  RN reported that the patient also complains about having some diarrhea.  Objective: Vitals:   06/02/17 1501 06/02/17 2014 06/03/17 0652 06/03/17 1500  BP: (!) 131/47 (!) 122/48 (!) 108/40 122/60  Pulse: 96 60 86 88  Resp: 18 18 18 18   Temp: 98.8 F (37.1 C) 98 F (36.7 C) 98.7 F (37.1 C) 98.2 F (36.8 C)  TempSrc: Oral Oral Oral Oral  SpO2: 100% 100% 99% 100%  Weight:      Height:        Intake/Output Summary (Last 24 hours) at 06/03/2017 1540 Last data filed at 06/03/2017 1500 Gross per 24 hour  Intake 410 ml  Output -  Net 410 ml   Filed Weights   05/24/17 1405 05/29/17 0730  Weight: 68 kg (150 lb) 68 kg (150 lb)    Examination: General exam: No chest pain, no shortness of breath, no headaches, no nausea or vomiting.  Patient reports right ankle pain/swelling and mild redness.  No fever. Respiratory system: Clear to auscultation bilaterally, normal respiratory effort.    Cardiovascular system: Positive systolic ejection murmur, regular rate and rhythm, no JVD, no rubs, no gallops. Gastrointestinal system: Soft, nontender, nondistended, positive bowel sounds. Central nervous system/psychiatry: Cranial nerves intact, no focal neurological motor deficit on exam, normal judgment and insight.  Patient is alert, awake and oriented x3. Extremities: No cyanosis, no clubbing, right ankle/volar surface with tenderness on palpation, decreased range of motion positive swelling and mild erythema.   Skin: Very mild erythema in the volar aspect of her right foot.  No open ulcers, no bruises, no petechiae, no other abnormalities.   Data Reviewed: I have personally reviewed following labs and imaging studies  Basic Metabolic Panel: Recent Labs  Lab 05/30/17 0424 06/01/17 0457  NA 134* 134*  K 4.1 3.9  CL 102 101  CO2 23 25  GLUCOSE 82 82  BUN 9 7  CREATININE 0.79 0.74  CALCIUM 8.6* 8.8*   GFR: Estimated Creatinine Clearance: 88.5 mL/min (by C-G formula based on SCr of 0.74  mg/dL).   Cardiac Enzymes: Recent Labs  Lab 05/28/17 1154  TROPONINI <0.03    Recent Results (from the past 240 hour(s))  Urine culture     Status: None   Collection Time: 05/24/17  6:06 PM  Result Value Ref Range Status   Specimen Description URINE, RANDOM  Final   Special Requests NONE  Final   Culture NO GROWTH  Final   Report Status 05/26/2017 FINAL  Final  Culture, blood (routine x 2)     Status: Abnormal   Collection Time: 05/24/17 11:05 PM  Result Value Ref Range Status   Specimen Description BLOOD RIGHT ANTECUBITAL  Final   Special Requests   Final    BOTTLES DRAWN AEROBIC AND ANAEROBIC Blood Culture adequate volume   Culture  Setup Time   Final    GRAM POSITIVE COCCI IN CHAINS IN BOTH AEROBIC AND ANAEROBIC BOTTLES CRITICAL VALUE NOTED.  VALUE IS CONSISTENT WITH PREVIOUSLY REPORTED AND CALLED VALUE.    Culture (A)  Final    VIRIDANS STREPTOCOCCUS SUSCEPTIBILITIES PERFORMED ON PREVIOUS CULTURE WITHIN THE LAST 5 DAYS.    Report Status 05/27/2017 FINAL  Final  Culture, blood (routine x 2)     Status: Abnormal   Collection Time: 05/24/17 11:14 PM  Result Value Ref Range Status   Specimen Description BLOOD LEFT WRIST  Final   Special Requests   Final    BOTTLES DRAWN AEROBIC AND ANAEROBIC Blood Culture adequate volume   Culture  Setup Time   Final    GRAM POSITIVE COCCI IN CHAINS IN BOTH AEROBIC AND ANAEROBIC BOTTLES CRITICAL RESULT CALLED TO, READ BACK BY AND VERIFIED WITH: JNARKLE,PHARMD @2115  05/25/17 BY LHOWARD    Culture VIRIDANS STREPTOCOCCUS (A)  Final   Report Status 05/27/2017 FINAL  Final   Organism ID, Bacteria VIRIDANS STREPTOCOCCUS  Final      Susceptibility   Viridans streptococcus - MIC*    PENICILLIN 1 INTERMEDIATE Intermediate     CEFTRIAXONE 1 SENSITIVE Sensitive     ERYTHROMYCIN <=0.12 SENSITIVE Sensitive     LEVOFLOXACIN 1 SENSITIVE Sensitive     VANCOMYCIN 0.5 SENSITIVE Sensitive     * VIRIDANS STREPTOCOCCUS  Blood Culture ID Panel  (Reflexed)     Status: Abnormal   Collection Time: 05/24/17 11:14 PM  Result Value Ref Range Status   Enterococcus species NOT DETECTED NOT DETECTED Final   Listeria monocytogenes NOT DETECTED NOT DETECTED Final   Staphylococcus species NOT DETECTED NOT DETECTED Final   Staphylococcus aureus NOT DETECTED NOT DETECTED Final   Streptococcus species DETECTED (A) NOT DETECTED Final    Comment: Not Enterococcus species, Streptococcus agalactiae, Streptococcus pyogenes, or Streptococcus pneumoniae. CRITICAL RESULT CALLED TO, READ BACK BY AND VERIFIED WITH: JNARKLE,PHARMD @2115  05/25/17 BY LHOWARD    Streptococcus agalactiae NOT DETECTED NOT DETECTED Final   Streptococcus pneumoniae NOT DETECTED NOT DETECTED Final   Streptococcus pyogenes NOT DETECTED NOT DETECTED Final   Acinetobacter baumannii NOT DETECTED NOT DETECTED Final   Enterobacteriaceae species NOT DETECTED NOT  DETECTED Final   Enterobacter cloacae complex NOT DETECTED NOT DETECTED Final   Escherichia coli NOT DETECTED NOT DETECTED Final   Klebsiella oxytoca NOT DETECTED NOT DETECTED Final   Klebsiella pneumoniae NOT DETECTED NOT DETECTED Final   Proteus species NOT DETECTED NOT DETECTED Final   Serratia marcescens NOT DETECTED NOT DETECTED Final   Haemophilus influenzae NOT DETECTED NOT DETECTED Final   Neisseria meningitidis NOT DETECTED NOT DETECTED Final   Pseudomonas aeruginosa NOT DETECTED NOT DETECTED Final   Candida albicans NOT DETECTED NOT DETECTED Final   Candida glabrata NOT DETECTED NOT DETECTED Final   Candida krusei NOT DETECTED NOT DETECTED Final   Candida parapsilosis NOT DETECTED NOT DETECTED Final   Candida tropicalis NOT DETECTED NOT DETECTED Final  Culture, blood (Routine X 2) w Reflex to ID Panel     Status: None   Collection Time: 05/25/17  3:25 PM  Result Value Ref Range Status   Specimen Description BLOOD BLOOD RIGHT HAND  Final   Special Requests IN PEDIATRIC BOTTLE Blood Culture adequate volume  Final    Culture NO GROWTH 5 DAYS  Final   Report Status 05/30/2017 FINAL  Final  Culture, blood (routine x 2)     Status: None   Collection Time: 05/26/17 12:20 AM  Result Value Ref Range Status   Specimen Description BLOOD LEFT ANTECUBITAL  Final   Special Requests   Final    BOTTLES DRAWN AEROBIC AND ANAEROBIC Blood Culture adequate volume   Culture NO GROWTH 5 DAYS  Final   Report Status 05/31/2017 FINAL  Final  Culture, blood (routine x 2)     Status: None   Collection Time: 05/26/17 12:22 AM  Result Value Ref Range Status   Specimen Description BLOOD LEFT HAND  Final   Special Requests   Final    BOTTLES DRAWN AEROBIC ONLY Blood Culture adequate volume   Culture NO GROWTH 5 DAYS  Final   Report Status 05/31/2017 FINAL  Final  Surgical pcr screen     Status: None   Collection Time: 05/31/17  3:21 AM  Result Value Ref Range Status   MRSA, PCR NEGATIVE NEGATIVE Final   Staphylococcus aureus NEGATIVE NEGATIVE Final    Comment: (NOTE) The Xpert SA Assay (FDA approved for NASAL specimens in patients 46 years of age and older), is one component of a comprehensive surveillance program. It is not intended to diagnose infection nor to guide or monitor treatment.   Surgical pcr screen     Status: None   Collection Time: 06/01/17  4:56 PM  Result Value Ref Range Status   MRSA, PCR NEGATIVE NEGATIVE Final   Staphylococcus aureus NEGATIVE NEGATIVE Final    Comment: (NOTE) The Xpert SA Assay (FDA approved for NASAL specimens in patients 62 years of age and older), is one component of a comprehensive surveillance program. It is not intended to diagnose infection nor to guide or monitor treatment.     Radiology Studies: Dg Chest 2 View  Result Date: 06/02/2017 CLINICAL DATA:  Preop for aortic valve replacement. EXAM: CHEST  2 VIEW COMPARISON:  Radiographs of May 24, 2017. FINDINGS: Stable cardiomegaly. No pneumothorax or pleural effusion is noted. Interval placement of right-sided  PICC line with distal tip in expected position of the SVC. Both lungs are clear. The visualized skeletal structures are unremarkable. IMPRESSION: Stable cardiomegaly. Interval placement of right-sided PICC line. No other abnormality seen in the chest. Electronically Signed   By: Marijo Conception, M.D.  On: 06/02/2017 10:33   Dg Foot Complete Right  Result Date: 06/03/2017 CLINICAL DATA:  Dorsal right foot pain especially with weight-bearing or moving of the toes. Gout. EXAM: RIGHT FOOT COMPLETE - 3+ VIEW COMPARISON:  07/12/2009 ankle radiographs FINDINGS: 4 mm central diaphyseal lesion of the proximal phalanx third toe with peripheral sclerosis and central lucency. 3 mm lucent lesion in the distal metadiaphysis of the proximal phalanx of the small toe. Neither of these is marginal to an articular surface. No malalignment at the Lisfranc joint. No erosion along the first MTP joint or definite other periarticular erosion in the forefoot. Dorsal subcutaneous edema along the forefoot. IMPRESSION: 1. Dorsal subcutaneous edema along the forefoot, but without articular or periarticular erosion. 2. Small lucent lesions in the proximal phalanges of the third and fifth toes. The third toe proximal phalanx lesion has some marginal sclerosis and is most likely to be a incidental benign lesion such as a enchondroma, hemangioma, or old nonossifying fibroma. These tiny fifth toe proximal phalanx jail lucent lesion likewise has benign imaging characteristics and is likely incidental. Electronically Signed   By: Van Clines M.D.   On: 06/03/2017 12:05    Scheduled Meds: . acidophilus  1 capsule Oral Daily  . B-complex with vitamin C  1 tablet Oral Daily  . bisacodyl  5 mg Oral Once  . chlorhexidine  60 mL Topical Once   And  . [START ON 06/04/2017] chlorhexidine  60 mL Topical Once  . [START ON 06/04/2017] chlorhexidine  15 mL Mouth/Throat Once  . [START ON 06/04/2017] diazepam  2 mg Oral Once  . feeding  supplement (ENSURE ENLIVE)  237 mL Oral TID WC  . ferrous sulfate  325 mg Oral TID WC  . fluconazole  200 mg Oral Daily  . [START ON 06/04/2017] heparin-papaverine-plasmalyte irrigation   Irrigation To OR  . indomethacin  50 mg Oral TID WC  . [START ON 06/04/2017] magnesium sulfate  40 mEq Other To OR  . [START ON 06/04/2017] metoprolol tartrate  12.5 mg Oral Once  . metoprolol tartrate  25 mg Oral BID  . pantoprazole  40 mg Oral BID AC  . [START ON 06/04/2017] potassium chloride  80 mEq Other To OR  . saccharomyces boulardii  250 mg Oral BID  . sucralfate  1 g Oral TID PC & HS  . [START ON 06/04/2017] tranexamic acid  15 mg/kg Intravenous To OR  . [START ON 06/04/2017] tranexamic acid  2 mg/kg Intracatheter To OR   Continuous Infusions: . cefTRIAXone (ROCEPHIN)  IV Stopped (06/02/17 2259)  . [START ON 06/04/2017] cefUROXime (ZINACEF)  IV    . [START ON 06/04/2017] cefUROXime (ZINACEF)  IV    . [START ON 06/04/2017] dexmedetomidine    . [START ON 06/04/2017] DOPamine    . [START ON 06/04/2017] epinephrine    . gentamicin Stopped (06/02/17 1747)  . [START ON 06/04/2017] heparin 30,000 units/NS 1000 mL solution for CELLSAVER    . [START ON 06/04/2017] insulin (NOVOLIN-R) infusion    . lactated ringers    . [START ON 06/04/2017] nitroGLYCERIN    . [START ON 06/04/2017] phenylephrine 20mg /227mL NS (0.08mg /ml) infusion    . [START ON 06/04/2017] tranexamic acid (CYKLOKAPRON) infusion ([facility])    . [START ON 06/04/2017] vancomycin       LOS: 8 days    Berle Mull, MD [facility] 06/03/2017, 3:40 PM Pager: [phone]  If 7PM-7AM, please contact night-coverage [email or web address] Password [facility] 06/03/2017, 3:40 PM

## 2017-06-03 NOTE — Progress Notes (Signed)
Pharmacy Antibiotic Note  Krystal Shaffer is a 41 y.o. female admitted on 05/24/2017 with fever, chills, myalgias and found to have strep bacteremia and aortic valve endocarditis. Pharmacy has been consulted for Rocephin and Gentamicin dosing.  Gentamicin trough at steady state <0.5, which is appropriate for synergy dosing. WBC wnl. Afebrile. Patient scheduled for [facility] in AM 12/24.   Plan: Continue Ceftriaxone 2g q 24 hrs - planning x 4 weeks AFTER valve replacement Continue Gentamicin 3mg /kg q 24 hrs (= 200 mg q 24 hrs) - planning x 2 weeks AFTER valve replacement  Will enter OPAT orders post-op when renal function stable  Height: 5\' 4"  (162.6 cm) Weight: 150 lb (68 kg) IBW/kg (Calculated) : 54.7  Temp (24hrs), Avg:98.5 F (36.9 C), Min:98 F (36.7 C), Max:98.8 F (37.1 C)  Recent Labs  Lab 05/30/17 0424 05/31/17 1648 06/01/17 0457 06/02/17 0458  WBC  --   --   --  9.1  CREATININE 0.79  --  0.74  --   GENTTROUGH  --  <0.5*  --   --     Estimated Creatinine Clearance: 88.5 mL/min (by C-G formula based on SCr of 0.74 mg/dL).    No Known Allergies  Antimicrobials this admission: Ceftriaxone 12/13>>  Gentamicin 12/17>>  Fluconazole (vaginosis) 12/16 >> (12/29)  Dose adjustments this admission: none  Microbiology results: 12/19 MRSA pcr: neg 12/15 BCx x 2: ngtd 12/14 BCx x 2: ngtd 12/13 BCx x 2: viridans streptococcus- I: PCN; S: CTX 12/13 UCx: neg  Thank you for allowing pharmacy to be a part of this patient's care.  Sloan Leiter, PharmD, BCPS, BCCCP Clinical Pharmacist Clinical phone 06/03/2017 until 3:30PM [phone] After hours, please call [phone] 06/03/2017 12:45 PM

## 2017-06-03 NOTE — Anesthesia Preprocedure Evaluation (Addendum)
Anesthesia Evaluation  Patient identified by MRN, date of birth, ID band Patient awake    Reviewed: Allergy & Precautions, NPO status , Patient's Chart, lab work & pertinent test results  History of Anesthesia Complications Negative for: history of anesthetic complications  Airway Mallampati: II  TM Distance: >3 FB Neck ROM: Full    Dental  (+) Dental Advisory Given, Poor Dentition   Pulmonary neg pulmonary ROS,    breath sounds clear to auscultation       Cardiovascular + Valvular Problems/Murmurs AS  Rhythm:Regular Rate:Normal     Neuro/Psych  Headaches, negative psych ROS   GI/Hepatic negative GI ROS, Neg liver ROS,   Endo/Other  negative endocrine ROS  Renal/GU   negative genitourinary   Musculoskeletal negative musculoskeletal ROS (+)   Abdominal Normal abdominal exam  (+)   Peds  Hematology negative hematology ROS (+)   Anesthesia Other Findings   Reproductive/Obstetrics negative OB ROS                            Lab Results  Component Value Date   WBC 9.1 06/02/2017   HGB 8.8 (L) 06/02/2017   HCT 28.0 (L) 06/02/2017   MCV 82.4 06/02/2017   PLT 251 06/02/2017   Lab Results  Component Value Date   CREATININE 0.74 06/01/2017   BUN 7 06/01/2017   NA 134 (L) 06/01/2017   K 3.9 06/01/2017   CL 101 06/01/2017   CO2 25 06/01/2017   Lab Results  Component Value Date   INR 1.18 06/02/2017    Echo: - Left ventricle: The cavity size was normal. Wall thickness was   normal. Systolic function was normal. The estimated ejection   fraction was in the range of 60% to 65%. - Aortic valve: Functionally bicuspid; moderately thickened   leaflets. There was a vegetation. There was a large, 1.4 cm (L) x   0.7 cm (W), fixed vegetation on the noncoronary cusp. Another   vegetation is present at the tip of the fusion point of right and   left coronary cusp, 0.7 x 0.5cm. There was severe  regurgitation. - Mitral valve: No evidence of vegetation. There was mild   regurgitation. - Left atrium: The atrium was dilated. No evidence of thrombus in   the appendage. - Right atrium: No evidence of thrombus in the atrial cavity or   appendage. - Atrial septum: No defect or patent foramen ovale was identified. - Tricuspid valve: No evidence of vegetation. - Pulmonic valve: No evidence of vegetation. - Superior vena cava: The study excluded a thrombus. - Pericardium, extracardiac: A trivial pericardial effusion was   identified.  Anesthesia Physical  Anesthesia Plan  ASA: III  Anesthesia Plan: General   Post-op Pain Management:    Induction: Intravenous  PONV Risk Score and Plan: 4 or greater and Ondansetron, Dexamethasone and Treatment may vary due to age or medical condition  Airway Management Planned: Oral ETT  Additional Equipment: None, Arterial line, PA Cath, TEE and Ultrasound Guidance Line Placement  Intra-op Plan:   Post-operative Plan: Post-operative intubation/ventilation  Informed Consent: I have reviewed the patients History and Physical, chart, labs and discussed the procedure including the risks, benefits and alternatives for the proposed anesthesia with the patient or authorized representative who has indicated his/her understanding and acceptance.   Dental advisory given  Plan Discussed with: CRNA, Anesthesiologist and Surgeon  Anesthesia Plan Comments:        Anesthesia Quick  Evaluation

## 2017-06-04 ENCOUNTER — Encounter (HOSPITAL_COMMUNITY)
Admission: EM | Disposition: A | Discharge status: Home Health | Payer: Self-pay | Source: Home / Self Care | Attending: Cardiothoracic Surgery

## 2017-06-04 ENCOUNTER — Encounter (HOSPITAL_COMMUNITY): Payer: Self-pay | Admitting: Certified Registered"

## 2017-06-04 ENCOUNTER — Inpatient Hospital Stay (HOSPITAL_COMMUNITY): Payer: 59 | Admitting: Certified Registered Nurse Anesthetist

## 2017-06-04 ENCOUNTER — Inpatient Hospital Stay (HOSPITAL_COMMUNITY): Payer: 59

## 2017-06-04 DIAGNOSIS — I38 Endocarditis, valve unspecified: Secondary | ICD-10-CM | POA: Diagnosis present

## 2017-06-04 HISTORY — PX: TEE WITHOUT CARDIOVERSION: SHX5443

## 2017-06-04 HISTORY — PX: AORTIC VALVE REPLACEMENT: SHX41

## 2017-06-04 LAB — POCT I-STAT, CHEM 8
BUN: 14 mg/dL (ref 6–20)
BUN: 12 mg/dL (ref 6–20)
BUN: 13 mg/dL (ref 6–20)
BUN: 13 mg/dL (ref 6–20)
BUN: 11 mg/dL (ref 6–20)
BUN: 9 mg/dL (ref 6–20)
CALCIUM ION: 0.99 mmol/L — AB (ref 1.15–1.40)
CALCIUM ION: 1.03 mmol/L — AB (ref 1.15–1.40)
CHLORIDE: 101 mmol/L (ref 101–111)
CHLORIDE: 101 mmol/L (ref 101–111)
CHLORIDE: 102 mmol/L (ref 101–111)
CREATININE: 0.5 mg/dL (ref 0.44–1.00)
CREATININE: 0.4 mg/dL — AB (ref 0.44–1.00)
CREATININE: 0.5 mg/dL (ref 0.44–1.00)
Calcium, Ion: 1.14 mmol/L — ABNORMAL LOW (ref 1.15–1.40)
Calcium, Ion: 1.14 mmol/L — ABNORMAL LOW (ref 1.15–1.40)
Calcium, Ion: 1.05 mmol/L — ABNORMAL LOW (ref 1.15–1.40)
Calcium, Ion: 1.1 mmol/L — ABNORMAL LOW (ref 1.15–1.40)
Chloride: 102 mmol/L (ref 101–111)
Chloride: 103 mmol/L (ref 101–111)
Chloride: 103 mmol/L (ref 101–111)
Creatinine, Ser: 0.6 mg/dL (ref 0.44–1.00)
Creatinine, Ser: 0.4 mg/dL — ABNORMAL LOW (ref 0.44–1.00)
Creatinine, Ser: 0.5 mg/dL (ref 0.44–1.00)
GLUCOSE: 120 mg/dL — AB (ref 65–99)
GLUCOSE: 127 mg/dL — AB (ref 65–99)
GLUCOSE: 159 mg/dL — AB (ref 65–99)
Glucose, Bld: 91 mg/dL (ref 65–99)
Glucose, Bld: 103 mg/dL — ABNORMAL HIGH (ref 65–99)
Glucose, Bld: 130 mg/dL — ABNORMAL HIGH (ref 65–99)
HCT: 25 % — ABNORMAL LOW (ref 36.0–46.0)
HCT: 27 % — ABNORMAL LOW (ref 36.0–46.0)
HCT: 28 % — ABNORMAL LOW (ref 36.0–46.0)
HCT: 31 % — ABNORMAL LOW (ref 36.0–46.0)
HEMATOCRIT: 24 % — AB (ref 36.0–46.0)
HEMATOCRIT: 24 % — AB (ref 36.0–46.0)
HEMOGLOBIN: 10.5 g/dL — AB (ref 12.0–15.0)
Hemoglobin: 8.5 g/dL — ABNORMAL LOW (ref 12.0–15.0)
Hemoglobin: 8.2 g/dL — ABNORMAL LOW (ref 12.0–15.0)
Hemoglobin: 9.2 g/dL — ABNORMAL LOW (ref 12.0–15.0)
Hemoglobin: 8.2 g/dL — ABNORMAL LOW (ref 12.0–15.0)
Hemoglobin: 9.5 g/dL — ABNORMAL LOW (ref 12.0–15.0)
POTASSIUM: 3.4 mmol/L — AB (ref 3.5–5.1)
POTASSIUM: 3.3 mmol/L — AB (ref 3.5–5.1)
POTASSIUM: 4.5 mmol/L (ref 3.5–5.1)
Potassium: 4.3 mmol/L (ref 3.5–5.1)
Potassium: 3.8 mmol/L (ref 3.5–5.1)
Potassium: 3.5 mmol/L (ref 3.5–5.1)
SODIUM: 137 mmol/L (ref 135–145)
SODIUM: 138 mmol/L (ref 135–145)
SODIUM: 138 mmol/L (ref 135–145)
SODIUM: 140 mmol/L (ref 135–145)
Sodium: 139 mmol/L (ref 135–145)
Sodium: 137 mmol/L (ref 135–145)
TCO2: 29 mmol/L (ref 22–32)
TCO2: 26 mmol/L (ref 22–32)
TCO2: 30 mmol/L (ref 22–32)
TCO2: 25 mmol/L (ref 22–32)
TCO2: 24 mmol/L (ref 22–32)
TCO2: 23 mmol/L (ref 22–32)

## 2017-06-04 LAB — POCT I-STAT 3, ART BLOOD GAS (G3+)
ACID-BASE DEFICIT: 2 mmol/L (ref 0.0–2.0)
ACID-BASE DEFICIT: 2 mmol/L (ref 0.0–2.0)
ACID-BASE DEFICIT: 3 mmol/L — AB (ref 0.0–2.0)
Acid-Base Excess: 1 mmol/L (ref 0.0–2.0)
Acid-base deficit: 3 mmol/L — ABNORMAL HIGH (ref 0.0–2.0)
BICARBONATE: 22.5 mmol/L (ref 20.0–28.0)
BICARBONATE: 22.5 mmol/L (ref 20.0–28.0)
Bicarbonate: 25.5 mmol/L (ref 20.0–28.0)
Bicarbonate: 23.6 mmol/L (ref 20.0–28.0)
Bicarbonate: 23.4 mmol/L (ref 20.0–28.0)
O2 SAT: 100 %
O2 SAT: 100 %
O2 SAT: 98 %
O2 SAT: 98 %
O2 SAT: 98 %
PCO2 ART: 37.1 mmHg (ref 32.0–48.0)
PCO2 ART: 40.8 mmHg (ref 32.0–48.0)
PCO2 ART: 43.1 mmHg (ref 32.0–48.0)
PO2 ART: 482 mmHg — AB (ref 83.0–108.0)
PO2 ART: 412 mmHg — AB (ref 83.0–108.0)
PO2 ART: 124 mmHg — AB (ref 83.0–108.0)
Patient temperature: 36.3
Patient temperature: 37
TCO2: 27 mmol/L (ref 22–32)
TCO2: 25 mmol/L (ref 22–32)
TCO2: 25 mmol/L (ref 22–32)
TCO2: 24 mmol/L (ref 22–32)
TCO2: 24 mmol/L (ref 22–32)
pCO2 arterial: 40.7 mmHg (ref 32.0–48.0)
pCO2 arterial: 43.1 mmHg (ref 32.0–48.0)
pH, Arterial: 7.445 (ref 7.350–7.450)
pH, Arterial: 7.371 (ref 7.350–7.450)
pH, Arterial: 7.364 (ref 7.350–7.450)
pH, Arterial: 7.324 — ABNORMAL LOW (ref 7.350–7.450)
pH, Arterial: 7.325 — ABNORMAL LOW (ref 7.350–7.450)
pO2, Arterial: 104 mmHg (ref 83.0–108.0)
pO2, Arterial: 112 mmHg — ABNORMAL HIGH (ref 83.0–108.0)

## 2017-06-04 LAB — GLUCOSE, CAPILLARY
GLUCOSE-CAPILLARY: 107 mg/dL — AB (ref 65–99)
GLUCOSE-CAPILLARY: 110 mg/dL — AB (ref 65–99)
Glucose-Capillary: 81 mg/dL (ref 65–99)
Glucose-Capillary: 113 mg/dL — ABNORMAL HIGH (ref 65–99)
Glucose-Capillary: 111 mg/dL — ABNORMAL HIGH (ref 65–99)
Glucose-Capillary: 122 mg/dL — ABNORMAL HIGH (ref 65–99)
Glucose-Capillary: 134 mg/dL — ABNORMAL HIGH (ref 65–99)

## 2017-06-04 LAB — ECHO TEE
AO mean calculated velocity dopler: 166 cm/s
AV Area VTI index: 0.88 cm2/m2
AV Area VTI: 1.83 cm2
AV Area mean vel: 1.87 cm2
AV Mean grad: 13 mmHg
AV Peak grad: 24 mmHg
AV VEL mean LVOT/AV: 0.54
AV area mean vel ind: 1.09 cm2/m2
AV peak Index: 1.07
AV pk vel: 244 cm/s
AV vel: 1.51
Ao pk vel: 0.53 m/s
LVOT SV: 86 mL
LVOT VTI: 24.9 cm
LVOT area: 3.46 cm2
LVOT diameter: 21 mm
LVOT peak VTI: 0.44 cm
LVOT peak grad rest: 7 mmHg
LVOT peak vel: 129 cm/s
P 1/2 time: 214 ms
VTI: 57.2 cm
Valve area index: 0.88
Valve area: 1.51 cm2

## 2017-06-04 LAB — POCT I-STAT 4, (NA,K, GLUC, HGB,HCT)
GLUCOSE: 109 mg/dL — AB (ref 65–99)
HEMATOCRIT: 29 % — AB (ref 36.0–46.0)
Hemoglobin: 9.9 g/dL — ABNORMAL LOW (ref 12.0–15.0)
Potassium: 4 mmol/L (ref 3.5–5.1)
SODIUM: 142 mmol/L (ref 135–145)

## 2017-06-04 LAB — BASIC METABOLIC PANEL
ANION GAP: 6 (ref 5–15)
BUN: 16 mg/dL (ref 6–20)
CHLORIDE: 102 mmol/L (ref 101–111)
CO2: 28 mmol/L (ref 22–32)
CREATININE: 0.67 mg/dL (ref 0.44–1.00)
Calcium: 8.1 mg/dL — ABNORMAL LOW (ref 8.9–10.3)
GFR calc non Af Amer: >60 mL/min (ref >60)
Glucose, Bld: 92 mg/dL (ref 65–99)
POTASSIUM: 3.6 mmol/L (ref 3.5–5.1)
SODIUM: 136 mmol/L (ref 135–145)

## 2017-06-04 LAB — CBC
HCT: 26.2 % — ABNORMAL LOW (ref 36.0–46.0)
HCT: 29.9 % — ABNORMAL LOW (ref 36.0–46.0)
HEMATOCRIT: 26.7 % — AB (ref 36.0–46.0)
HEMOGLOBIN: 8.7 g/dL — AB (ref 12.0–15.0)
Hemoglobin: 8.2 g/dL — ABNORMAL LOW (ref 12.0–15.0)
Hemoglobin: 9.7 g/dL — ABNORMAL LOW (ref 12.0–15.0)
MCH: 25.8 pg — ABNORMAL LOW (ref 26.0–34.0)
MCH: 27.3 pg (ref 26.0–34.0)
MCH: 27.2 pg (ref 26.0–34.0)
MCHC: 31.3 g/dL (ref 30.0–36.0)
MCHC: 32.6 g/dL (ref 30.0–36.0)
MCHC: 32.4 g/dL (ref 30.0–36.0)
MCV: 82.4 fL (ref 78.0–100.0)
MCV: 83.7 fL (ref 78.0–100.0)
MCV: 84 fL (ref 78.0–100.0)
Platelets: 242 10*3/uL (ref 150–400)
Platelets: 120 10*3/uL — ABNORMAL LOW (ref 150–400)
Platelets: 149 10*3/uL — ABNORMAL LOW (ref 150–400)
RBC: 3.18 MIL/uL — ABNORMAL LOW (ref 3.87–5.11)
RBC: 3.19 MIL/uL — AB (ref 3.87–5.11)
RBC: 3.56 MIL/uL — ABNORMAL LOW (ref 3.87–5.11)
RDW: 14.9 % (ref 11.5–15.5)
RDW: 14.6 % (ref 11.5–15.5)
RDW: 14.7 % (ref 11.5–15.5)
WBC: 7.1 10*3/uL (ref 4.0–10.5)
WBC: 10 10*3/uL (ref 4.0–10.5)
WBC: 10.8 10*3/uL — ABNORMAL HIGH (ref 4.0–10.5)

## 2017-06-04 LAB — PREPARE RBC (CROSSMATCH)

## 2017-06-04 LAB — PLATELET COUNT: Platelets: 138 10*3/uL — ABNORMAL LOW (ref 150–400)

## 2017-06-04 LAB — APTT: APTT: 30 s (ref 24–36)

## 2017-06-04 LAB — PROTIME-INR
INR: 1.64
Prothrombin Time: 19.3 seconds — ABNORMAL HIGH (ref 11.4–15.2)

## 2017-06-04 LAB — HEMOGLOBIN AND HEMATOCRIT, BLOOD
HCT: 21.9 % — ABNORMAL LOW (ref 36.0–46.0)
Hemoglobin: 7 g/dL — ABNORMAL LOW (ref 12.0–15.0)

## 2017-06-04 LAB — CREATININE, SERUM
Creatinine, Ser: 0.59 mg/dL (ref 0.44–1.00)
GFR calc Af Amer: >60 mL/min (ref >60)
GFR calc non Af Amer: >60 mL/min (ref >60)

## 2017-06-04 LAB — MAGNESIUM: Magnesium: 2.7 mg/dL — ABNORMAL HIGH (ref 1.7–2.4)

## 2017-06-04 SURGERY — REPLACEMENT, AORTIC VALVE, OPEN
Anesthesia: General | Site: Chest

## 2017-06-04 MED ORDER — DEXMEDETOMIDINE HCL 200 MCG/2ML IV SOLN
INTRAVENOUS | Status: DC | PRN
  Administered 2017-06-04: 0.4 ug/kg/h via INTRAVENOUS

## 2017-06-04 MED ORDER — ASPIRIN 81 MG PO CHEW: 324.0000 mg | CHEWABLE_TABLET | Freq: Every day | ORAL | Status: DC

## 2017-06-04 MED ORDER — ALBUMIN HUMAN 5 % IV SOLN
250.0000 mL | INTRAVENOUS | Status: AC | PRN
  Administered 2017-06-04: 250 mL via INTRAVENOUS

## 2017-06-04 MED ORDER — DEXAMETHASONE SODIUM PHOSPHATE 10 MG/ML IJ SOLN
INTRAMUSCULAR | Status: DC | PRN
  Administered 2017-06-04: 10 mg via INTRAVENOUS

## 2017-06-04 MED ORDER — FENTANYL CITRATE (PF) 250 MCG/5ML IJ SOLN
INTRAMUSCULAR | Status: AC
  Filled 2017-06-04: qty 5

## 2017-06-04 MED ORDER — ACETAMINOPHEN 160 MG/5ML PO SOLN: 650.0000 mg | Freq: Once | ORAL | Status: AC

## 2017-06-04 MED ORDER — NITROGLYCERIN IN D5W 200-5 MCG/ML-% IV SOLN
0.0000 ug/min | INTRAVENOUS | Status: DC
  Administered 2017-06-05: 80 ug/min via INTRAVENOUS
  Filled 2017-06-04: qty 250

## 2017-06-04 MED ORDER — HEPARIN SODIUM (PORCINE) 1000 UNIT/ML IJ SOLN
INTRAMUSCULAR | Status: AC
  Filled 2017-06-04: qty 1

## 2017-06-04 MED ORDER — METOPROLOL TARTRATE 5 MG/5ML IV SOLN
2.5000 mg | INTRAVENOUS | Status: DC | PRN
  Administered 2017-06-04 (×2): 5 mg via INTRAVENOUS
  Filled 2017-06-04: qty 5

## 2017-06-04 MED ORDER — ACETAMINOPHEN 500 MG PO TABS
1000.0000 mg | ORAL_TABLET | Freq: Four times a day (QID) | ORAL | Status: DC
  Administered 2017-06-04 – 2017-06-09 (×11): 1000 mg via ORAL
  Filled 2017-06-04 (×13): qty 2

## 2017-06-04 MED ORDER — MAGNESIUM SULFATE 4 GM/100ML IV SOLN
4.0000 g | Freq: Once | INTRAVENOUS | Status: AC
  Administered 2017-06-04: 4 g via INTRAVENOUS

## 2017-06-04 MED ORDER — PROPOFOL 10 MG/ML IV BOLUS
INTRAVENOUS | Status: AC
  Filled 2017-06-04: qty 20

## 2017-06-04 MED ORDER — INSULIN ASPART 100 UNIT/ML ~~LOC~~ SOLN
0.0000 [IU] | SUBCUTANEOUS | Status: DC
  Administered 2017-06-04 (×2): 2 [IU] via SUBCUTANEOUS

## 2017-06-04 MED ORDER — SODIUM CHLORIDE 0.9% FLUSH
3.0000 mL | Freq: Two times a day (BID) | INTRAVENOUS | Status: DC
  Administered 2017-06-05: 3 mL via INTRAVENOUS

## 2017-06-04 MED ORDER — SODIUM CHLORIDE 0.9 % IV SOLN: 250.0000 mL | INTRAVENOUS | Status: DC

## 2017-06-04 MED ORDER — SODIUM CHLORIDE 0.45 % IV SOLN
INTRAVENOUS | Status: DC | PRN
  Administered 2017-06-04: 20 mL/h via INTRAVENOUS

## 2017-06-04 MED ORDER — ONDANSETRON HCL 4 MG/2ML IJ SOLN
INTRAMUSCULAR | Status: AC
  Filled 2017-06-04: qty 2

## 2017-06-04 MED ORDER — DOCUSATE SODIUM 100 MG PO CAPS
200.0000 mg | ORAL_CAPSULE | Freq: Every day | ORAL | Status: DC
  Administered 2017-06-05 – 2017-06-06 (×2): 200 mg via ORAL
  Filled 2017-06-04 (×3): qty 2

## 2017-06-04 MED ORDER — ACETAMINOPHEN 160 MG/5ML PO SOLN: 1000.0000 mg | Freq: Four times a day (QID) | ORAL | Status: DC

## 2017-06-04 MED ORDER — SODIUM CHLORIDE 0.9 % IV SOLN
INTRAVENOUS | Status: DC | PRN
  Administered 2017-06-04: .6 [IU]/h via INTRAVENOUS

## 2017-06-04 MED ORDER — SODIUM CHLORIDE 0.9% FLUSH: 3.0000 mL | INTRAVENOUS | Status: DC | PRN

## 2017-06-04 MED ORDER — TRANEXAMIC ACID 1000 MG/10ML IV SOLN
INTRAVENOUS | Status: DC | PRN
  Administered 2017-06-04: 1.5 mg/kg/h via INTRAVENOUS

## 2017-06-04 MED ORDER — MAGNESIUM SULFATE 4 GM/100ML IV SOLN
INTRAVENOUS | Status: AC
  Filled 2017-06-04: qty 100

## 2017-06-04 MED ORDER — PHENYLEPHRINE HCL 10 MG/ML IJ SOLN
0.0000 ug/min | INTRAMUSCULAR | Status: DC
  Filled 2017-06-04: qty 2

## 2017-06-04 MED ORDER — ALBUMIN HUMAN 5 % IV SOLN
INTRAVENOUS | Status: DC | PRN
  Administered 2017-06-04 (×2): via INTRAVENOUS

## 2017-06-04 MED ORDER — LIDOCAINE 2% (20 MG/ML) 5 ML SYRINGE
INTRAMUSCULAR | Status: AC
  Filled 2017-06-04: qty 5

## 2017-06-04 MED ORDER — MILRINONE LACTATE IN DEXTROSE 20-5 MG/100ML-% IV SOLN
0.1250 ug/kg/min | INTRAVENOUS | Status: DC
  Filled 2017-06-04: qty 100

## 2017-06-04 MED ORDER — INSULIN REGULAR BOLUS VIA INFUSION
0.0000 [IU] | Freq: Three times a day (TID) | INTRAVENOUS | Status: DC
  Filled 2017-06-04: qty 10

## 2017-06-04 MED ORDER — PROPOFOL 10 MG/ML IV BOLUS
INTRAVENOUS | Status: DC | PRN
  Administered 2017-06-04 (×2): 100 mg via INTRAVENOUS

## 2017-06-04 MED ORDER — LACTATED RINGERS IV SOLN
INTRAVENOUS | Status: DC
  Administered 2017-06-04: 10 mL/h via INTRAVENOUS

## 2017-06-04 MED ORDER — MILRINONE LACTATE IN DEXTROSE 20-5 MG/100ML-% IV SOLN
INTRAVENOUS | Status: DC | PRN
  Administered 2017-06-04: 0.25 ug/kg/min via INTRAVENOUS

## 2017-06-04 MED ORDER — LACTATED RINGERS IV SOLN
INTRAVENOUS | Status: DC
  Administered 2017-06-04: 20 mL/h via INTRAVENOUS

## 2017-06-04 MED ORDER — HEMOSTATIC AGENTS (NO CHARGE) OPTIME
TOPICAL | Status: DC | PRN
  Administered 2017-06-04: 1 via TOPICAL

## 2017-06-04 MED ORDER — FENTANYL CITRATE (PF) 100 MCG/2ML IJ SOLN
50.0000 ug | INTRAMUSCULAR | Status: AC | PRN
  Administered 2017-06-04 – 2017-06-05 (×9): 50 ug via INTRAVENOUS
  Filled 2017-06-04 (×9): qty 2

## 2017-06-04 MED ORDER — PROTAMINE SULFATE 10 MG/ML IV SOLN
INTRAVENOUS | Status: AC
  Filled 2017-06-04: qty 25

## 2017-06-04 MED ORDER — ARTIFICIAL TEARS OPHTHALMIC OINT
TOPICAL_OINTMENT | OPHTHALMIC | Status: AC
  Filled 2017-06-04: qty 3.5

## 2017-06-04 MED ORDER — DEXTROSE 5 % IV SOLN
INTRAVENOUS | Status: DC | PRN
  Administered 2017-06-04: 750 mg via INTRAVENOUS

## 2017-06-04 MED ORDER — DEXMEDETOMIDINE HCL IN NACL 400 MCG/100ML IV SOLN: 0.0000 ug/kg/h | INTRAVENOUS | Status: DC

## 2017-06-04 MED ORDER — POTASSIUM CHLORIDE 10 MEQ/50ML IV SOLN: 10.0000 meq | INTRAVENOUS | Status: AC

## 2017-06-04 MED ORDER — ONDANSETRON HCL 4 MG/2ML IJ SOLN: 4.0000 mg | Freq: Four times a day (QID) | INTRAMUSCULAR | Status: DC | PRN

## 2017-06-04 MED ORDER — ROCURONIUM BROMIDE 10 MG/ML (PF) SYRINGE
PREFILLED_SYRINGE | INTRAVENOUS | Status: AC
  Filled 2017-06-04: qty 5

## 2017-06-04 MED ORDER — ACETAMINOPHEN 650 MG RE SUPP
650.0000 mg | Freq: Once | RECTAL | Status: AC
  Administered 2017-06-04: 650 mg via RECTAL

## 2017-06-04 MED ORDER — DEXTROSE 5 % IV SOLN
INTRAVENOUS | Status: AC
  Filled 2017-06-04: qty 1.5

## 2017-06-04 MED ORDER — BISACODYL 10 MG RE SUPP: 10.0000 mg | Freq: Every day | RECTAL | Status: DC

## 2017-06-04 MED ORDER — SUCRALFATE 1 GM/10ML PO SUSP
1.0000 g | Freq: Three times a day (TID) | ORAL | Status: DC
  Administered 2017-06-05 – 2017-06-09 (×17): 1 g via ORAL
  Filled 2017-06-04 (×18): qty 10

## 2017-06-04 MED ORDER — PROTAMINE SULFATE 10 MG/ML IV SOLN
INTRAVENOUS | Status: DC | PRN
  Administered 2017-06-04: 1150 mg via INTRAVENOUS
  Administered 2017-06-04: 50 mg via INTRAVENOUS

## 2017-06-04 MED ORDER — DEXTROSE 5 % IV SOLN: 1.5000 g | Freq: Two times a day (BID) | INTRAVENOUS | Status: DC

## 2017-06-04 MED ORDER — EPHEDRINE SULFATE 50 MG/ML IJ SOLN
INTRAMUSCULAR | Status: AC
  Filled 2017-06-04: qty 1

## 2017-06-04 MED ORDER — FAMOTIDINE IN NACL 20-0.9 MG/50ML-% IV SOLN
20.0000 mg | Freq: Two times a day (BID) | INTRAVENOUS | Status: DC
  Administered 2017-06-04: 20 mg via INTRAVENOUS

## 2017-06-04 MED ORDER — CHLORHEXIDINE GLUCONATE 0.12% ORAL RINSE (MEDLINE KIT): 15.0000 mL | Freq: Two times a day (BID) | OROMUCOSAL | Status: DC

## 2017-06-04 MED ORDER — KETOROLAC TROMETHAMINE 15 MG/ML IJ SOLN
15.0000 mg | Freq: Four times a day (QID) | INTRAMUSCULAR | Status: AC
  Administered 2017-06-04 – 2017-06-06 (×8): 15 mg via INTRAVENOUS
  Filled 2017-06-04 (×8): qty 1

## 2017-06-04 MED ORDER — ONDANSETRON HCL 4 MG/2ML IJ SOLN
INTRAMUSCULAR | Status: DC | PRN
  Administered 2017-06-04: 4 mg via INTRAVENOUS

## 2017-06-04 MED ORDER — SACCHAROMYCES BOULARDII 250 MG PO CAPS
250.0000 mg | ORAL_CAPSULE | Freq: Two times a day (BID) | ORAL | Status: DC
  Administered 2017-06-05 – 2017-06-09 (×8): 250 mg via ORAL
  Filled 2017-06-04 (×8): qty 1

## 2017-06-04 MED ORDER — HEPARIN SODIUM (PORCINE) 1000 UNIT/ML IJ SOLN
INTRAMUSCULAR | Status: DC | PRN
  Administered 2017-06-04: 24000 [IU] via INTRAVENOUS

## 2017-06-04 MED ORDER — DEXAMETHASONE SODIUM PHOSPHATE 10 MG/ML IJ SOLN
INTRAMUSCULAR | Status: AC
  Filled 2017-06-04: qty 1

## 2017-06-04 MED ORDER — SUCCINYLCHOLINE CHLORIDE 200 MG/10ML IV SOSY
PREFILLED_SYRINGE | INTRAVENOUS | Status: AC
  Filled 2017-06-04: qty 10

## 2017-06-04 MED ORDER — MIDAZOLAM HCL 5 MG/5ML IJ SOLN
INTRAMUSCULAR | Status: DC | PRN
  Administered 2017-06-04: 2 mg via INTRAVENOUS
  Administered 2017-06-04 (×2): 3 mg via INTRAVENOUS
  Administered 2017-06-04: 2 mg via INTRAVENOUS

## 2017-06-04 MED ORDER — PHENYLEPHRINE 40 MCG/ML (10ML) SYRINGE FOR IV PUSH (FOR BLOOD PRESSURE SUPPORT)
PREFILLED_SYRINGE | INTRAVENOUS | Status: AC
  Filled 2017-06-04: qty 10

## 2017-06-04 MED ORDER — MIDAZOLAM HCL 10 MG/2ML IJ SOLN
INTRAMUSCULAR | Status: AC
  Filled 2017-06-04: qty 2

## 2017-06-04 MED ORDER — MORPHINE SULFATE (PF) 4 MG/ML IV SOLN
1.0000 mg | INTRAVENOUS | Status: DC | PRN
  Administered 2017-06-04: 2 mg via INTRAVENOUS
  Filled 2017-06-04: qty 1

## 2017-06-04 MED ORDER — DOPAMINE-DEXTROSE 3.2-5 MG/ML-% IV SOLN: 0.0000 ug/kg/min | INTRAVENOUS | Status: DC

## 2017-06-04 MED ORDER — LACTATED RINGERS IV SOLN: 500.0000 mL | Freq: Once | INTRAVENOUS | Status: DC | PRN

## 2017-06-04 MED ORDER — METOPROLOL TARTRATE 12.5 MG HALF TABLET
12.5000 mg | ORAL_TABLET | Freq: Two times a day (BID) | ORAL | Status: DC
  Filled 2017-06-04 (×2): qty 1

## 2017-06-04 MED ORDER — 0.9 % SODIUM CHLORIDE (POUR BTL) OPTIME
TOPICAL | Status: DC | PRN
  Administered 2017-06-04: 5000 mL

## 2017-06-04 MED ORDER — PANTOPRAZOLE SODIUM 40 MG PO TBEC
40.0000 mg | DELAYED_RELEASE_TABLET | Freq: Every day | ORAL | Status: DC
  Administered 2017-06-06 – 2017-06-09 (×4): 40 mg via ORAL
  Filled 2017-06-04 (×4): qty 1

## 2017-06-04 MED ORDER — BISACODYL 5 MG PO TBEC
10.0000 mg | DELAYED_RELEASE_TABLET | Freq: Every day | ORAL | Status: DC
  Administered 2017-06-05 – 2017-06-06 (×2): 10 mg via ORAL
  Filled 2017-06-04 (×3): qty 2

## 2017-06-04 MED ORDER — SODIUM CHLORIDE 0.9 % IJ SOLN
INTRAMUSCULAR | Status: DC | PRN
  Administered 2017-06-04 (×3): 4 mL via TOPICAL

## 2017-06-04 MED ORDER — MIDAZOLAM HCL 2 MG/2ML IJ SOLN: 2.0000 mg | INTRAMUSCULAR | Status: DC | PRN

## 2017-06-04 MED ORDER — DEXTROSE 5 % IV SOLN
0.0000 ug/min | INTRAVENOUS | Status: DC
  Filled 2017-06-04: qty 4

## 2017-06-04 MED ORDER — SODIUM CHLORIDE 0.9 % IV SOLN
INTRAVENOUS | Status: DC | PRN
  Administered 2017-06-04: 11:00:00 via INTRAVENOUS

## 2017-06-04 MED ORDER — LACTATED RINGERS IV SOLN
INTRAVENOUS | Status: DC | PRN
  Administered 2017-06-04: 07:00:00 via INTRAVENOUS

## 2017-06-04 MED ORDER — MILRINONE LACTATE IN DEXTROSE 20-5 MG/100ML-% IV SOLN
0.1250 ug/kg/min | INTRAVENOUS | Status: DC
  Administered 2017-06-04: 0.25 ug/kg/min via INTRAVENOUS
  Administered 2017-06-05: 0.125 ug/kg/min via INTRAVENOUS
  Filled 2017-06-04 (×2): qty 100

## 2017-06-04 MED ORDER — SODIUM CHLORIDE 0.9 % IV SOLN
INTRAVENOUS | Status: DC
  Filled 2017-06-04: qty 1

## 2017-06-04 MED ORDER — OXYCODONE HCL 5 MG PO TABS
5.0000 mg | ORAL_TABLET | ORAL | Status: DC | PRN
  Administered 2017-06-04 – 2017-06-05 (×4): 10 mg via ORAL
  Filled 2017-06-04 (×4): qty 2

## 2017-06-04 MED ORDER — ORAL CARE MOUTH RINSE: 15.0000 mL | Freq: Four times a day (QID) | OROMUCOSAL | Status: DC

## 2017-06-04 MED ORDER — METOPROLOL TARTRATE 25 MG/10 ML ORAL SUSPENSION: 12.5000 mg | Freq: Two times a day (BID) | ORAL | Status: DC

## 2017-06-04 MED ORDER — PHENYLEPHRINE HCL 10 MG/ML IJ SOLN
INTRAMUSCULAR | Status: DC | PRN
  Administered 2017-06-04 (×2): 80 ug via INTRAVENOUS

## 2017-06-04 MED ORDER — ORAL CARE MOUTH RINSE
15.0000 mL | Freq: Two times a day (BID) | OROMUCOSAL | Status: DC
Start: 2017-06-04 — End: 2017-06-06
  Administered 2017-06-05 (×2): 15 mL via OROMUCOSAL

## 2017-06-04 MED ORDER — SODIUM CHLORIDE 0.9 % IV SOLN: INTRAVENOUS | Status: DC

## 2017-06-04 MED ORDER — DEXTROSE 5 % IV SOLN
2.0000 g | INTRAVENOUS | Status: DC
  Administered 2017-06-04 – 2017-06-09 (×6): 2 g via INTRAVENOUS
  Filled 2017-06-04 (×6): qty 2

## 2017-06-04 MED ORDER — DOPAMINE-DEXTROSE 3.2-5 MG/ML-% IV SOLN
INTRAVENOUS | Status: DC | PRN
  Administered 2017-06-04: 2.5 ug/kg/min via INTRAVENOUS

## 2017-06-04 MED ORDER — FENTANYL CITRATE (PF) 250 MCG/5ML IJ SOLN
INTRAMUSCULAR | Status: DC | PRN
  Administered 2017-06-04: 100 ug via INTRAVENOUS
  Administered 2017-06-04: 500 ug via INTRAVENOUS
  Administered 2017-06-04: 650 ug via INTRAVENOUS
  Administered 2017-06-04: 150 ug via INTRAVENOUS
  Administered 2017-06-04: 100 ug via INTRAVENOUS

## 2017-06-04 MED ORDER — TRAZODONE HCL 100 MG PO TABS
100.0000 mg | ORAL_TABLET | Freq: Every evening | ORAL | Status: DC | PRN
  Administered 2017-06-05 – 2017-06-08 (×4): 100 mg via ORAL
  Filled 2017-06-04 (×2): qty 1
  Filled 2017-06-04: qty 2
  Filled 2017-06-04: qty 1

## 2017-06-04 MED ORDER — ROCURONIUM BROMIDE 100 MG/10ML IV SOLN
INTRAVENOUS | Status: DC | PRN
  Administered 2017-06-04 (×2): 40 mg via INTRAVENOUS
  Administered 2017-06-04: 60 mg via INTRAVENOUS

## 2017-06-04 MED ORDER — ASPIRIN EC 325 MG PO TBEC
325.0000 mg | DELAYED_RELEASE_TABLET | Freq: Every day | ORAL | Status: DC
  Administered 2017-06-05 – 2017-06-07 (×3): 325 mg via ORAL
  Filled 2017-06-04 (×3): qty 1

## 2017-06-04 MED ORDER — ARTIFICIAL TEARS OPHTHALMIC OINT
TOPICAL_OINTMENT | OPHTHALMIC | Status: DC | PRN
  Administered 2017-06-04: 1 via OPHTHALMIC

## 2017-06-04 MED ORDER — FENTANYL CITRATE (PF) 100 MCG/2ML IJ SOLN
25.0000 ug | INTRAMUSCULAR | Status: DC | PRN
  Administered 2017-06-04: 25 ug via INTRAVENOUS
  Filled 2017-06-04: qty 2

## 2017-06-04 MED ORDER — GENTAMICIN SULFATE 40 MG/ML IJ SOLN
200.0000 mg | INTRAVENOUS | Status: DC
  Administered 2017-06-04 – 2017-06-09 (×5): 200 mg via INTRAVENOUS
  Filled 2017-06-04 (×5): qty 5

## 2017-06-04 MED ORDER — VANCOMYCIN HCL IN DEXTROSE 1-5 GM/200ML-% IV SOLN
1000.0000 mg | Freq: Once | INTRAVENOUS | Status: AC
  Administered 2017-06-04: 1000 mg via INTRAVENOUS
  Filled 2017-06-04: qty 200

## 2017-06-04 MED ORDER — CHLORHEXIDINE GLUCONATE 0.12 % MT SOLN
15.0000 mL | OROMUCOSAL | Status: AC
  Administered 2017-06-04: 15 mL via OROMUCOSAL

## 2017-06-04 MED ORDER — PHENYLEPHRINE HCL 10 MG/ML IJ SOLN
INTRAVENOUS | Status: DC | PRN
  Administered 2017-06-04: 40 ug/min via INTRAVENOUS

## 2017-06-04 MED ORDER — METOCLOPRAMIDE HCL 5 MG/ML IJ SOLN
10.0000 mg | Freq: Four times a day (QID) | INTRAMUSCULAR | Status: AC
  Administered 2017-06-04 – 2017-06-07 (×12): 10 mg via INTRAVENOUS
  Filled 2017-06-04 (×11): qty 2

## 2017-06-04 MED ORDER — TRAMADOL HCL 50 MG PO TABS
50.0000 mg | ORAL_TABLET | ORAL | Status: DC | PRN
  Administered 2017-06-05 – 2017-06-06 (×4): 100 mg via ORAL
  Administered 2017-06-07: 50 mg via ORAL
  Administered 2017-06-08 – 2017-06-09 (×2): 100 mg via ORAL
  Filled 2017-06-04 (×4): qty 2
  Filled 2017-06-04: qty 1
  Filled 2017-06-04 (×4): qty 2

## 2017-06-04 SURGICAL SUPPLY — 92 items
ADAPTER CARDIO PERF ANTE/RETRO (ADAPTER) ×4 IMPLANT
APPLICATOR COTTON TIP 6IN STRL (MISCELLANEOUS) ×4 IMPLANT
BAG DECANTER FOR FLEXI CONT (MISCELLANEOUS) ×4 IMPLANT
BINDER BREAST LRG (GAUZE/BANDAGES/DRESSINGS) ×4 IMPLANT
BLADE STERNUM SYSTEM 6 (BLADE) ×4 IMPLANT
BLADE SURG 12 STRL SS (BLADE) ×4 IMPLANT
BLADE SURG 15 STRL LF DISP TIS (BLADE) ×2 IMPLANT
BLADE SURG 15 STRL SS (BLADE) ×2
BOOT SUTURE AID YELLOW STND (SUTURE) ×4 IMPLANT
CANISTER SUCT 3000ML PPV (MISCELLANEOUS) ×4 IMPLANT
CANNULA GUNDRY RCSP 15FR (MISCELLANEOUS) ×4 IMPLANT
CATH CPB KIT VANTRIGT (MISCELLANEOUS) ×4 IMPLANT
CATH HEART VENT LEFT (CATHETERS) ×2 IMPLANT
CATH RETROPLEGIA CORONARY 14FR (CATHETERS) IMPLANT
CATH ROBINSON RED A/P 18FR (CATHETERS) ×12 IMPLANT
CATH THORACIC 36FR RT ANG (CATHETERS) ×4 IMPLANT
CLIP FOGARTY SPRING 6M (CLIP) IMPLANT
CONT SPEC 4OZ CLIKSEAL STRL BL (MISCELLANEOUS) ×8 IMPLANT
COVER SURGICAL LIGHT HANDLE (MISCELLANEOUS) ×4 IMPLANT
CRADLE DONUT ADULT HEAD (MISCELLANEOUS) ×4 IMPLANT
DRAIN CHANNEL 32F RND 10.7 FF (WOUND CARE) ×4 IMPLANT
DRAPE CARDIOVASCULAR INCISE (DRAPES) ×2
DRAPE SLUSH/WARMER DISC (DRAPES) ×4 IMPLANT
DRAPE SRG 135X102X78XABS (DRAPES) ×2 IMPLANT
DRSG AQUACEL AG ADV 3.5X14 (GAUZE/BANDAGES/DRESSINGS) ×4 IMPLANT
ELECT BLADE 4.0 EZ CLEAN MEGAD (MISCELLANEOUS) ×4
ELECT BLADE 6.5 EXT (BLADE) ×4 IMPLANT
ELECT CAUTERY BLADE 6.4 (BLADE) ×4 IMPLANT
ELECT REM PT RETURN 9FT ADLT (ELECTROSURGICAL) ×8
ELECTRODE BLDE 4.0 EZ CLN MEGD (MISCELLANEOUS) ×2 IMPLANT
ELECTRODE REM PT RTRN 9FT ADLT (ELECTROSURGICAL) ×4 IMPLANT
FELT TEFLON 1X6 (MISCELLANEOUS) ×8 IMPLANT
GAUZE SPONGE 4X4 12PLY STRL (GAUZE/BANDAGES/DRESSINGS) ×4 IMPLANT
GAUZE SPONGE 4X4 12PLY STRL LF (GAUZE/BANDAGES/DRESSINGS) ×4 IMPLANT
GLOVE BIO SURGEON STRL SZ7.5 (GLOVE) ×12 IMPLANT
GLOVE BIOGEL PI IND STRL 6 (GLOVE) ×2 IMPLANT
GLOVE BIOGEL PI IND STRL 6.5 (GLOVE) ×2 IMPLANT
GLOVE BIOGEL PI IND STRL 8.5 (GLOVE) ×6 IMPLANT
GLOVE BIOGEL PI INDICATOR 6 (GLOVE) ×2
GLOVE BIOGEL PI INDICATOR 6.5 (GLOVE) ×2
GLOVE BIOGEL PI INDICATOR 8.5 (GLOVE) ×6
GLOVE INDICATOR 7.0 STRL GRN (GLOVE) ×8 IMPLANT
GLOVE SURG ORTHO 8.5 STRL (GLOVE) ×12 IMPLANT
GOWN STRL REUS W/ TWL LRG LVL3 (GOWN DISPOSABLE) ×18 IMPLANT
GOWN STRL REUS W/TWL LRG LVL3 (GOWN DISPOSABLE) ×18
HEMOSTAT POWDER SURGIFOAM 1G (HEMOSTASIS) ×12 IMPLANT
HEMOSTAT SURGICEL 2X14 (HEMOSTASIS) ×4 IMPLANT
INSERT FOGARTY XLG (MISCELLANEOUS) IMPLANT
KIT BASIN OR (CUSTOM PROCEDURE TRAY) ×4 IMPLANT
KIT ROOM TURNOVER OR (KITS) ×4 IMPLANT
KIT SUCTION CATH 14FR (SUCTIONS) ×4 IMPLANT
LEAD PACING MYOCARDI (MISCELLANEOUS) ×4 IMPLANT
LINE VENT (MISCELLANEOUS) ×4 IMPLANT
NDL SUT 1 .5 CRC FRENCH EYE (NEEDLE) ×2 IMPLANT
NEEDLE FRENCH EYE (NEEDLE) ×2
NS IRRIG 1000ML POUR BTL (IV SOLUTION) ×24 IMPLANT
PACK OPEN HEART (CUSTOM PROCEDURE TRAY) ×4 IMPLANT
PAD ARMBOARD 7.5X6 YLW CONV (MISCELLANEOUS) ×8 IMPLANT
SET CARDIOPLEGIA MPS 5001102 (MISCELLANEOUS) ×4 IMPLANT
SURGIFLO W/THROMBIN 8M KIT (HEMOSTASIS) IMPLANT
SUT BONE WAX W31G (SUTURE) ×4 IMPLANT
SUT ETHIBON 2 0 V 52N 30 (SUTURE) ×16 IMPLANT
SUT ETHIBON EXCEL 2-0 V-5 (SUTURE) ×4 IMPLANT
SUT ETHIBOND 2 0 SH (SUTURE) ×2
SUT ETHIBOND 2 0 SH 36X2 (SUTURE) ×2 IMPLANT
SUT ETHIBOND V-5 VALVE (SUTURE) ×4 IMPLANT
SUT PROLENE 3 0 RB 1 (SUTURE) ×4 IMPLANT
SUT PROLENE 3 0 SH 1 (SUTURE) IMPLANT
SUT PROLENE 3 0 SH DA (SUTURE) ×12 IMPLANT
SUT PROLENE 4 0 RB 1 (SUTURE) ×10
SUT PROLENE 4 0 SH DA (SUTURE) ×8 IMPLANT
SUT PROLENE 4-0 RB1 .5 CRCL 36 (SUTURE) ×10 IMPLANT
SUT STEEL 6MS V (SUTURE) ×8 IMPLANT
SUT STEEL SZ 6 DBL 3X14 BALL (SUTURE) ×4 IMPLANT
SUT VIC AB 1 CTX 36 (SUTURE) ×4
SUT VIC AB 1 CTX36XBRD ANBCTR (SUTURE) ×4 IMPLANT
SUT VIC AB 2-0 CTX 27 (SUTURE) IMPLANT
SUT VIC AB 3-0 X1 27 (SUTURE) IMPLANT
SWAB CULTURE ESWAB REG 1ML (MISCELLANEOUS) ×4 IMPLANT
SYR 10ML KIT SKIN ADHESIVE (MISCELLANEOUS) IMPLANT
SYSTEM SAHARA CHEST DRAIN ATS (WOUND CARE) ×4 IMPLANT
TAPE PAPER 2X10 WHT MICROPORE (GAUZE/BANDAGES/DRESSINGS) ×4 IMPLANT
TAPE PAPER 3X10 WHT MICROPORE (GAUZE/BANDAGES/DRESSINGS) ×4 IMPLANT
TIP SONASTAR MICRO MISONIX 1.6 (TRAY / TRAY PROCEDURE) ×4 IMPLANT
TOWEL GREEN STERILE (TOWEL DISPOSABLE) IMPLANT
TOWEL GREEN STERILE FF (TOWEL DISPOSABLE) ×4 IMPLANT
TRAY FOLEY SILVER 16FR TEMP (SET/KITS/TRAYS/PACK) ×4 IMPLANT
UNDERPAD 30X30 (UNDERPADS AND DIAPERS) ×4 IMPLANT
VALVE AORTIC TOP HAT (Prosthesis & Implant Heart) ×2 IMPLANT
VALVE AORTIC TOP HAT 21 (Prosthesis & Implant Heart) ×2 IMPLANT
VENT LEFT HEART 12002 (CATHETERS) ×4
WATER STERILE IRR 1000ML POUR (IV SOLUTION) ×8 IMPLANT

## 2017-06-04 NOTE — Anesthesia Procedure Notes (Signed)
Central Venous Catheter Insertion Performed by: Duane Boston, MD, anesthesiologist Start/End12/24/2018 6:43 AM, 06/04/2017 6:53 AM Patient location: Pre-op. Preanesthetic checklist: patient identified, IV checked, site marked, risks and benefits discussed, surgical consent, monitors and equipment checked, pre-op evaluation, timeout performed and anesthesia consent Position: Trendelenburg Lidocaine 1% used for infiltration and patient sedated Hand hygiene performed , maximum sterile barriers used  and Seldinger technique used Catheter size: 8.5 Fr Total catheter length 8. PA cath was placed.Sheath introducer Swan type:thermodilution PA Cath depth:50 Procedure performed using ultrasound guided technique. Ultrasound Notes:anatomy identified, needle tip was noted to be adjacent to the nerve/plexus identified, no ultrasound evidence of intravascular and/or intraneural injection and image(s) printed for medical record Attempts: 1 Following insertion, line sutured, dressing applied and Biopatch. Post procedure assessment: free fluid flow, blood return through all ports and no air  Patient tolerated the procedure well with no immediate complications.

## 2017-06-04 NOTE — Transfer of Care (Signed)
Immediate Anesthesia Transfer of Care Note  Patient: Krystal Shaffer  Procedure(s) Performed: AORTIC VALVE REPLACEMENT (AVR) (N/A Chest) TRANSESOPHAGEAL ECHOCARDIOGRAM (TEE) (N/A )  Patient Location: SICU  Anesthesia Type:General  Level of Consciousness: unresponsive and Patient remains intubated per anesthesia plan  Airway & Oxygen Therapy: Patient remains intubated per anesthesia plan and Patient placed on Ventilator (see vital sign flow sheet for setting)  Post-op Assessment: Report given to RN and Post -op Vital signs reviewed and stable  Post vital signs: Reviewed and stable  Last Vitals:  Vitals:   06/04/17 0118 06/04/17 0535  BP: (!) 109/46 (!) 156/68  Pulse: 87 100  Resp:  18  Temp:  36.7 C  SpO2: 100% 100%    Last Pain:  Vitals:   06/04/17 0535  TempSrc: Oral  PainSc:       Patients Stated Pain Goal: 0 (10/20/00 1117)  Complications: No apparent anesthesia complications

## 2017-06-04 NOTE — Progress Notes (Signed)
[facility] PROGRESS NOTE  Patient: Krystal Shaffer [id number]:[id number]   PCP: Sofie Rower, PA-C DOB: 04/29/1976   DOA: 05/24/2017   DOS: 06/04/2017    Pt in surgery all day, post operatively Care transferred to cardiothoracic surgery. We sign off.  Please call with question.  Appreciate assistance in managing the pt.   Author: Berle Mull, MD [facility] Pager: [phone] 06/04/2017 1:59 PM   If 7PM-7AM, please contact night-coverage at [email or web address], password [facility]

## 2017-06-04 NOTE — Progress Notes (Signed)
CT surgery p.m. Rounds  Patient extubated with stable hemodynamics after aortic valve replacement for severe AI, endocarditis Neuro intact Excellent urine output Sinus rhythm

## 2017-06-04 NOTE — Anesthesia Procedure Notes (Signed)
Procedure Name: Intubation Date/Time: 06/04/2017 7:43 AM Performed by: Orlie Dakin, CRNA Pre-anesthesia Checklist: Patient identified, Emergency Drugs available, Suction available, Patient being monitored and Timeout performed Patient Re-evaluated:Patient Re-evaluated prior to induction Oxygen Delivery Method: Circle system utilized Preoxygenation: Pre-oxygenation with 100% oxygen Induction Type: IV induction Ventilation: Mask ventilation without difficulty Laryngoscope Size: Miller and 3 Grade View: Grade I Tube type: Oral Tube size: 8.0 mm Number of attempts: 1 Airway Equipment and Method: Stylet Placement Confirmation: ETT inserted through vocal cords under direct vision,  positive ETCO2 and breath sounds checked- equal and bilateral Secured at: 23 cm Tube secured with: Tape Dental Injury: Teeth and Oropharynx as per pre-operative assessment

## 2017-06-04 NOTE — Brief Op Note (Signed)
05/24/2017 - 06/04/2017  10:31 AM  PATIENT:  Krystal Shaffer  41 y.o. female  PRE-OPERATIVE DIAGNOSIS:  endocarditis, AI  POST-OPERATIVE DIAGNOSIS:  endocarditis, AI  PROCEDURE:  Procedure(s): AORTIC VALVE REPLACEMENT (AVR) (N/A) TRANSESOPHAGEAL ECHOCARDIOGRAM (TEE) (N/A)  SURGEON:  Surgeon(s) and Role:    Ivin Poot, MD - Primary  PHYSICIAN ASSISTANT:  Nicholes Rough, PA-C   ANESTHESIA:   general  EBL:  600 mL   BLOOD ADMINISTERED:none  DRAINS: routine   LOCAL MEDICATIONS USED:  NONE  SPECIMEN:  Source of Specimen:  aortic valve leaflets  DISPOSITION OF SPECIMEN:  PATHOLOGY  COUNTS:  YES  TOURNIQUET:  * No tourniquets in log *  DICTATION: .Dragon Dictation  PLAN OF CARE: Admit to inpatient   PATIENT DISPOSITION:  ICU - intubated and hemodynamically stable.   Delay start of Pharmacological VTE agent (>24hrs) due to surgical blood loss or risk of bleeding: yes

## 2017-06-04 NOTE — Anesthesia Procedure Notes (Signed)
Arterial Line Insertion Start/End12/24/2018 6:51 AM, 06/04/2017 6:57 AM Performed by: Duane Boston, MD, anesthesiologist  Patient location: Pre-op. Preanesthetic checklist: patient identified, IV checked, site marked, risks and benefits discussed, surgical consent, monitors and equipment checked, pre-op evaluation and timeout performed Lidocaine 1% used for infiltration and patient sedated Left, radial was placed Catheter size: 20 G Hand hygiene performed  and maximum sterile barriers used   Attempts: 2 Procedure performed without using ultrasound guided technique. Following insertion, dressing applied and Biopatch. Post procedure assessment: normal  Patient tolerated the procedure well with no immediate complications.

## 2017-06-04 NOTE — Anesthesia Postprocedure Evaluation (Signed)
Anesthesia Post Note  Patient: Aquanetta Spittler  Procedure(s) Performed: AORTIC VALVE REPLACEMENT (AVR) (N/A Chest) TRANSESOPHAGEAL ECHOCARDIOGRAM (TEE) (N/A )     Patient location during evaluation: SICU Anesthesia Type: General Level of consciousness: sedated Pain management: pain level controlled Vital Signs Assessment: post-procedure vital signs reviewed and stable Respiratory status: patient remains intubated per anesthesia plan Cardiovascular status: stable Postop Assessment: no apparent nausea or vomiting Anesthetic complications: no    Last Vitals:  Vitals:   06/04/17 1300 06/04/17 1315  BP: 92/68 93/70  Pulse: 76 77  Resp: 19 19  Temp: (!) 36.4 C 36.5 C  SpO2: 100% 100%    Last Pain:  Vitals:   06/04/17 1215  TempSrc: Core  PainSc:                  Poetry Cerro DANIEL

## 2017-06-04 NOTE — Progress Notes (Addendum)
  Echocardiogram Echocardiogram Transesophageal has been performed.  Joleen Stuckert L Androw 06/04/2017, 8:23 AM

## 2017-06-04 NOTE — Progress Notes (Signed)
Pre Procedure note for inpatients:   Krystal Shaffer has been scheduled for Procedure(s): AORTIC VALVE REPLACEMENT (AVR) (N/A) TRANSESOPHAGEAL ECHOCARDIOGRAM (TEE) (N/A) today. The various methods of treatment have been discussed with the patient. After consideration of the risks, benefits and treatment options the patient has consented to the planned procedure.   The patient has been seen and labs reviewed. There are no changes in the patient's condition to prevent proceeding with the planned procedure today.  Recent labs:  Lab Results  Component Value Date   WBC 7.1 06/04/2017   HGB 8.2 (L) 06/04/2017   HCT 26.2 (L) 06/04/2017   PLT 242 06/04/2017   GLUCOSE 92 06/04/2017   ALT 10 (L) 05/24/2017   AST 15 05/24/2017   NA 136 06/04/2017   K 3.6 06/04/2017   CL 102 06/04/2017   CREATININE 0.67 06/04/2017   BUN 16 06/04/2017   CO2 28 06/04/2017   TSH 3.337 05/25/2017   INR 1.18 06/02/2017    Ivin Poot III, MD 06/04/2017 7:10 AM

## 2017-06-04 NOTE — Progress Notes (Signed)
Pt taken at this time via stretcher to OR for prep; family at bedside, following pt downstairs to waiting area.

## 2017-06-04 NOTE — Procedures (Signed)
Extubation Procedure Note  Patient Details:   Name: Krystal Shaffer DOB: 02/02/1976 MRN: [id number]   Airway Documentation:     Evaluation  O2 sats: stable throughout Complications: No apparent complications Patient did tolerate procedure well. Bilateral Breath Sounds: Diminished, Clear   Yes   Extubated to 1559 per SICU wean protocol. NIF -20, VC .85 L. Able to vocalize and clear secretions. 4LNC currently  Saunders Glance 06/04/2017, 4:10 PM

## 2017-06-05 ENCOUNTER — Inpatient Hospital Stay (HOSPITAL_COMMUNITY): Payer: 59

## 2017-06-05 LAB — CBC
HCT: 28.2 % — ABNORMAL LOW (ref 36.0–46.0)
HCT: 29.3 % — ABNORMAL LOW (ref 36.0–46.0)
Hemoglobin: 9.1 g/dL — ABNORMAL LOW (ref 12.0–15.0)
Hemoglobin: 9.3 g/dL — ABNORMAL LOW (ref 12.0–15.0)
MCH: 27.1 pg (ref 26.0–34.0)
MCH: 26.7 pg (ref 26.0–34.0)
MCHC: 32.3 g/dL (ref 30.0–36.0)
MCHC: 31.7 g/dL (ref 30.0–36.0)
MCV: 83.9 fL (ref 78.0–100.0)
MCV: 84.2 fL (ref 78.0–100.0)
PLATELETS: 159 10*3/uL (ref 150–400)
PLATELETS: 165 10*3/uL (ref 150–400)
RBC: 3.36 MIL/uL — ABNORMAL LOW (ref 3.87–5.11)
RBC: 3.48 MIL/uL — AB (ref 3.87–5.11)
RDW: 14.8 % (ref 11.5–15.5)
RDW: 14.8 % (ref 11.5–15.5)
WBC: 12 10*3/uL — ABNORMAL HIGH (ref 4.0–10.5)
WBC: 9.7 10*3/uL (ref 4.0–10.5)

## 2017-06-05 LAB — COOXEMETRY PANEL
Carboxyhemoglobin: 1.9 % — ABNORMAL HIGH (ref 0.5–1.5)
Methemoglobin: 1 % (ref 0.0–1.5)
O2 Saturation: 79.5 %
Total hemoglobin: 9.3 g/dL — ABNORMAL LOW (ref 12.0–16.0)

## 2017-06-05 LAB — GLUCOSE, CAPILLARY
GLUCOSE-CAPILLARY: 113 mg/dL — AB (ref 65–99)
Glucose-Capillary: 101 mg/dL — ABNORMAL HIGH (ref 65–99)
Glucose-Capillary: 100 mg/dL — ABNORMAL HIGH (ref 65–99)

## 2017-06-05 LAB — POCT I-STAT, CHEM 8
BUN: 9 mg/dL (ref 6–20)
CHLORIDE: 94 mmol/L — AB (ref 101–111)
CREATININE: 0.6 mg/dL (ref 0.44–1.00)
Calcium, Ion: 1.16 mmol/L (ref 1.15–1.40)
GLUCOSE: 91 mg/dL (ref 65–99)
HEMATOCRIT: 33 % — AB (ref 36.0–46.0)
Hemoglobin: 11.2 g/dL — ABNORMAL LOW (ref 12.0–15.0)
POTASSIUM: 4.2 mmol/L (ref 3.5–5.1)
Sodium: 134 mmol/L — ABNORMAL LOW (ref 135–145)
TCO2: 26 mmol/L (ref 22–32)

## 2017-06-05 LAB — CREATININE, SERUM
CREATININE: 0.7 mg/dL (ref 0.44–1.00)
GFR calc non Af Amer: >60 mL/min (ref >60)

## 2017-06-05 LAB — BASIC METABOLIC PANEL
Anion gap: 7 (ref 5–15)
BUN: 7 mg/dL (ref 6–20)
CO2: 25 mmol/L (ref 22–32)
CREATININE: 0.54 mg/dL (ref 0.44–1.00)
Calcium: 8 mg/dL — ABNORMAL LOW (ref 8.9–10.3)
Chloride: 100 mmol/L — ABNORMAL LOW (ref 101–111)
GFR calc Af Amer: >60 mL/min (ref >60)
GLUCOSE: 109 mg/dL — AB (ref 65–99)
Potassium: 4.6 mmol/L (ref 3.5–5.1)
SODIUM: 132 mmol/L — AB (ref 135–145)

## 2017-06-05 LAB — MAGNESIUM
MAGNESIUM: 1.9 mg/dL (ref 1.7–2.4)
MAGNESIUM: 1.8 mg/dL (ref 1.7–2.4)

## 2017-06-05 MED ORDER — FUROSEMIDE 10 MG/ML IJ SOLN
20.0000 mg | Freq: Two times a day (BID) | INTRAMUSCULAR | Status: DC
  Administered 2017-06-05 (×2): 20 mg via INTRAVENOUS
  Filled 2017-06-05 (×2): qty 2

## 2017-06-05 MED ORDER — SODIUM CHLORIDE 0.9% FLUSH: 10.0000 mL | INTRAVENOUS | Status: DC | PRN

## 2017-06-05 MED ORDER — FENTANYL 75 MCG/HR TD PT72: 75.0000 ug | MEDICATED_PATCH | TRANSDERMAL | Status: DC

## 2017-06-05 MED ORDER — FENTANYL 25 MCG/HR TD PT72
75.0000 ug | MEDICATED_PATCH | TRANSDERMAL | Status: DC
  Administered 2017-06-05 – 2017-06-08 (×2): 75 ug via TRANSDERMAL
  Filled 2017-06-05: qty 3
  Filled 2017-06-05: qty 1

## 2017-06-05 MED ORDER — WARFARIN SODIUM 1 MG PO TABS
1.0000 mg | ORAL_TABLET | Freq: Every day | ORAL | Status: DC
  Administered 2017-06-05: 1 mg via ORAL
  Filled 2017-06-05: qty 1

## 2017-06-05 MED ORDER — CHLORHEXIDINE GLUCONATE CLOTH 2 % EX PADS
6.0000 | MEDICATED_PAD | Freq: Every day | CUTANEOUS | Status: DC
  Administered 2017-06-05 – 2017-06-06 (×2): 6 via TOPICAL

## 2017-06-05 MED ORDER — OXYCODONE HCL 5 MG PO TABS
5.0000 mg | ORAL_TABLET | ORAL | Status: DC | PRN
  Administered 2017-06-05 – 2017-06-06 (×3): 5 mg via ORAL
  Filled 2017-06-05 (×3): qty 1

## 2017-06-05 MED ORDER — SODIUM CHLORIDE 0.9% FLUSH
10.0000 mL | Freq: Two times a day (BID) | INTRAVENOUS | Status: DC
  Administered 2017-06-05 – 2017-06-06 (×4): 10 mL

## 2017-06-05 MED ORDER — METOPROLOL TARTRATE 25 MG PO TABS
25.0000 mg | ORAL_TABLET | Freq: Two times a day (BID) | ORAL | Status: DC
  Administered 2017-06-05 – 2017-06-09 (×9): 25 mg via ORAL
  Filled 2017-06-05 (×9): qty 1

## 2017-06-05 MED ORDER — WARFARIN - PHYSICIAN DOSING INPATIENT: Freq: Every day | Status: DC

## 2017-06-05 NOTE — Progress Notes (Signed)
1 Day Post-Op Procedure(s) (LRB): AORTIC VALVE REPLACEMENT (AVR) (N/A) TRANSESOPHAGEAL ECHOCARDIOGRAM (TEE) (N/A) Subjective: Chest wall pain- tenderness cxr clear ecg shows epicarditis Objective: Vital signs in last 24 hours: Temp:  [97 F (36.1 C)-98.6 F (37 C)] 97.5 F (36.4 C) (12/25 0715) Pulse Rate:  [74-110] 101 (12/25 0715) Cardiac Rhythm: Normal sinus rhythm (12/25 0400) Resp:  [9-31] 20 (12/25 0715) BP: (85-140)/(41-94) 123/82 (12/25 0715) SpO2:  [94 %-100 %] 100 % (12/25 0715) Arterial Line BP: (89-165)/(50-112) 139/79 (12/25 0715) FiO2 (%):  [40 %-50 %] 40 % (12/24 1533) Weight:  [152 lb 12.8 oz (69.3 kg)] 152 lb 12.8 oz (69.3 kg) (12/25 0500)  Hemodynamic parameters for last 24 hours: PAP: (13-33)/(4-18) 17/6 CO:  [4 L/min-7 L/min] 5.3 L/min CI:  [2.4 L/min/m2-4.1 L/min/m2] 3.1 L/min/m2  Intake/Output from previous day: 12/24 0701 - 12/25 0700 In: 5043.4 [P.O.:480; I.V.:3113.4; Blood:350; IV Piggyback:1100] Out: 3840 [Urine:2990; Blood:600; Chest Tube:250] Intake/Output this shift: No intake/output data recorded.       Exam    General- alert and comfortable   Lungs- clear without rales, wheezes   Cor- regular rate and rhythm, no murmur , gallop   Abdomen- soft, non-tender   Extremities - warm, non-tender, minimal edema   Neuro- oriented, appropriate, no focal weakness   Lab Results: Recent Labs    06/04/17 1747 06/04/17 1759 06/05/17 0502  WBC 10.8*  --  12.0*  HGB 9.7* 10.5* 9.1*  HCT 29.9* 31.0* 28.2*  PLT 149*  --  159   BMET:  Recent Labs    06/04/17 0355  06/04/17 1759 06/05/17 0502  NA 136   < > 137 132*  K 3.6   < > 4.5 4.6  CL 102   < > 103 100*  CO2 28  --   --  25  GLUCOSE 92   < > 159* 109*  BUN 16   < > 9 7  CREATININE 0.67   < > 0.50 0.54  CALCIUM 8.1*  --   --  8.0*   < > = values in this interval not displayed.    PT/INR:  Recent Labs    06/04/17 1208  LABPROT 19.3*  INR 1.64   ABG    Component Value  Date/Time   PHART 7.325 (L) 06/04/2017 1658   HCO3 22.5 06/04/2017 1658   TCO2 23 06/04/2017 1759   ACIDBASEDEF 3.0 (H) 06/04/2017 1658   O2SAT 79.5 06/05/2017 0515   CBG (last 3)  Recent Labs    06/04/17 1933 06/04/17 2314 06/05/17 0327  GLUCAP 134* 110* 101*    Assessment/Plan: S/P Procedure(s) (LRB): AORTIC VALVE REPLACEMENT (AVR) (N/A) TRANSESOPHAGEAL ECHOCARDIOGRAM (TEE) (N/A) Mobilize Diuresis d/c tubes/lines See progression orders couamadin and daily INR for mechanical AVR   LOS: 10 days    Krystal Shaffer 06/05/2017

## 2017-06-05 NOTE — Progress Notes (Signed)
CT surgery p.m. Rounds  Patient up in chair and ambulating in hallway Complains of upper abdominal pain possibly related to chest tube-we'll remove P.m. labs satisfactory Sinus rhythm with stable blood pressure Continue current care Add fentanyl patch for 3 days and stop IV fentanyl

## 2017-06-06 ENCOUNTER — Inpatient Hospital Stay (HOSPITAL_COMMUNITY): Payer: 59

## 2017-06-06 ENCOUNTER — Encounter (HOSPITAL_COMMUNITY): Payer: Self-pay | Admitting: Cardiothoracic Surgery

## 2017-06-06 LAB — BASIC METABOLIC PANEL
Anion gap: 8 (ref 5–15)
BUN: 10 mg/dL (ref 6–20)
CO2: 25 mmol/L (ref 22–32)
Calcium: 8.3 mg/dL — ABNORMAL LOW (ref 8.9–10.3)
Chloride: 98 mmol/L — ABNORMAL LOW (ref 101–111)
Creatinine, Ser: 0.74 mg/dL (ref 0.44–1.00)
GFR calc Af Amer: >60 mL/min (ref >60)
GFR calc non Af Amer: >60 mL/min (ref >60)
Glucose, Bld: 85 mg/dL (ref 65–99)
Potassium: 4.2 mmol/L (ref 3.5–5.1)
Sodium: 131 mmol/L — ABNORMAL LOW (ref 135–145)

## 2017-06-06 LAB — CBC
HCT: 26.9 % — ABNORMAL LOW (ref 36.0–46.0)
Hemoglobin: 8.8 g/dL — ABNORMAL LOW (ref 12.0–15.0)
MCH: 27.7 pg (ref 26.0–34.0)
MCHC: 32.7 g/dL (ref 30.0–36.0)
MCV: 84.6 fL (ref 78.0–100.0)
Platelets: 168 10*3/uL (ref 150–400)
RBC: 3.18 MIL/uL — ABNORMAL LOW (ref 3.87–5.11)
RDW: 15.3 % (ref 11.5–15.5)
WBC: 9.1 10*3/uL (ref 4.0–10.5)

## 2017-06-06 LAB — PROTIME-INR
INR: 1.11
Prothrombin Time: 14.3 seconds (ref 11.4–15.2)

## 2017-06-06 LAB — COOXEMETRY PANEL
Carboxyhemoglobin: 2.2 % — ABNORMAL HIGH (ref 0.5–1.5)
Methemoglobin: 0.9 % (ref 0.0–1.5)
O2 Saturation: 68.2 %
Total hemoglobin: 8.9 g/dL — ABNORMAL LOW (ref 12.0–16.0)

## 2017-06-06 MED ORDER — WARFARIN SODIUM 4 MG PO TABS
4.0000 mg | ORAL_TABLET | Freq: Every day | ORAL | Status: DC
  Administered 2017-06-06: 4 mg via ORAL
  Filled 2017-06-06: qty 1

## 2017-06-06 MED ORDER — FUROSEMIDE 40 MG PO TABS
40.0000 mg | ORAL_TABLET | Freq: Every day | ORAL | Status: DC
  Administered 2017-06-06 – 2017-06-09 (×4): 40 mg via ORAL
  Filled 2017-06-06 (×4): qty 1

## 2017-06-06 MED ORDER — MAGNESIUM HYDROXIDE 400 MG/5ML PO SUSP: 30.0000 mL | Freq: Every day | ORAL | Status: DC | PRN

## 2017-06-06 MED ORDER — MOVING RIGHT ALONG BOOK
Freq: Once | Status: AC
  Administered 2017-06-06: 1
  Filled 2017-06-06: qty 1

## 2017-06-06 MED ORDER — SODIUM CHLORIDE 0.9 % IV SOLN: 250.0000 mL | INTRAVENOUS | Status: DC | PRN

## 2017-06-06 MED ORDER — SODIUM CHLORIDE 0.9% FLUSH
3.0000 mL | INTRAVENOUS | Status: DC | PRN
  Administered 2017-06-09: 3 mL via INTRAVENOUS
  Filled 2017-06-06: qty 3

## 2017-06-06 MED ORDER — SODIUM CHLORIDE 0.9% FLUSH
3.0000 mL | Freq: Two times a day (BID) | INTRAVENOUS | Status: DC
  Administered 2017-06-06: 3 mL via INTRAVENOUS

## 2017-06-06 MED FILL — Magnesium Sulfate Inj 50%: INTRAMUSCULAR | Qty: 10 | Status: AC

## 2017-06-06 MED FILL — Sodium Chloride IV Soln 0.9%: INTRAVENOUS | Qty: 250 | Status: AC

## 2017-06-06 MED FILL — Heparin Sodium (Porcine) Inj 1000 Unit/ML: INTRAMUSCULAR | Qty: 2500 | Status: AC

## 2017-06-06 MED FILL — Dexmedetomidine HCl in NaCl 0.9% IV Soln 400 MCG/100ML: INTRAVENOUS | Qty: 100 | Status: AC

## 2017-06-06 MED FILL — Phenylephrine HCl Inj 10 MG/ML: INTRAMUSCULAR | Qty: 2 | Status: CN

## 2017-06-06 MED FILL — Heparin Sodium (Porcine) Inj 1000 Unit/ML: INTRAMUSCULAR | Qty: 30 | Status: AC

## 2017-06-06 MED FILL — Potassium Chloride Inj 2 mEq/ML: INTRAVENOUS | Qty: 40 | Status: AC

## 2017-06-06 MED FILL — Heparin Sodium (Porcine) Inj 1000 Unit/ML: INTRAMUSCULAR | Qty: 2500 | Status: CN

## 2017-06-06 NOTE — Op Note (Signed)
NAME:  Krystal Shaffer, Krystal Shaffer                  ACCOUNT NO.:  MEDICAL RECORD NO.:  [id number]  LOCATION:                                 FACILITY:  PHYSICIAN:  Ivin Poot, M.D.  DATE OF BIRTH:  1976/05/19  DATE OF PROCEDURE:  06/04/2017 DATE OF DISCHARGE:                              OPERATIVE REPORT   OPERATION:  Aortic valve replacement with a 21 mm mechanical valve - [employer] serial number [id number].  SURGEON:  Ivin Poot, MD.  ASSISTANT:  Nicholes Rough, PA-C.  PREOPERATIVE DIAGNOSIS:  Endocarditis of the aortic valve with severe aortic insufficiency, history of streptococcal bacteremia.  POSTOPERATIVE DIAGNOSIS:  Endocarditis of the aortic valve with severe aortic insufficiency, history of streptococcal bacteremia.  ANESTHESIA:  General by Dr. Tamela Gammon.  INDICATION FOR PROCEDURE:  Following discussion with the patient of the rationale for surgery, the benefits of surgery, the alternatives to surgery, and the associated risks of surgery, an informed consent was obtained.  The patient understood that a mechanical valve would require lifelong commitment to Coumadin anticoagulation.  She understood that her diet would have modifications.  She understood the risks of the procedure to include risk of stroke, bleeding, blood transfusion requirement, heart block and pacemaker requirement, postoperative infection, postoperative organ failure, recurrent infection, and death.  DESCRIPTION OF PROCEDURE:  The patient was brought from the preop holding area to the OR and placed supine on the operating table, where general anesthesia was induced.  She remained stable.  A transesophageal echo probe was placed by the anesthesia team.  This confirmed severe aortic insufficiency with vegetations of the aortic valve.  There was no vegetation of the mitral valve.  The patient was prepped and draped as a sterile field.  A proper time- out was performed.  A sternal incision was  made.  The sternum was retracted.  The pericardium was opened and suspended.  Pursestrings were placed in ascending aorta and right atrium.  After heparin level was documented as being therapeutic, the patient was cannulated and placed on a cardiopulmonary bypass.  A left ventricular vent was placed via the right superior pulmonary vein.  The patient's heart was enlarged and LV was slightly hypocontractile.  The patient was placed on cardiopulmonary bypass and a vent was used to decompress the left ventricle.  Cardioplegic cannulas were placed both antegrade and retrograde coronary sinus cold blood cardioplegia.  The patient was cooled to 32 degrees and an aortic crossclamp was applied. A liter of cold blood cardioplegia was delivered mainly through the retrograde coronary sinus catheter as the patient's severe aortic insufficiency made antegrade aortic cardioplegia ineffective.  Topical ice saline slush was added as well.  A transverse aortotomy was performed.  The aortic valve was inspected. Had 3 leaflets.  There were soft friable vegetations on all 3 leaflets. The leaflets were excised with vegetations.  The material was sent for culture.  The outflow tract was irrigated with copious amounts of cold saline.  The annulus was sized to a 21 mm [employer] valve.  Subannular 2-0 Ethibond pledgeted sutures were placed around the anulus, numbering 15 total.  The 21 mm [employer] valve was brought to  the field and the sutures were placed through the sewing ring and the valve was seated and the sutures were tied.  There was good confirmation of the valve to the annulus and there was no obstruction of the coronary ostium.  The aortotomy was closed in layers using running 4-0 Prolene and air was vented from the coronaries with a dose of retrograde warm blood cardioplegia in usual de-airing maneuvers.  The crossclamp was removed.  The patient was rewarmed and reperfused.  The LV vent and  cardioplegic cannulas were removed.  Temporary pacing wires were applied.  The patient was rewarmed to normothermia.  The lungs were expanded and ventilator was resumed.  The patient was started on low-dose milrinone and weaned off cardiopulmonary bypass without difficulty.  Aortic valve function appeared to be normal.  LV function was improved.  Protamine was administered without adverse reaction.  The cannulas were removed. The mediastinum was irrigated.  The superior pericardial fat was closed over the aorta and the pericardium was closed.  Anterior mediastinal chest tube was placed and brought out through separate incision.  The sternum was closed with wire.  The patient remained stable.  The pectoralis fascia was closed with a running #1 Vicryl.  The subcutaneous and skin layers were closed in running Vicryl and sterile dressings were applied.  Total cardiopulmonary bypass time was 100 minutes.     Ivin Poot, M.D.     PV/MEDQ  D:  06/04/2017  T:  06/04/2017  Job:  [id number]

## 2017-06-06 NOTE — Progress Notes (Signed)
[facility] Infusion Coordinator is following pt with ID team to support Home Infusion services upon DC to home.  If patient discharges after hours, please call [phone].   Krystal Shaffer 06/06/2017, 6:53 AM

## 2017-06-06 NOTE — Plan of Care (Signed)
  Pain Managment: General experience of comfort will improve 06/05/2017 2206 - Not progressing by Damian Leavell, RN Note Continuing to give prn pain medication. Pt pain level mostly unchanged. Pt educated that fentanyl patch needs time to absorb in skin and start taking effect. Pt also educated that in order to progress home, IV pain medication has to be stopped and transitioned to oral. (IV pain meds d/c'd today). Pt and family expressed understanding.

## 2017-06-06 NOTE — Progress Notes (Signed)
1445 Cardiac Rehab Attempted to get pt up to ambulate. She states that she is feeling dizzy from the pain medication and that she would like to wait. I instructed her that she needs to walk three times daily. She states that she will walk as soon as she is feeling better. We will follw pt tomorrow.

## 2017-06-06 NOTE — Progress Notes (Signed)
2 Days Post-Op Procedure(s) (LRB): AORTIC VALVE REPLACEMENT (AVR) (N/A) TRANSESOPHAGEAL ECHOCARDIOGRAM (TEE) (N/A) Subjective: Incisional pain better Walked unit Ready for tx to stepdown  Objective: Vital signs in last 24 hours: Temp:  [97.7 F (36.5 C)-98.7 F (37.1 C)] 98.6 F (37 C) (12/26 0700) Pulse Rate:  [88-122] 102 (12/26 0700) Cardiac Rhythm: Sinus tachycardia (12/26 0500) Resp:  [14-28] 18 (12/26 0700) BP: (113-145)/(77-100) 122/82 (12/26 0700) SpO2:  [93 %-99 %] 96 % (12/26 0700) Arterial Line BP: (124-145)/(81-120) 141/89 (12/25 1000) Weight:  [151 lb 11.2 oz (68.8 kg)] 151 lb 11.2 oz (68.8 kg) (12/26 0500)  Hemodynamic parameters for last 24 hours: PAP: (18-30)/(10-21) 25/19  Intake/Output from previous day: 12/25 0701 - 12/26 0700 In: 2209.7 [P.O.:480; I.V.:1479.7; IV Piggyback:250] Out: 1340 [Urine:1300; Chest Tube:40] Intake/Output this shift: No intake/output data recorded.       Exam    General- alert and comfortable   Lungs- clear without rales, wheezes   Cor- regular rate and rhythm, no murmur , gallop   Abdomen- soft, non-tender   Extremities - warm, non-tender, minimal edema   Neuro- oriented, appropriate, no focal weakness   Lab Results: Recent Labs    06/05/17 1712 06/05/17 1942 06/06/17 0408  WBC 9.7  --  9.1  HGB 9.3* 11.2* 8.8*  HCT 29.3* 33.0* 26.9*  PLT 165  --  168   BMET:  Recent Labs    06/05/17 0502  06/05/17 1942 06/06/17 0408  NA 132*  --  134* 131*  K 4.6  --  4.2 4.2  CL 100*  --  94* 98*  CO2 25  --   --  25  GLUCOSE 109*  --  91 85  BUN 7  --  9 10  CREATININE 0.54   < > 0.60 0.74  CALCIUM 8.0*  --   --  8.3*   < > = values in this interval not displayed.    PT/INR:  Recent Labs    06/06/17 0408  LABPROT 14.3  INR 1.11   ABG    Component Value Date/Time   PHART 7.325 (L) 06/04/2017 1658   HCO3 22.5 06/04/2017 1658   TCO2 26 06/05/2017 1942   ACIDBASEDEF 3.0 (H) 06/04/2017 1658   O2SAT 68.2  06/06/2017 0429   CBG (last 3)  Recent Labs    06/05/17 0327 06/05/17 1214 06/05/17 1604  GLUCAP 101* 113* 100*    Assessment/Plan: S/P Procedure(s) (LRB): AORTIC VALVE REPLACEMENT (AVR) (N/A) TRANSESOPHAGEAL ECHOCARDIOGRAM (TEE) (N/A) Mobilize Diuresis Plan for transfer to step-down: see transfer orders coumadin for mechanical AVR  Home antibiotics for  Strep endocarditis- OR cultrues negative   LOS: 11 days    Krystal Shaffer 06/06/2017

## 2017-06-06 NOTE — Progress Notes (Signed)
Pt oriented to room and equipment. Visitors at bedside. VSS. Telemetry applied, CCMD notified. Pt assisted to restroom per her request. Will continue to monitor.   Fritz Pickerel, RN

## 2017-06-06 NOTE — Discharge Summary (Signed)
Physician Discharge Summary       [address]       [place] [address]             [phone]    Patient ID: Krystal Shaffer MRN: [id number] DOB/AGE: 40-Oct-1977 40 y.o.  Admit date: 05/24/2017 Discharge date: 06/09/2017  Admission Diagnoses: 1. Streptococcal bacteremia 2. Aortic valve endocarditis 3. Severe aortic insufficiency   Active Diagnoses:  1. Bacterial vaginosis 2. Migraine 3. UTI 4. Chronic apical periodontitis 5. Retained root segments 6.  Dental caries 7. Chronic periodontitis 8. Accretions 9. ABL anemia  Consults: cardiology, GI and ID  Procedure (s):  Edited Result - FINAL on 05/25/2017:                              [facility]*                         [address], [place] [address]                            [phone]  ------------------------------------------------------------------- 1. Transthoracic Echocardiography  (Report amended )  Patient:    Krystal, Shaffer MR #:       [id number] Study Date: 05/25/2017 Gender:     F Age:        41 Height:     165.1 cm Weight:     68 kg BSA:        1.78 m^2 Pt. Status: Room:       6N08C   PERFORMING   [facility], Inpatient  ATTENDING    Mariel Aloe  SONOGRAPHER  Jannett Celestine, RDCS  ADMITTING    Jonnie Finner, Roy, Adam Harrington Challenger  REFERRING    Schertz, Michele Mcalpine  cc:  ------------------------------------------------------------------- LV EF: 60% -   65%  ------------------------------------------------------------------- Indications:      Murmur 785.2.  ------------------------------------------------------------------- History:   PMH:  Cystitis.  ------------------------------------------------------------------- Study Conclusions  - Left ventricle: The cavity size was normal. Systolic function was   normal. The estimated ejection fraction was in the  range of 60%   to 65%. Wall motion was normal; there were no regional wall   motion abnormalities. Doppler parameters are consistent with   abnormal left ventricular relaxation (grade 1 diastolic   dysfunction). - Aortic valve: Trileaflet; moderately thickened leaflets with 1cm   x 1cm mass on the right coronary cusp, mild mobility, suggestive   of vegetation. If no signs of infection, consider fibroelastoma.   There was very mild stenosis. There was severe regurgitation.   Valve area (VTI): 1.32 cm^2. Valve area (Vmax): 1.41 cm^2. Valve   area (Vmean): 1.28 cm^2.  ------------------------------------------------------------------- Study data:  No prior study was available for comparison.  Study status:  Routine.  Procedure:  The patient reported no pain pre or post test. Transthoracic echocardiography. Image quality was adequate.  Study completion:  There were no complications. Transthoracic echocardiography.  M-mode, complete 2D, spectral Doppler, and color Doppler.  Birthdate:  Patient birthdate: [birth year].  Age:  Patient is 41 yr old.  Sex:  Gender: female. BMI: 25 kg/m^2.  Blood pressure:     120/54  Patient status: Inpatient.  Study date:  Study date: 05/25/2017. Study time: 09:48 AM.  Location:  Bedside.  -------------------------------------------------------------------  ------------------------------------------------------------------- Left ventricle:  The cavity size was normal. Systolic function was normal. The estimated ejection fraction was in the range of 60% to 65%. Wall motion was normal; there were no regional wall motion abnormalities. Doppler parameters are consistent with abnormal left ventricular relaxation (grade 1 diastolic dysfunction).  ------------------------------------------------------------------- Aortic valve:  Trileaflet; moderately thickened leaflets with 1cm x 1cm mass on the right coronary cusp, mild mobility, suggestive of vegetation. If  no signs of infection, consider fibroelastoma. Mobility was not restricted.  Doppler:   There was very mild stenosis.   There was severe regurgitation.    VTI ratio of LVOT to aortic valve: 0.42. Valve area (VTI): 1.32 cm^2. Indexed valve area (VTI): 0.74 cm^2/m^2. Peak velocity ratio of LVOT to aortic valve: 0.45. Valve area (Vmax): 1.41 cm^2. Indexed valve area (Vmax): 0.79 cm^2/m^2. Mean velocity ratio of LVOT to aortic valve: 0.41. Valve area (Vmean): 1.28 cm^2. Indexed valve area (Vmean): 0.72 cm^2/m^2.    Mean gradient (S): 15 mm Hg. Peak gradient (S): 24 mm Hg.  ------------------------------------------------------------------- Aorta:  Aortic root: The aortic root was normal in size.  ------------------------------------------------------------------- Mitral valve:   Structurally normal valve.   Mobility was not restricted.  Doppler:  Transvalvular velocity was within the normal range. There was no evidence for stenosis. There was no regurgitation.    Peak gradient (D): 8 mm Hg.  ------------------------------------------------------------------- Left atrium:  The atrium was normal in size.  ------------------------------------------------------------------- Right ventricle:  The cavity size was normal. Wall thickness was normal. Systolic function was normal.  ------------------------------------------------------------------- Pulmonic valve:   Poorly visualized.  Structurally normal valve. Cusp separation was normal.  Doppler:  Transvalvular velocity was within the normal range. There was no evidence for stenosis. There was no regurgitation.  ------------------------------------------------------------------- Tricuspid valve:   Structurally normal valve.    Doppler: Transvalvular velocity was within the normal range. There was no regurgitation.  ------------------------------------------------------------------- Pulmonary artery:   The main pulmonary artery was  normal-sized. Systolic pressure was within the normal range.  ------------------------------------------------------------------- Right atrium:  The atrium was normal in size.  ------------------------------------------------------------------- Pericardium:  There was no pericardial effusion.  ------------------------------------------------------------------- Systemic veins: Inferior vena cava: The vessel was normal in size.    ------------------------------------------------------------------- 2. Transesophageal Echocardiography done 05/29/2017:  Patient:    Krystal, Shaffer MR #:       [id number] Study Date: 05/29/2017 Gender:     F Age:        41 Height:     162.6 cm Weight:     68.2 kg BSA:        1.77 m^2 Pt. Status: Room:       [facility]   REFERRING    Mertie Moores, M.D.  PERFORMING   Candee Furbish, M.D.  [place] [id number]  SONOGRAPHER  Johny Chess, RDCS, CCT  ORDERING     Nahser, Jr  ADMITTING    Bennie Pierini  cc:  ------------------------------------------------------------------- LV EF: 60% -   65%  ------------------------------------------------------------------- Indications:      Aortic insufficiency 424.1.  ------------------------------------------------------------------- History:   PMH:   Bacteremia.  ------------------------------------------------------------------- Study Conclusions  - Left ventricle: The cavity size was normal. Wall thickness was   normal. Systolic function was normal. The estimated ejection   fraction was in the  range of 60% to 65%. - Aortic valve: Functionally bicuspid; moderately thickened   leaflets. There was a vegetation. There was a large, 1.4 cm (L) x   0.7 cm (W), fixed vegetation on the noncoronary cusp. Another   vegetation is present at the tip of the fusion point of right and   left coronary cusp, 0.7 x 0.5cm. There was severe regurgitation. - Mitral valve: No evidence of  vegetation. There was mild   regurgitation. - Left atrium: The atrium was dilated. No evidence of thrombus in   the appendage. - Right atrium: No evidence of thrombus in the atrial cavity or   appendage. - Atrial septum: No defect or patent foramen ovale was identified. - Tricuspid valve: No evidence of vegetation. - Pulmonic valve: No evidence of vegetation. - Superior vena cava: The study excluded a thrombus. - Pericardium, extracardiac: A trivial pericardial effusion was   identified.  ------------------------------------------------------------------- Study data:   Study status:  Routine.  Consent:  The risks, benefits, and alternatives to the procedure were explained to the patient and informed consent was obtained.  Procedure:  Initial setup. The patient was brought to the laboratory. Surface ECG leads were monitored. Sedation. Conscious sedation was administered by cardiology staff. Transesophageal echocardiography. An adult multiplane transesophageal probe was inserted by the attending cardiologistwithout difficulty. Image quality was adequate. Intravenous contrast (agitated saline) was administered.  Study completion:  The patient tolerated the procedure well. There were no complications.  Administered medications:   Midazolam, '4mg'$ , IV. Fentanyl, 84mg, IV.          Diagnostic transesophageal echocardiography.  2D and color Doppler.  Birthdate:  Patient birthdate: 105/10/1975  Age:  Patient is 41yr old.  Sex:  Gender: female.    BMI: 25.8 kg/m^2.  Blood pressure:     145/59  Patient status:  Inpatient.  Study date:  Study date: 05/29/2017. Study time: 08:12 AM.  Location:  Endoscopy.  -------------------------------------------------------------------  ------------------------------------------------------------------- Left ventricle:  The cavity size was normal. Wall thickness was normal. Systolic function was normal. The estimated ejection fraction was in the range  of 60% to 65%.  ------------------------------------------------------------------- Aortic valve:   Functionally bicuspid; moderately thickened leaflets. There was a vegetation. There was a large, 1.4 cm (L) x 0.7 cm (W), fixed vegetation on the noncoronary cusp. Another vegetation is present at the tip of the fusion point of right and left coronary cusp, 0.7 x 0.5cm.  Doppler:  There was severe regurgitation.  ------------------------------------------------------------------- Aorta:  The aorta was normal, not dilated, and non-diseased.  ------------------------------------------------------------------- Mitral valve:   Structurally normal valve.   Leaflet separation was normal.  No evidence of vegetation.  Doppler:  There was mild regurgitation.  ------------------------------------------------------------------- Left atrium:  The atrium was dilated.  No evidence of thrombus in the appendage. The appendage was of normal size. Emptying velocity was normal.  ------------------------------------------------------------------- Atrial septum:  No defect or patent foramen ovale was identified.   ------------------------------------------------------------------- Right ventricle:  The cavity size was normal. Wall thickness was normal. Systolic function was normal.  ------------------------------------------------------------------- Pulmonic valve:    Structurally normal valve.   Cusp separation was normal.  No evidence of vegetation.  ------------------------------------------------------------------- Tricuspid valve:   Structurally normal valve.   Leaflet separation was normal.  No evidence of vegetation.  Doppler:  There was no regurgitation.  ------------------------------------------------------------------- Pulmonary artery:   The main pulmonary artery was normal-sized.  ------------------------------------------------------------------- Right atrium:  The atrium  was normal in size.  No evidence of thrombus in the  atrial cavity or appendage.  ------------------------------------------------------------------- Pericardium:  A trivial pericardial effusion was identified.  ------------------------------------------------------------------- Systemic veins: Superior vena cava: The study excluded a thrombus.  3. Multiple extraction of tooth numbers 4, 5, 6, 12, and 15 4.  2 quadrants of alveoloplasty 5. Gross debridement of remaining dentition by Dr. Enrique Sack on 05/31/2017.   6. Aortic valve replacement with a 21 mm mechanical valve - [employer] serial number [id number] by Dr. Prescott Gum on 06/04/2017.   History of Presenting Illness: Ms. Accardo is a 41 year old female who initially presented to the ED 05/17/17 for failed oral antibiotic therapy for E.Coli positive UTI. She was subsequently admitted and treated with IV antibiotics and discharged on Cipro and Flagyl.   She was then re-admitted on 05/25/17 with a continuing fever, chills, malaise, and weight loss with evidence of bacteremia, now cleared with IV antibiotics as of 05/26/17. At that time, she began having some associated chest pain with her symptoms. Due to her recent infections and chest pain, an Echocardiogram was ordered on 05/25/17 which revealed an EF of 60% and aortic valve vegetation consistent with endocarditis. ID and CVTS were consulted.   Her admission blood cultures have grown viridans strep, with repeat cultures negative thus far. She is felt to have subacute streptococcal aortic valve endocarditis per ID.   CVTS recommends aortic valve surgery with inpatient cardiac and dental clearance including dental extraction before surgery scheduling.   Panorex xrays were completed due to dental disease which showed tooth abscesses. Dr. Enrique Sack consulted.  A TEE was done (05/29/17). Results showed LVEF 60-65% and functionally bicuspid aortic valve with vegetation and severe  aortic insufficiency.   Cardiac/coronary CT was done on 05/29/2017 which showed no evidence of coronary artery disease and an aortic valve that is trileaflet, thickened, with vegetations seen on the left and non-coronary cusps.  She denies a history of rheumatic heart disease or history of aortic valve or CAD disease in her family. She denies cardiac problems with her pregnancies.    Dr. Prescott Gum discussed the need for dental surgery prior to undergoing aortic valve replacement surgery. Because of her age, it was recommended that a mechanical rather than tissue valve be used. She was informed of the need for longterm Coumadin. Potential risks, benefits, and complications of the surgery were discussed with the patient and she agreed to proceed with surgery. Pre operative carotid duplex showed no significant internal carotid artery stenosis bilaterally.   Patient underwent dental surgery on 05/31/2017 by Dr. Lawana Chambers. She then underwent aortic valve replacement on 06/04/2017 by Dr. Prescott Gum.    Brief Hospital Course:  The patient was extubated the afternoon of surgery without difficulty. She remained afebrile and hemodynamically stable. She was on a Milrinone drip. Co ox were monitored daily and she was gradually weaned off the drip. Gordy Councilman, a line, chest tubes, and foley were removed early in the post operative course. Lopressor was started and titrated accordingly. She was started on Coumadin for mechanical aortic valve. Daily PT and INR were drawn. She will be discharged on 5 mg of Coumadin and a PT and INR will be checked on Monday 06/11/2017. Per infectious disease recommendations, she has been treated with Ceftriaxone and Gentamycin. She was continued on Ceftriaxone for Strep bacteremia post op. OR cultures as of 12/26 show no growth. She was volume over loaded and diuresed. She had ABL anemia. She did not require a post op transfusion. Last H and H was 8.3/26.7 . She  was weaned off the  insulin drip. The patient was felt surgically stable for transfer from the ICU to [facility]U for further convalescence on 06/06/2017. She continues to progress with cardiac rehab. She was ambulating on room air. She has been tolerating a diet and has had a bowel movement. Epicardial pacing wires were removed on 06/07/2017. Chest tube sutures will be removed the day of discharge. The patient is felt surgically stable for discharge today.    Latest Vital Signs: Blood pressure 119/81, pulse (!) 35, temperature 97.7 F (36.5 C), temperature source Oral, resp. rate (!) 27, height '5\' 4"'$  (1.626 m), weight 142 lb 3.2 oz (64.5 kg), last menstrual period 05/25/2017, SpO2 92 %.  Physical Exam: General- alert and comfortable  Lungs- clear without rales, wheezes  Cor- regular rate and rhythm, no murmur , gallop  Abdomen- soft, non-tender  Extremities - warm, non-tender, minimal edema  Neuro- oriented, appropriate, no focal weakness  Discharge Condition:Stable and discharged to home.  Recent laboratory studies:  Lab Results  Component Value Date   WBC 10.8 (H) 06/07/2017   HGB 8.3 (L) 06/07/2017   HCT 26.7 (L) 06/07/2017   MCV 84.2 06/07/2017   PLT 203 06/07/2017   Lab Results  Component Value Date   NA 134 (L) 06/09/2017   K 3.2 (L) 06/09/2017   CL 96 (L) 06/09/2017   CO2 27 06/09/2017   CREATININE 0.61 06/09/2017   GLUCOSE 88 06/09/2017      Diagnostic Studies: Dg Orthopantogram  Result Date: 05/27/2017 CLINICAL DATA:  Encounter for bacteremia. EXAM: ORTHOPANTOGRAM/PANORAMIC COMPARISON:  None. FINDINGS: The patient is missing multiple teeth. Dental amalgam is identified. There is a large carie involving the left upper fourth tooth. The crown of the right upper third tooth is missing and there is a large carie within the base of this tooth.Lucency around the root of the right upper third is suspected which may represent abscess. The crown of the right upper fourth tooth is also missing. The  visualized portions of the maxillary sinuses appear clear. IMPRESSION: 1. Suspect periapical abscess involving the right upper third tooth. 2. Poor dentition with loss of multiple teeth as well as bilateral upper tooth dental caries. Electronically Signed   By: Kerby Moors M.D.   On: 05/27/2017 14:36   Dg Chest 2 View  Result Date: 06/08/2017 CLINICAL DATA:  Status post aortic valve replacement EXAM: CHEST  2 VIEW COMPARISON:  June 07, 2017 FINDINGS: Central catheter tip is in the superior vena cava. No pneumothorax. There is slight left base atelectasis. Lungs elsewhere clear. Heart is mildly enlarged with pulmonary vascularity within normal limits. There is an aortic valve replacement. Mild separation of the upper manubrium is noted with wire in place in this area. IMPRESSION: Slight left base atelectasis. No edema or consolidation. Stable cardiac prominence. Central catheter tip in superior vena cava. No pneumothorax. Mild widening in the upper manubrium at the postoperative site noted. A sternal wire transfixes this area. Electronically Signed   By: Lowella Grip III M.D.   On: 06/08/2017 07:34   Dg Chest 2 View  Result Date: 06/07/2017 CLINICAL DATA:  Aortic valve replacement 3 days ago. EXAM: CHEST  2 VIEW COMPARISON:  Chest x-ray of June 06, 2017 FINDINGS: The lungs are adequately inflated. The retrocardiac region on the left remains dense and the hemidiaphragm obscured. The cardiac silhouette remains enlarged. The pulmonary vascularity is normal. The sternal wires are intact. The right-sided PICC line tip projects over the distal third of  the SVC. IMPRESSION: Persistent left lower lobe atelectasis or pneumonia. Interval clearing of subsegmental atelectasis at the right lung base. Stable cardiomegaly without pulmonary vascular congestion. Electronically Signed   By: David  Martinique M.D.   On: 06/07/2017 08:55   Dg Chest 2 View  Result Date: 06/02/2017 CLINICAL DATA:  Preop for  aortic valve replacement. EXAM: CHEST  2 VIEW COMPARISON:  Radiographs of May 24, 2017. FINDINGS: Stable cardiomegaly. No pneumothorax or pleural effusion is noted. Interval placement of right-sided PICC line with distal tip in expected position of the SVC. Both lungs are clear. The visualized skeletal structures are unremarkable. IMPRESSION: Stable cardiomegaly. Interval placement of right-sided PICC line. No other abnormality seen in the chest. Electronically Signed   By: Marijo Conception, M.D.   On: 06/02/2017 10:33   Dg Chest 2 View  Result Date: 05/24/2017 CLINICAL DATA:  Chest pain EXAM: CHEST  2 VIEW COMPARISON:  May 08, 2017 FINDINGS: Heart is mildly enlarged with pulmonary vascularity within normal limits. No edema or consolidation. No adenopathy. No evident bone lesions. IMPRESSION: Mild cardiac enlargement.  No edema or consolidation. Electronically Signed   By: Lowella Grip III M.D.   On: 05/24/2017 14:38   Ct Angio Chest Pe W And/or Wo Contrast  Result Date: 05/24/2017 CLINICAL DATA:  Right-sided chest pain radiating to the back for 2 days. Dyspnea. EXAM: CT ANGIOGRAPHY CHEST WITH CONTRAST TECHNIQUE: Multidetector CT imaging of the chest was performed using the standard protocol during bolus administration of intravenous contrast. Multiplanar CT image reconstructions and MIPs were obtained to evaluate the vascular anatomy. CONTRAST:  157m ISOVUE-370 IOPAMIDOL (ISOVUE-370) INJECTION 76% COMPARISON:  Radiographs 05/24/2017 at 14:31 FINDINGS: Cardiovascular: Satisfactory opacification of the pulmonary arteries to the segmental level. No evidence of pulmonary embolism. Normal heart size. No pericardial effusion. The thoracic aorta is normal in caliber and intact. Mediastinum/Nodes: No enlarged mediastinal, hilar, or axillary lymph nodes. Thyroid gland, trachea, and esophagus demonstrate no significant findings. Lungs/Pleura: Lungs are clear. No pleural effusion or pneumothorax.  Upper Abdomen: No acute abnormality. Musculoskeletal: No chest wall abnormality. No acute or significant osseous findings. Review of the MIP images confirms the above findings. IMPRESSION: Negative for pulmonary embolism.  No significant abnormality. Electronically Signed   By: DAndreas NewportM.D.   On: 05/24/2017 20:32   Dg Esophagus  Result Date: 05/30/2017 CLINICAL DATA:  Chest pain. EXAM: ESOPHOGRAM / BARIUM SWALLOW / BARIUM TABLET STUDY TECHNIQUE: Combined double contrast and single contrast examination performed using effervescent crystals, thick barium liquid, and thin barium liquid. The patient was observed with fluoroscopy swallowing a 13 mm barium sulphate tablet. FLUOROSCOPY TIME:  Fluoroscopy Time:  1 minutes 0 seconds COMPARISON:  None. FINDINGS: The mucosa and motility of the esophagus are normal. There is no hiatal hernia, stricture, mass, or esophagitis. A 13 mm barium tablet passed immediately from the mouth to the stomach with no delay. IMPRESSION: Normal barium esophagram. Electronically Signed   By: JLorriane ShireM.D.   On: 05/30/2017 08:40   [place]al  Result Date: 05/25/2017 CLINICAL DATA:  Pyelonephritis. EXAM: RENAL / URINARY TRACT ULTRASOUND COMPLETE COMPARISON:  Ultrasound 05/18/2017.  CT 05/08/2017. FINDINGS: Right Kidney: Length: 13.0 cm. Echogenicity within normal limits. No mass or hydronephrosis visualized. Left Kidney: Length: 13.4 cm. Echogenicity within normal limits. No mass or hydronephrosis visualized. Bladder: Appears normal for degree of bladder distention. 4.6 cm cyst left lower pelvis, most likely left ovarian. This is increased slightly in size from prior exam. Two internal echoes  are noted on today's exam. This may represent a hemorrhagic cyst. IMPRESSION: 1. No acute renal abnormality. No hydronephrosis or bladder distention. 2. 4.6 cm cyst left lower pelvis. This is most likely ovarian in origin. This has increased slightly in size from prior exam. Faint  internal echoes are noted within the cyst on today's exam. This this cyst may be a hemorrhagic cyst. Pregnancy test to exclude ectopic pregnancy suggested. Short-interval follow up ultrasound in 6-12 weeks is recommended, preferably during the week following the patient's normal menses. Electronically Signed   By: Marcello Moores  Register   On: 05/25/2017 12:25   US Renal  Result Date: 05/18/2017 CLINICAL DATA:  Urinary tract infection EXAM: RENAL / URINARY TRACT ULTRASOUND COMPLETE COMPARISON:  Abdominal CT 05/08/2017 FINDINGS: Right Kidney: Length: 12 cm. Echogenicity within normal limits. No mass or hydronephrosis visualized. Left Kidney: Length: 13.5 cm. Echogenicity within normal limits. No mass or hydronephrosis visualized. Bladder: Appears normal for degree of bladder distention. Bilateral ureteral jet. Simple left ovarian cyst measuring 3.1 cm, new from recent CT and thus follicular. IMPRESSION: Left nephromegaly which is a nonspecific finding that can be seen with pyelonephritis in this setting. No hydronephrosis or abscess. Electronically Signed   By: Monte Fantasia M.D.   On: 05/18/2017 09:22   Ct Coronary Morph W/cta Cor W/score W/ca W/cm &/or Wo/cm  Addendum Date: 05/29/2017   ADDENDUM REPORT: 05/29/2017 17:40 CLINICAL DATA:  65 -year-old female with aortic valve endocarditis. EXAM: Cardiac/Coronary  CT TECHNIQUE: The patient was scanned on a [employer]. FINDINGS: A 120 kV prospective scan was triggered in the descending thoracic aorta at 111 HU's. Axial non-contrast 3 mm slices were carried out through the heart. The data set was analyzed on a dedicated work station and scored using the Milner. Gantry rotation speed was 250 msecs and collimation was .6 mm. 15 mg of iv Metoprolol and 0.8 mg of sl NTG was given. The 3D data set was reconstructed in 5% intervals of the 67-82 % of the R-R cycle. Diastolic phases were analyzed on a dedicated work station using MPR, MIP and VRT modes.  The patient received 80 cc of contrast. Aorta:  Normal size.  No calcifications.  No dissection. Aortic Valve: Trileaflet. Thickening and vegetations seen on the left and non-coronary cusps. Coronary Arteries:  Normal coronary origin.  Right dominance. RCA is a large dominant artery that gives rise to PDA and a small PLA. There is no plaque. Left main is a large and long artery that gives rise to LAD and LCX arteries. Ther eis no plaque. LAD is a large vessel that gives rise to a small diagonal branch. There is motion in the proximal LAD however no plaque is seen. LCX is a non-dominant artery that gives rise to one OM1 branch. There is no plaque. Other findings: Normal pulmonary vein drainage into the left atrium. Normal let atrial appendage without a thrombus. No ASD or VSD is seen. Mildly dilated pulmonary artery measuring 32 mm. IMPRESSION: 1. Coronary calcium score of 0. This was 0 percentile for age and sex matched control. 2. Normal coronary origin with right dominance. 3. There is motion in the proximal LAD, however no plaque is seen. There is no evidence of CAD. 4. Mildly dilated pulmonary artery measuring 32 mm. 5. Aortic valve is trileaflet, thickened, with vegetations seen on the left and non-coronary cusps. Electronically Signed   By: Ena Dawley   On: 05/29/2017 17:40   Result Date: 05/29/2017 EXAM: OVER-READ INTERPRETATION  CT CHEST The following report is an over-read performed by radiologist Dr. Forest Gleason [facility] on 05/29/2017. This over-read does not include interpretation of cardiac or coronary anatomy or pathology. The coronary CTA interpretation by the cardiologist is attached. COMPARISON:  05/24/2017 chest CTA. FINDINGS: Vascular: Normal aortic caliber, without dissection. No central pulmonary embolism, on this non-dedicated study. Mediastinum/Nodes: No imaged thoracic adenopathy. Lungs/Pleura: No pleural fluid. Lingular atelectasis. 4 mm right middle lobe pulmonary  nodule on image 18/series 13. Upper Abdomen: Normal imaged portions of the liver, spleen, stomach, adrenal glands, left kidney. Musculoskeletal: No acute osseous abnormality. IMPRESSION: 1.  No acute findings in the imaged extracardiac chest. 2. Isolated 4 mm right middle lobe pulmonary nodule. No follow-up needed if patient is low-risk. Non-contrast chest CT can be considered in 12 months if patient is high-risk. This recommendation follows the consensus statement: Guidelines for Management of Incidental Pulmonary Nodules Detected on CT Images: From the Fleischner Society 2017; Radiology 2017; 284:228-243. Electronically Signed: By: Abigail Miyamoto M.D. On: 05/29/2017 16:57   Dg Chest Port 1 View  Result Date: 06/06/2017 CLINICAL DATA:  Left lower lobe atelectasis. Status post aortic valve replacement. EXAM: PORTABLE CHEST 1 VIEW COMPARISON:  06/05/2017 FINDINGS: Swan-Ganz catheter has been removed. Sheath remains. PICC remains. Chest tube has been removed. There is persistent atelectasis at the left lung base. Slight new atelectasis at the right base. Lungs are otherwise clear. Cardiomegaly. No pneumothorax. Pulmonary vascularity is normal. Prosthetic aortic valve noted. IMPRESSION: Persistent left base atelectasis. New slight atelectasis at the right base. Electronically Signed   By: Lorriane Shire M.D.   On: 06/06/2017 07:24   Dg Chest Port 1 View  Result Date: 06/05/2017 CLINICAL DATA:  Right side chest tube. EXAM: PORTABLE CHEST 1 VIEW COMPARISON:  06/04/2017 FINDINGS: Interval extubation. A right PICC line and Swan-Ganz catheter remain in place. Cardiomegaly with vascular congestion and left lower lobe atelectasis. No pneumothorax. IMPRESSION: Interval extubation. Cardiomegaly, vascular congestion and left lower lobe atelectasis. Electronically Signed   By: Rolm Baptise M.D.   On: 06/05/2017 07:34   Dg Chest Port 1 View  Addendum Date: 06/04/2017   ADDENDUM REPORT: 06/04/2017 12:57 ADDENDUM: The  original report was by Dr. Van Clines. The following addendum is by Dr. Van Clines: Critical Value/emergent results were called by telephone at the time of interpretation on 06/04/2017 at 12:37 pm to [place], who verbally acknowledged these results. Electronically Signed   By: Van Clines M.D.   On: 06/04/2017 12:57   Result Date: 06/04/2017 CLINICAL DATA:  Aortic valve replacement, postop day 0. EXAM: PORTABLE CHEST 1 VIEW COMPARISON:  06/02/2017 FINDINGS: The endotracheal tube is low in position, at the level of the carina and potentially slightly selecting the right mainstem bronchus. Retraction of 3 cm recommended. Right IJ Swan-Ganz catheter tip is in the right pulmonary artery. Right PICC line tip:  Lower SVC. Mediastinal drain noted.  Epicardial pacer leads. Aortic valve prosthesis noted. Low lung volumes are present, causing crowding of the pulmonary vasculature. Mild to moderate enlargement of the cardiopericardial silhouette. Subtle interstitial accentuation and mild bibasilar subsegmental atelectasis. No appreciable pneumothorax. IMPRESSION: 1. Endotracheal tube is low in position, and potentially selecting the right mainstem bronchus. Retraction of 3 cm recommended. 2. Remaining tubes and lines appear satisfactorily position. 3. Mild-to-moderate enlargement of the cardiopericardial silhouette 4. Faint interstitial accentuation, borderline appearance for interstitial edema. 5. Mild bibasilar subsegmental atelectasis. Radiology assistant personnel have been notified to put me in telephone contact  with the referring physician or the referring physician's clinical representative in order to discuss these findings. Once this communication is established I will issue an addendum to this report for documentation purposes. Electronically Signed: By: Van Clines M.D. On: 06/04/2017 12:34   Dg Foot Complete Right  Result Date: 06/03/2017 CLINICAL DATA:  Dorsal right foot  pain especially with weight-bearing or moving of the toes. Gout. EXAM: RIGHT FOOT COMPLETE - 3+ VIEW COMPARISON:  07/12/2009 ankle radiographs FINDINGS: 4 mm central diaphyseal lesion of the proximal phalanx third toe with peripheral sclerosis and central lucency. 3 mm lucent lesion in the distal metadiaphysis of the proximal phalanx of the small toe. Neither of these is marginal to an articular surface. No malalignment at the Lisfranc joint. No erosion along the first MTP joint or definite other periarticular erosion in the forefoot. Dorsal subcutaneous edema along the forefoot. IMPRESSION: 1. Dorsal subcutaneous edema along the forefoot, but without articular or periarticular erosion. 2. Small lucent lesions in the proximal phalanges of the third and fifth toes. The third toe proximal phalanx lesion has some marginal sclerosis and is most likely to be a incidental benign lesion such as a enchondroma, hemangioma, or old nonossifying fibroma. These tiny fifth toe proximal phalanx jail lucent lesion likewise has benign imaging characteristics and is likely incidental. Electronically Signed   By: Van Clines M.D.   On: 06/03/2017 12:05   Discharge Instructions    Amb Referral to Cardiac Rehabilitation   Complete by:  As directed    Diagnosis:  Valve Replacement   Valve:  Aortic   Discharge patient   Complete by:  As directed    Discharge disposition:  01-Home or Self Care   Discharge patient date:  06/09/2017   Home infusion instructions [facility] May follow [place] Dosing Protocol; May administer Cathflo as needed to maintain patency of vascular access device.; Flushing of vascular access device: per [facility] Protocol: 0.9% NaCl pre/post medica...   Complete by:  As directed    Instructions:  May follow [place] Dosing Protocol   Instructions:  May administer Cathflo as needed to maintain patency of vascular access device.   Instructions:  Flushing of vascular access device: per [facility]  Protocol: 0.9% NaCl pre/post medication administration and prn patency; Heparin 100 u/ml, 80m for implanted ports and Heparin 10u/ml, 560mfor all other central venous catheters.   Instructions:  May follow [facility] Anaphylaxis Protocol for First Dose Administration in the home: 0.9% NaCl at 25-50 ml/hr to maintain IV access for protocol meds. Epinephrine 0.3 ml IV/IM PRN and Benadryl 25-50 IV/IM PRN s/s of anaphylaxis.   Instructions:  [facility] Coordinator (RN) to assist per patient IV care needs in the home PRN.      Discharge Medications: Allergies as of 06/09/2017   No Known Allergies     Medication List    STOP taking these medications   aspirin-acetaminophen-caffeine 250-250-65 MG tablet Commonly known as:  EXCEDRIN MIGRAINE   ciprofloxacin 500 MG tablet Commonly known as:  CIPRO   metroNIDAZOLE 500 MG tablet Commonly known as:  FLAGYL   ondansetron 4 MG disintegrating tablet Commonly known as:  ZOFRAN ODT     TAKE these medications   aspirin 81 MG EC tablet Take 1 tablet (81 mg total) by mouth daily. Start taking on:  06/10/2017   cefTRIAXone IVPB Commonly known as:  ROCEPHIN Inject 2 g into the vein daily. Indication: endocarditis Last Day of Therapy:  07/01/17 Labs -  Once weekly:  CBC/D and BMP, Labs - Every other week:  ESR and CRP   gentamicin IVPB Commonly known as:  GARAMYCIN Inject 200 mg into the vein daily for 10 days. Indication:  endocarditis Last Day of Therapy:  06/17/17 Labs - 'Sunday/Monday:  CBC/D, BMP, and gentamicin trough. Labs - Thursday:  BMP and gentamicin trough Labs - Every other week:  ESR and CRP   metoprolol tartrate 25 MG tablet Commonly known as:  LOPRESSOR Take 1 tablet (25 mg total) by mouth 2 (two) times daily.   oxyCODONE 5 MG immediate release tablet Commonly known as:  Oxy IR/ROXICODONE Take 1 tablet (5 mg total) by mouth every 6 (six) hours as needed for severe pain.   warfarin 5 MG tablet Commonly known as:   COUMADIN Take 1 tablet (5 mg total) by mouth daily at 6 PM. As directed by the coumadin clinic            Home Infusion Instuctions  (From admission, onward)        Start     Ordered   06/09/17 0000  Home infusion instructions [facility] May follow [facility] Dosing Protocol; May administer Cathflo as needed to maintain patency of vascular access device.; Flushing of vascular access device: per [facility] Protocol: 0.9% NaCl pre/post medica...    Question Answer Comment  Instructions May follow [facility] Dosing Protocol   Instructions May administer Cathflo as needed to maintain patency of vascular access device.   Instructions Flushing of vascular access device: per [facility] Protocol: 0.9% NaCl pre/post medication administration and prn patency; Heparin 100 u/ml, 5ml for implanted ports and Heparin 10u/ml, 5ml for all other central venous catheters.   Instructions May follow [facility] Anaphylaxis Protocol for First Dose Administration in the home: 0.9% NaCl at 25-50 ml/hr to maintain IV access for protocol meds. Epinephrine 0.3 ml IV/IM PRN and Benadryl 25-50 IV/IM PRN s/s of anaphylaxis.   Instructions [facility] Infusion Coordinator (RN) to assist per patient IV care needs in the home PRN.      12'$ /29/18 9371     The patient has been discharged on:   1.Beta Blocker:  Yes [ x  ]                              No   [   ]                              If No, reason:  2.Ace Inhibitor/ARB: Yes [   ]                                     No  [   n ]                                     If No, reason:low relative BP  3.Statin:   Yes [   ]                  No  [  x ]                  If No, reason:No CAD  4.Shela CommonsVelta Addison  [ x  ]  No   [   ]                  If No, reason:  Follow Up Appointments: Follow-up Information    Call Lenn Cal, DDS.   Specialty:  Dentistry Why:  For wound re-check in January once discharged from hospital  Contact  information: [place] [address] [phone]        [place], Peter M, MD Follow up.   Specialty:  Cardiology Contact information: [address] [place] [address] [phone]        Ivin Poot, MD. Go on 07/11/2017.   Specialty:  Cardiothoracic Surgery Why:  PA/LAT CXR to be taken (at [place] which is in the same building as Dr. Lucianne Lei Trigt's office)on 07/11/2017 at 12:00 pm;Appointment time is at 12:30 pm Contact information: [address] [place] [address] [place] Office. Go on 06/11/2017.   Specialty:  Cardiology Why:  Appointment is to have PT and INR drawn (on Coumadin for mechanical aortic valve replacment, INR 2-3). Appointment time is at  [employer] information: [address] [place] Follow up.   Specialty:  [place] Why:  [facility] for IV abx needs Contact information: [address] [place] [address] [phone]           Signed: Gaspar Bidding 06/09/2017, 9:50 AM

## 2017-06-06 NOTE — Care Management Note (Signed)
Case Management Note  Patient Details  Name: Krystal Shaffer MRN: [id number] Date of Birth: 1975-09-07  Subjective/Objective:   From home with her 40 yo son, POD 2 AVR, cont to diuresis, plan for home with iv abx, she chose [facility], referral given to [place],                  Action/Plan: DC home with IV ABX with [facility] when medically ready.  Expected Discharge Date:                  Expected Discharge Plan:  [place]  In-House Referral:     Discharge planning Services  CM Consult  Post Acute Care Choice:  Home Health Choice offered to:  Patient  DME Arranged:  IV pump/equipment DME Agency:  [place] Arranged:  RN [facility] Agency:  [place]  Status of Service:  Completed, signed off  If discussed at [place] of Stay Meetings, dates discussed:    Additional Comments:  Zenon Mayo, RN 06/06/2017, 11:23 AM

## 2017-06-06 NOTE — Progress Notes (Signed)
Transferred to 4e24 with no distress noted. Alert and orientedX4, Family at bedside and aware. Report given to receiving nurse. Will transfer care at this time.

## 2017-06-07 ENCOUNTER — Inpatient Hospital Stay (HOSPITAL_COMMUNITY): Payer: 59

## 2017-06-07 LAB — TYPE AND SCREEN
ABO/RH(D): B POS
Antibody Screen: NEGATIVE
Unit division: 0
Unit division: 0
Unit division: 0
Unit division: 0
Unit division: 0
Unit division: 0

## 2017-06-07 LAB — PROTIME-INR
INR: 1.16
Prothrombin Time: 14.7 seconds (ref 11.4–15.2)

## 2017-06-07 LAB — BPAM RBC
Blood Product Expiration Date: 201901182359
Blood Product Expiration Date: 201901182359
Blood Product Expiration Date: 201901212359
Blood Product Expiration Date: 201901222359
Blood Product Expiration Date: 201901222359
Blood Product Expiration Date: 201901242359
ISSUE DATE / TIME: 201812240817
ISSUE DATE / TIME: 201812240817
ISSUE DATE / TIME: 201812241033
ISSUE DATE / TIME: 201812241033
Unit Type and Rh: 7300
Unit Type and Rh: 7300
Unit Type and Rh: 7300
Unit Type and Rh: 7300
Unit Type and Rh: 7300
Unit Type and Rh: 7300

## 2017-06-07 LAB — CBC
HCT: 26.7 % — ABNORMAL LOW (ref 36.0–46.0)
Hemoglobin: 8.3 g/dL — ABNORMAL LOW (ref 12.0–15.0)
MCH: 26.2 pg (ref 26.0–34.0)
MCHC: 31.1 g/dL (ref 30.0–36.0)
MCV: 84.2 fL (ref 78.0–100.0)
Platelets: 203 10*3/uL (ref 150–400)
RBC: 3.17 MIL/uL — ABNORMAL LOW (ref 3.87–5.11)
RDW: 15.1 % (ref 11.5–15.5)
WBC: 10.8 10*3/uL — ABNORMAL HIGH (ref 4.0–10.5)

## 2017-06-07 LAB — BASIC METABOLIC PANEL
Anion gap: 9 (ref 5–15)
BUN: 7 mg/dL (ref 6–20)
CO2: 26 mmol/L (ref 22–32)
Calcium: 7.4 mg/dL — ABNORMAL LOW (ref 8.9–10.3)
Chloride: 99 mmol/L — ABNORMAL LOW (ref 101–111)
Creatinine, Ser: 0.62 mg/dL (ref 0.44–1.00)
GFR calc Af Amer: >60 mL/min (ref >60)
GFR calc non Af Amer: >60 mL/min (ref >60)
Glucose, Bld: 117 mg/dL — ABNORMAL HIGH (ref 65–99)
Potassium: 3.2 mmol/L — ABNORMAL LOW (ref 3.5–5.1)
Sodium: 134 mmol/L — ABNORMAL LOW (ref 135–145)

## 2017-06-07 MED ORDER — PATIENT'S GUIDE TO USING COUMADIN BOOK
Freq: Once | Status: AC
  Administered 2017-06-07: 1
  Filled 2017-06-07: qty 1

## 2017-06-07 MED ORDER — POTASSIUM CHLORIDE CRYS ER 20 MEQ PO TBCR
30.0000 meq | EXTENDED_RELEASE_TABLET | Freq: Two times a day (BID) | ORAL | Status: DC
  Administered 2017-06-07 (×2): 30 meq via ORAL
  Filled 2017-06-07 (×2): qty 1

## 2017-06-07 MED ORDER — LOPERAMIDE HCL 2 MG PO CAPS
2.0000 mg | ORAL_CAPSULE | ORAL | Status: DC | PRN
Start: 2017-06-07 — End: 2017-06-09
  Administered 2017-06-07 – 2017-06-08 (×4): 2 mg via ORAL
  Filled 2017-06-07 (×4): qty 1

## 2017-06-07 MED ORDER — ENOXAPARIN SODIUM 40 MG/0.4ML ~~LOC~~ SOLN
40.0000 mg | Freq: Every day | SUBCUTANEOUS | Status: DC
  Administered 2017-06-07 – 2017-06-09 (×3): 40 mg via SUBCUTANEOUS
  Filled 2017-06-07 (×3): qty 0.4

## 2017-06-07 MED ORDER — WARFARIN VIDEO: Freq: Once | Status: DC

## 2017-06-07 MED ORDER — WARFARIN SODIUM 5 MG PO TABS
5.0000 mg | ORAL_TABLET | Freq: Every day | ORAL | Status: DC
  Administered 2017-06-07: 5 mg via ORAL
  Filled 2017-06-07: qty 1

## 2017-06-07 MED FILL — Heparin Sodium (Porcine) Inj 1000 Unit/ML: INTRAMUSCULAR | Qty: 10 | Status: AC

## 2017-06-07 MED FILL — Electrolyte-R (PH 7.4) Solution: INTRAVENOUS | Qty: 3000 | Status: AC

## 2017-06-07 MED FILL — Lidocaine HCl IV Inj 20 MG/ML: INTRAVENOUS | Qty: 5 | Status: AC

## 2017-06-07 MED FILL — Sodium Chloride IV Soln 0.9%: INTRAVENOUS | Qty: 2000 | Status: AC

## 2017-06-07 MED FILL — Sodium Bicarbonate IV Soln 8.4%: INTRAVENOUS | Qty: 50 | Status: AC

## 2017-06-07 MED FILL — Mannitol IV Soln 20%: INTRAVENOUS | Qty: 500 | Status: AC

## 2017-06-07 NOTE — Progress Notes (Signed)
CARDIAC REHAB PHASE I   PRE:  Rate/Rhythm: 103 ST  BP:  Supine:   Sitting: 112/84  Standing:    SaO2: 96%RA  MODE:  Ambulation: 800 ft   POST:  Rate/Rhythm: 106 ST  BP:  Supine:   Sitting: 118/87  Standing:    SaO2: 94%RA 1100-1122 Pt walked 800 ft pushing IV pole with steady gait. Tolerated well. Did not have to stop and rest. To bathroom after walk.   Graylon Good, RN BSN  06/07/2017 11:20 AM

## 2017-06-07 NOTE — Progress Notes (Signed)
Epicardial pacing wires removed, per order. Wire ends intact. Vitals stable. Sites clean and dry. Pt tolerated well. Bedrest x1 hour. Will continue to monitor.   Grant Fontana BSN, RN

## 2017-06-07 NOTE — Progress Notes (Addendum)
      [address]       [place], [address]             [phone]      3 Days Post-Op Procedure(s) (LRB): AORTIC VALVE REPLACEMENT (AVR) (N/A) TRANSESOPHAGEAL ECHOCARDIOGRAM (TEE) (N/A)   Subjective:  No new complaints.  Feels good, hoping to go home soon.  She also states there is something about her disability paperwork that needs to be completed today.    Objective: Vital signs in last 24 hours: Temp:  [98.5 F (36.9 C)-99.3 F (37.4 C)] 99.3 F (37.4 C) (12/27 0457) Pulse Rate:  [11-110] 110 (12/27 0457) Cardiac Rhythm: Sinus tachycardia (12/27 0457) Resp:  [16-28] 22 (12/27 0457) BP: (107-144)/(63-99) 122/90 (12/27 0457) SpO2:  [92 %-98 %] 92 % (12/27 0457) Weight:  [149 lb 6.4 oz (67.8 kg)] 149 lb 6.4 oz (67.8 kg) (12/27 0457)  Intake/Output from previous day: 12/26 0701 - 12/27 0700 In: 525 [P.O.:120; I.V.:300; IV Piggyback:105] Out: 1150 [Urine:1150]  General appearance: alert, cooperative and no distress Heart: regular rate and rhythm Lungs: clear to auscultation bilaterally Abdomen: soft, non-tender; bowel sounds normal; no masses,  no organomegaly Extremities: edema trace Wound: clean and dry  Lab Results: Recent Labs    06/06/17 0408 06/07/17 0458  WBC 9.1 10.8*  HGB 8.8* 8.3*  HCT 26.9* 26.7*  PLT 168 203   BMET:  Recent Labs    06/06/17 0408 06/07/17 0458  NA 131* 134*  K 4.2 3.2*  CL 98* 99*  CO2 25 26  GLUCOSE 85 117*  BUN 10 7  CREATININE 0.74 0.62  CALCIUM 8.3* 7.4*    PT/INR:  Recent Labs    06/07/17 0458  LABPROT 14.7  INR 1.16   ABG    Component Value Date/Time   PHART 7.325 (L) 06/04/2017 1658   HCO3 22.5 06/04/2017 1658   TCO2 26 06/05/2017 1942   ACIDBASEDEF 3.0 (H) 06/04/2017 1658   O2SAT 68.2 06/06/2017 0429   CBG (last 3)  Recent Labs    06/05/17 0327 06/05/17 1214 06/05/17 1604  GLUCAP 101* 113* 100*    Assessment/Plan: S/P Procedure(s) (LRB): AORTIC VALVE REPLACEMENT (AVR)  (N/A) TRANSESOPHAGEAL ECHOCARDIOGRAM (TEE) (N/A)  1. CV- Sinus Tach, mild HTN- will increase Lopressor to 50 mg BID 2. INR 1.16, will repeat 4 mg of coumadin this evening if no response will increase dose tomorrow 3. Pulm- no acute issues, continue IS 4. Renal- creatinine WNL, weight is trending down, continue Lasix for now 5. Hypokalemia- 3.2, will supplement 6. Dispo- patient stable, INR is at 1.16, continue coumadin, will d/c EPW today, remains tachy will increase Lopressor to 50 mg BID   LOS: 12 days    Erin Barrett 06/07/2017  Home when INR 1.8 No lasix at home with poss gout in R foot HHN face to face done for IV antibiotics patient examined and medical record reviewed,agree with above note. Tharon Aquas Trigt III 06/07/2017

## 2017-06-07 NOTE — Care Management Note (Signed)
Case Management Note Original Note Created Zenon Mayo, RN 06/06/2017, 11:23 AM   Patient Details  Name: Krystal Shaffer MRN: [id number] Date of Birth: 11-03-1975  Subjective/Objective:   From home with her 40 yo son, POD 2 AVR, cont to diuresis, plan for home with iv abx, she chose [facility], referral given to [place],                  Action/Plan: DC home with IV ABX with [facility] when medically ready.  Expected Discharge Date:                  Expected Discharge Plan:  [place]  In-House Referral:  NA  Discharge planning Services  CM Consult  Post Acute Care Choice:  Home Health Choice offered to:  Patient  DME Arranged:  IV pump/equipment DME Agency:  [place] Arranged:  RN, IV Antibiotics HH Agency:  [place]  Status of Service:  Completed, signed off  If discussed at [place] of Stay Meetings, dates discussed:    Discharge Disposition: home/home health   Additional Comments:  06/07/17- 1015-Saleha Kalp RN, CM- order placed for [facility] - pt will need IV abx through Jan 21- spoke with pt at bedside confirmed choice for [facility] as agency to provide [facility] services for IV abx needs- discussed other potential transition needs- pt states she does not need any DME- pt does request education on coumadin will f/u with pharmacy to make sure they see pt for coumadin education. -  referral has already been called to [facility] with [facility] for [facility] needs and IV abx- pt will need OPAT order or IV abx script for [facility] to process for home IV abx needs prior to discharge. CM will continue to follow.    Dahlia Client [place], RN 06/07/2017, 10:15 AM [phone]

## 2017-06-08 ENCOUNTER — Inpatient Hospital Stay (HOSPITAL_COMMUNITY): Payer: 59

## 2017-06-08 LAB — PROTIME-INR
INR: 1.41
Prothrombin Time: 17.2 seconds — ABNORMAL HIGH (ref 11.4–15.2)

## 2017-06-08 LAB — GENTAMICIN LEVEL, TROUGH: Gentamicin Trough: <0.5 ug/mL — ABNORMAL LOW (ref 0.5–2.0)

## 2017-06-08 MED ORDER — ASPIRIN 81 MG PO CHEW: 81.0000 mg | CHEWABLE_TABLET | Freq: Every day | ORAL | Status: DC

## 2017-06-08 MED ORDER — ASPIRIN EC 81 MG PO TBEC
81.0000 mg | DELAYED_RELEASE_TABLET | Freq: Every day | ORAL | Status: DC
  Administered 2017-06-08 – 2017-06-09 (×2): 81 mg via ORAL
  Filled 2017-06-08 (×2): qty 1

## 2017-06-08 MED ORDER — WARFARIN SODIUM 4 MG PO TABS
4.0000 mg | ORAL_TABLET | Freq: Every day | ORAL | Status: DC
  Administered 2017-06-08: 4 mg via ORAL
  Filled 2017-06-08: qty 1

## 2017-06-08 MED ORDER — POTASSIUM CHLORIDE CRYS ER 20 MEQ PO TBCR
20.0000 meq | EXTENDED_RELEASE_TABLET | Freq: Every day | ORAL | Status: DC
  Administered 2017-06-08 – 2017-06-09 (×2): 20 meq via ORAL
  Filled 2017-06-08 (×2): qty 1

## 2017-06-08 NOTE — Progress Notes (Signed)
CARDIAC REHAB PHASE I   PRE:  Rate/Rhythm: 87 SR  BP:  Supine:   Sitting: 124/91  Standing:    SaO2: 98%RA  MODE:  Ambulation: 800 ft   POST:  Rate/Rhythm: 98   BP:  Supine:   Sitting: 111/93  Standing:    SaO2: 98%RA 1040-1122 Pt walked 800 ft with steady gait and tolerated well. She states that her right foot is sore. It looks a little pink and slightly swollen. Stated it felt a little better the more she walked on it. Education completed with pt who voiced understanding. Reviewed IS, sternal precautions, ex ed and encouraged her to watch sodium and Vitamin K. She stated she has seen pharmacist and got Coumadin handout. Discussed CRP 2 and will refer to [place].   Graylon Good, RN BSN  06/08/2017 11:18 AM

## 2017-06-08 NOTE — Progress Notes (Signed)
Pharmacy Antibiotic Note  Krystal Shaffer is a 41 y.o. female admitted on 05/24/2017 with strep bacteremia and aortic valve endocarditis.  Pharmacy has been consulted for Rocephin and gentamicin dosing.  Gent trough remains <0.5, at goal.  Plan: This patient's current antibiotics will be continued without adjustments.  Height: 5\' 4"  (162.6 cm) Weight: 149 lb 6.4 oz (67.8 kg) IBW/kg (Calculated) : 54.7  Temp (24hrs), Avg:99.6 F (37.6 C), Min:99.2 F (37.3 C), Max:100 F (37.8 C)  Recent Labs  Lab 06/04/17 1747  06/05/17 0502 06/05/17 1712 06/05/17 1942 06/06/17 0408 06/07/17 0458 06/07/17 2045  WBC 10.8*  --  12.0* 9.7  --  9.1 10.8*  --   CREATININE 0.59   < > 0.54 0.70 0.60 0.74 0.62  --   GENTTROUGH  --   --   --   --   --   --   --  <0.5*   < > = values in this interval not displayed.    Estimated Creatinine Clearance: 88.4 mL/min (by C-G formula based on SCr of 0.62 mg/dL).    No Known Allergies   Thank you for allowing pharmacy to be a part of this patient's care.  Wynona Neat, PharmD, BCPS  06/08/2017 3:40 AM

## 2017-06-08 NOTE — Progress Notes (Signed)
Pt has complained of diarrhea throughout the shift.  Three imodium pills were given and did not have an effect.  It looked like there might have been some blood in the stool seen by nurse.  Will inform the on coming day nurse.  Lupita Dawn, RN

## 2017-06-08 NOTE — Progress Notes (Addendum)
      [address]       [place] [address]             [phone]      4 Days Post-Op Procedure(s) (LRB): AORTIC VALVE REPLACEMENT (AVR) (N/A) TRANSESOPHAGEAL ECHOCARDIOGRAM (TEE) (N/A)   Subjective:  No new complaints.  States diarrhea has improved some with imodium.    Objective: Vital signs in last 24 hours: Temp:  [98.3 F (36.8 C)-100 F (37.8 C)] 98.3 F (36.8 C) (12/28 0349) Pulse Rate:  [90-105] 90 (12/28 0349) Cardiac Rhythm: Sinus tachycardia (12/27 1930) Resp:  [12-21] 12 (12/28 0349) BP: (107-139)/(80-92) 114/80 (12/28 0349) SpO2:  [98 %-100 %] 98 % (12/28 0349) Weight:  [145 lb 8 oz (66 kg)] 145 lb 8 oz (66 kg) (12/28 0349)  Intake/Output from previous day: 12/27 0701 - 12/28 0700 In: 285 [P.O.:150; I.V.:35; IV Piggyback:100] Out: -   General appearance: alert, cooperative and no distress Heart: regular rate and rhythm, + valve click Lungs: clear to auscultation bilaterally Abdomen: soft, non-tender; bowel sounds normal; no masses,  no organomegaly Extremities: edema trace Wound: clean and dry  Lab Results: Recent Labs    06/06/17 0408 06/07/17 0458  WBC 9.1 10.8*  HGB 8.8* 8.3*  HCT 26.9* 26.7*  PLT 168 203   BMET:  Recent Labs    06/06/17 0408 06/07/17 0458  NA 131* 134*  K 4.2 3.2*  CL 98* 99*  CO2 25 26  GLUCOSE 85 117*  BUN 10 7  CREATININE 0.74 0.62  CALCIUM 8.3* 7.4*    PT/INR:  Recent Labs    06/08/17 0419  LABPROT 17.2*  INR 1.41   ABG    Component Value Date/Time   PHART 7.325 (L) 06/04/2017 1658   HCO3 22.5 06/04/2017 1658   TCO2 26 06/05/2017 1942   ACIDBASEDEF 3.0 (H) 06/04/2017 1658   O2SAT 68.2 06/06/2017 0429   CBG (last 3)  Recent Labs    06/05/17 1214 06/05/17 1604  GLUCAP 113* 100*    Assessment/Plan: S/P Procedure(s) (LRB): AORTIC VALVE REPLACEMENT (AVR) (N/A) TRANSESOPHAGEAL ECHOCARDIOGRAM (TEE) (N/A)  1. CV- remains tachy, BP improved, continue Lopressor at 50 mg BID 2.  INR 1.41, will continue coumadin at 4 mg daily 3. Pulm- no acute issues, continue IS 4. Renal- creatinine has been stable, weight is 5 lbs below admission, continues to have LE edema, continue Lasix for now repeat BMET in AM 5. GI- diarrhea, resolving with imodium, all stool softeners stopped 6. ID remains afebrile, H/H arranged, continue ABX per ID recommendations 7. Dispo- patient stable, home in AM if INR is therapeutic   LOS: 13 days    Erin Barrett 06/08/2017 Pain well controlled- no fentanyl patch  In discharge Rx Nsr, EPWs out  INR slowly increasing oK to DC when INR 1.8 HHN for INR Mon, home antibiotics for endocarditis  patient examined and medical record reviewed,agree with above note. Tharon Aquas Trigt III 06/08/2017

## 2017-06-08 NOTE — Discharge Instructions (Signed)
MOUTH CARE AFTER SURGERY ° °FACTS: °· Ice used in ice bag helps keep the swelling down, and can help lessen the pain. °· It is easier to treat pain BEFORE it happens. °· Spitting disturbs the clot and may cause bleeding to start again, or to get worse. °· Smoking delays healing and can cause complications. °· Sharing prescriptions can be dangerous.  Do not take medications not recently prescribed for you. °· Antibiotics may stop birth control pills from working.  Use other means of birth control while on antibiotics. °· Warm salt water rinses after the first 24 hours will help lessen the swelling:  Use 1/2 teaspoonful of table salt per oz.of water. ° °DO NOT: °· Do not spit.  Do not drink through a straw. °· Strongly advised not to smoke, dip snuff or chew tobacco at least for 3 days. °· Do not eat sharp or crunchy foods.  Avoid the area of surgery when chewing. °· Do not stop your antibiotics before your instructions say to do so. °· Do not eat hot foods until bleeding has stopped.  If you need to, let your food cool down to room temperature. ° °EXPECT: °· Some swelling, especially first 2-3 days. °· Soreness or discomfort in varying degrees.  Follow your dentist's instructions about how to handle pain before it starts. °· Pinkish saliva or light blood in saliva, or on your pillow in the morning.  This can last around 24 hours. °· Bruising inside or outside the mouth.  This may not show up until 2-3 days after surgery.  Don't worry, it will go away in time. °· Pieces of "bone" may work themselves loose.  It's OK.  If they bother you, let us know. ° °WHAT TO DO IMMEDIATELY AFTER SURGERY: °· Bite on the gauze with steady pressure for 1-2 hours.  Don't chew on the gauze. °· Do not lie down flat.  Raise your head support especially for the first 24 hours. °· Apply ice to your face on the side of the surgery.  You may apply it 20 minutes on and a few minutes off.  Ice for 8-12 hours.  You may use ice up to 24  hours. °· Before the numbness wears off, take a pain pill as instructed. °· Prescription pain medication is not always required. ° °SWELLING: °· Expect swelling for the first couple of days.  It should get better after that. °· If swelling increases 3 days or so after surgery; let us know as soon as possible. ° °FEVER: °· Take Tylenol every 4 hours if needed to lower your temperature, especially if it is at 100F or higher. °· Drink lots of fluids. °· If the fever does not go away, let us know. ° °BREATHING TROUBLE: °· Any unusual difficulty breathing means you have to have someone bring you to the emergency room ASAP ° °BLEEDING: °· Light oozing is expected for 24 hours or so. °· Prop head up with pillows °· Avoid spitting °· Do not confuse bright red fresh flowing blood with lots of saliva colored with a little bit of blood. °· If you notice some bleeding, place gauze or a tea bag where it is bleeding and apply CONSTANT pressure by biting down for 1 hour.  Avoid talking during this time.  Do not remove the gauze or tea bag during this hour to "check" the bleeding. °· If you notice bright RED bleeding FLOWING out of particular area, and filling the floor of your mouth, put   a wad of gauze on that area, bite down firmly and constantly.  Call us immediately.  If we're closed, have someone bring you to the emergency room.  ORAL HYGIENE:  Brush your teeth as usual after meals and before bedtime.  Use a soft toothbrush around the area of surgery.  DO NOT AVOID BRUSHING.  Otherwise bacteria(germs) will grow and may delay healing or encourage infection.  Since you cannot spit, just gently rinse and let the water flow out of your mouth.  DO NOT SWISH HARD.  EATING:  Cool liquids are a good point to start.  Increase to soft foods as tolerated.  PRESCRIPTIONS:  Follow the directions for your prescriptions exactly as written.  If Dr. Enrique Sack gave you a narcotic pain medication, do not drive, operate  machinery or drink alcohol when on that medication.  QUESTIONS:  Call our office during office hours [phone] or call the Emergency Room at [phone].   Aortic Valve Replacement, Care After Refer to this sheet in the next few weeks. These instructions provide you with information about caring for yourself after your procedure. Your health care provider may also give you more specific instructions. Your treatment has been planned according to current medical practices, but problems sometimes occur. Call your health care provider if you have any problems or questions after your procedure. What can I expect after the procedure? After the procedure, it is common to have:  Pain around your incision area.  A small amount of blood or clear fluid coming from your incision.  Follow these instructions at home: Eating and drinking   Follow instructions from your health care provider about eating or drinking restrictions. ? Limit alcohol intake to no more than 1 drink per day for nonpregnant women and 2 drinks per day for men. One drink equals 12 oz of beer, 5 oz of wine, or 1 oz of hard liquor. ? Limit how much caffeine you drink. Caffeine can affect your heart's rate and rhythm.  Drink enough fluid to keep your urine clear or pale yellow.  Eat a heart-healthy diet. This should include plenty of fresh fruits and vegetables. If you eat meat, it should be lean cuts. Avoid foods that are: ? High in salt, saturated fat, or sugar. ? Canned or highly processed. ? Fried. Activity  Return to your normal activities as told by your health care provider. Ask your health care provider what activities are safe for you.  Exercise regularly once you have recovered, as told by your health care provider.  Avoid sitting for more than 2 hours at a time without moving. Get up and move around at least once every 1-2 hours. This helps to prevent blood clots in the legs.  Do not lift anything that is  heavier than 10 lb (4.5 kg) until your health care provider approves.  Avoid pushing or pulling things with your arms until your health care provider approves. This includes pulling on handrails to help you climb stairs. Incision care   Follow instructions from your health care provider about how to take care of your incision. Make sure you: ? Wash your hands with soap and water before you change your bandage (dressing). If soap and water are not available, use hand sanitizer. ? Change your dressing as told by your health care provider. ? Leave stitches (sutures), skin glue, or adhesive strips in place. These skin closures may need to stay in place for 2 weeks or longer. If adhesive strip edges start to  loosen and curl up, you may trim the loose edges. Do not remove adhesive strips completely unless your health care provider tells you to do that.  Check your incision area every day for signs of infection. Check for: ? More redness, swelling, or pain. ? More fluid or blood. ? Warmth. ? Pus or a bad smell. Medicines  Take over-the-counter and prescription medicines only as told by your health care provider.  If you were prescribed an antibiotic medicine, take it as told by your health care provider. Do not stop taking the antibiotic even if you start to feel better. Travel  Avoid airplane travel for as long as told by your health care provider.  When you travel, bring a list of your medicines and a record of your medical history with you. Carry your medicines with you. Driving  Ask your health care provider when it is safe for you to drive. Do not drive until your health care provider approves.  Do not drive or operate heavy machinery while taking prescription pain medicine. Lifestyle   Do not use any tobacco products, such as cigarettes, chewing tobacco, or e-cigarettes. If you need help quitting, ask your health care provider.  Resume sexual activity as told by your health care  provider. Do not use medicines for erectile dysfunction unless your health care provider approves, if this applies.  Work with your health care provider to keep your blood pressure and cholesterol under control, and to manage any other heart conditions that you have.  Maintain a healthy weight. General instructions  Do not take baths, swim, or use a hot tub until your health care provider approves.  Do not strain to have a bowel movement.  Avoid crossing your legs while sitting down.  Check your temperature every day for a fever. A fever may be a sign of infection.  If you are a woman and you plan to become pregnant, talk with your health care provider before you become pregnant.  Wear compression stockings if your health care provider instructs you to do this. These stockings help to prevent blood clots and reduce swelling in your legs.  Tell all health care providers who care for you that you have an artificial (prosthetic) aortic valve. If you have or have had heart disease or endocarditis, tell all health care providers about these conditions as well.  Keep all follow-up visits as told by your health care provider. This is important. Contact a health care provider if:  You develop a skin rash.  You experience sudden, unexplained changes in your weight.  You have more redness, swelling, or pain around your incision.  You have more fluid or blood coming from your incision.  Your incision feels warm to the touch.  You have pus or a bad smell coming from your incision.  You have a fever. Get help right away if:  You develop chest pain that is different from the pain coming from your incision.  You develop shortness of breath or difficulty breathing.  You start to feel light-headed. These symptoms may represent a serious problem that is an emergency. Do not wait to see if the symptoms will go away. Get medical help right away. Call your local emergency services (911 in the  [place] Do not drive yourself to the hospital. This information is not intended to replace advice given to you by your health care provider. Make sure you discuss any questions you have with your health care provider. Document Released: 12/15/2004 Document Revised:  11/04/2015 Document Reviewed: 05/02/2015 Elsevier Interactive Patient Education  2017 [employer].  ============================================================================  Information on my medicine - Coumadin   (Warfarin)  This medication education was reviewed with me or my healthcare representative as part of my discharge preparation.  The pharmacist that spoke with me during my hospital stay was:  Arty Baumgartner, [facility]  Why was Coumadin prescribed for you? Coumadin was prescribed for you because you have a blood clot or a medical condition that can cause an increased risk of forming blood clots. Blood clots can cause serious health problems by blocking the flow of blood to the heart, lung, or brain. Coumadin can prevent harmful blood clots from forming. As a reminder your indication for Coumadin is:   Blood Clot Prevention After Heart Valve Surgery  What test will check on my response to Coumadin? While on Coumadin (warfarin) you will need to have an INR test regularly to ensure that your dose is keeping you in the desired range. The INR (international normalized ratio) number is calculated from the result of the laboratory test called prothrombin time (PT).  If an INR APPOINTMENT HAS NOT ALREADY BEEN MADE FOR YOU please schedule an appointment to have this lab work done by your health care provider within 7 days. Your INR goal is usually a number between:  2 to 3 or your provider may give you a more narrow range like 2-2.5.  Ask your health care provider during an office visit what your goal INR is.  What  do you need to  know  About  COUMADIN? Take Coumadin (warfarin) exactly as prescribed by your healthcare  provider about the same time each day.  DO NOT stop taking without talking to the doctor who prescribed the medication.  Stopping without other blood clot prevention medication to take the place of Coumadin may increase your risk of developing a new clot or stroke.  Get refills before you run out.  What do you do if you miss a dose? If you miss a dose, take it as soon as you remember on the same day then continue your regularly scheduled regimen the next day.  Do not take two doses of Coumadin at the same time.  Important Safety Information A possible side effect of Coumadin (Warfarin) is an increased risk of bleeding. You should call your healthcare provider right away if you experience any of the following: ? Bleeding from an injury or your nose that does not stop. ? Unusual colored urine (red or dark brown) or unusual colored stools (red or black). ? Unusual bruising for unknown reasons. ? A serious fall or if you hit your head (even if there is no bleeding).  Some foods or medicines interact with Coumadin (warfarin) and might alter your response to warfarin. To help avoid this: ? Eat a balanced diet, maintaining a consistent amount of Vitamin K. ? Notify your provider about major diet changes you plan to make. ? Avoid alcohol or limit your intake to 1 drink for women and 2 drinks for men per day. (1 drink is 5 oz. wine, 12 oz. beer, or 1.5 oz. liquor.)  Make sure that ANY health care provider who prescribes medication for you knows that you are taking Coumadin (warfarin).  Also make sure the healthcare provider who is monitoring your Coumadin knows when you have started a new medication including herbals and non-prescription products.  Coumadin (Warfarin)  Major Drug Interactions  Increased Warfarin Effect Decreased Warfarin Effect  Alcohol (large  quantities) Antibiotics (esp. Septra/Bactrim, Flagyl, Cipro) Amiodarone (Cordarone) Aspirin (ASA) Cimetidine (Tagamet) Megestrol  (Megace) NSAIDs (ibuprofen, naproxen, etc.) Piroxicam (Feldene) Propafenone (Rythmol SR) Propranolol (Inderal) Isoniazid (INH) Posaconazole (Noxafil) Barbiturates (Phenobarbital) Carbamazepine (Tegretol) Chlordiazepoxide (Librium) Cholestyramine (Questran) Griseofulvin Oral Contraceptives Rifampin Sucralfate (Carafate) Vitamin K   Coumadin (Warfarin) Major Herbal Interactions  Increased Warfarin Effect Decreased Warfarin Effect  Garlic Ginseng Ginkgo biloba Coenzyme Q10 Green tea St. Johns wort    Coumadin (Warfarin) FOOD Interactions  Eat a consistent number of servings per week of foods HIGH in Vitamin K (1 serving =  cup)  Collards (cooked, or boiled & drained) Kale (cooked, or boiled & drained) Mustard greens (cooked, or boiled & drained) Parsley *serving size only =  cup Spinach (cooked, or boiled & drained) Swiss chard (cooked, or boiled & drained) Turnip greens (cooked, or boiled & drained)  Eat a consistent number of servings per week of foods MEDIUM-HIGH in Vitamin K (1 serving = 1 cup)  Asparagus (cooked, or boiled & drained) Broccoli (cooked, boiled & drained, or raw & chopped) Brussel sprouts (cooked, or boiled & drained) *serving size only =  cup Lettuce, raw (green leaf, endive, romaine) Spinach, raw Turnip greens, raw & chopped   These websites have more information on Coumadin (warfarin):  FailFactory.se; VeganReport.com.au;

## 2017-06-09 LAB — BASIC METABOLIC PANEL
ANION GAP: 11 (ref 5–15)
BUN: <5 mg/dL — ABNORMAL LOW (ref 6–20)
CALCIUM: 8.2 mg/dL — AB (ref 8.9–10.3)
CO2: 27 mmol/L (ref 22–32)
Chloride: 96 mmol/L — ABNORMAL LOW (ref 101–111)
Creatinine, Ser: 0.61 mg/dL (ref 0.44–1.00)
GFR calc non Af Amer: >60 mL/min (ref >60)
GLUCOSE: 88 mg/dL (ref 65–99)
POTASSIUM: 3.2 mmol/L — AB (ref 3.5–5.1)
Sodium: 134 mmol/L — ABNORMAL LOW (ref 135–145)

## 2017-06-09 LAB — AEROBIC/ANAEROBIC CULTURE W GRAM STAIN (SURGICAL/DEEP WOUND): Culture: NO GROWTH

## 2017-06-09 LAB — PROTIME-INR
INR: 1.8
Prothrombin Time: 20.7 seconds — ABNORMAL HIGH (ref 11.4–15.2)

## 2017-06-09 MED ORDER — CEFTRIAXONE IV (FOR PTA / DISCHARGE USE ONLY): 2.0000 g | INTRAVENOUS | 0 refills | Status: DC

## 2017-06-09 MED ORDER — OXYCODONE HCL 5 MG PO TABS: 5.0000 mg | ORAL_TABLET | Freq: Four times a day (QID) | ORAL | 0 refills | Status: DC | PRN

## 2017-06-09 MED ORDER — GENTAMICIN IV (FOR PTA / DISCHARGE USE ONLY): 200.0000 mg | INTRAVENOUS | 0 refills | Status: AC

## 2017-06-09 MED ORDER — HEPARIN SOD (PORK) LOCK FLUSH 100 UNIT/ML IV SOLN
250.0000 [IU] | INTRAVENOUS | Status: DC | PRN
  Administered 2017-06-09: 250 [IU]
  Filled 2017-06-09 (×2): qty 3

## 2017-06-09 MED ORDER — WARFARIN SODIUM 5 MG PO TABS: 5.0000 mg | ORAL_TABLET | Freq: Every day | ORAL | 1 refills | Status: DC

## 2017-06-09 MED ORDER — HEPARIN SOD (PORK) LOCK FLUSH 100 UNIT/ML IV SOLN
250.0000 [IU] | Freq: Every day | INTRAVENOUS | Status: DC
  Filled 2017-06-09: qty 2.5

## 2017-06-09 MED ORDER — METOPROLOL TARTRATE 25 MG PO TABS
25.0000 mg | ORAL_TABLET | Freq: Two times a day (BID) | ORAL | 1 refills | Status: DC
Start: 2017-06-09 — End: 2017-08-13

## 2017-06-09 MED ORDER — SODIUM CHLORIDE 0.9% FLUSH
10.0000 mL | INTRAVENOUS | Status: DC | PRN
  Administered 2017-06-09: 39 mL
  Administered 2017-06-09: 10 mL
  Filled 2017-06-09: qty 40

## 2017-06-09 MED ORDER — POTASSIUM CHLORIDE CRYS ER 20 MEQ PO TBCR
40.0000 meq | EXTENDED_RELEASE_TABLET | Freq: Once | ORAL | Status: AC
  Administered 2017-06-09: 40 meq via ORAL
  Filled 2017-06-09: qty 2

## 2017-06-09 MED ORDER — ASPIRIN 81 MG PO TBEC: 81.0000 mg | DELAYED_RELEASE_TABLET | Freq: Every day | ORAL | Status: AC

## 2017-06-09 NOTE — Progress Notes (Signed)
Patient ambulated in hallway with family /friend. Will monitor patient. Devontre Siedschlag, Bettina Gavia rN

## 2017-06-09 NOTE — Progress Notes (Signed)
Chest tube suture removed and steri strip applied. Will monitor patient. Aalayah Riles, Bettina Gavia RN

## 2017-06-09 NOTE — Progress Notes (Signed)
Patient had received dose of Rocephin prior to discharge. IV team flushed/capped picc for home. Patient given discharge instructions, medication list and paper prescriptions given. Patient verbalized understanding of follow up appointments. Patient transported to exit via wheel chair with nursing staff. Mallori Araque, Bettina Gavia rN

## 2017-06-09 NOTE — Care Management (Signed)
Pt to be discharged today.  Pt had Gentamycin at 4am and scheduled Rocephin for 8 pm.  Per Erasmo Downer, RN, pharmacy states Rocephin can be given around 2pm.  Spoke with pt and agrees to d/c around 3pm today.  Jermaine with [facility] advised.  Medication will be delivered to pt's home this afternoon and RN will visit in the morning.  Education regarding abx administration will be done during that visit in home.  Pt verbalizes understanding of plan.

## 2017-06-09 NOTE — Progress Notes (Addendum)
[address]       [place] [address]             [phone]      5 Days Post-Op Procedure(s) (LRB): AORTIC VALVE REPLACEMENT (AVR) (N/A) TRANSESOPHAGEAL ECHOCARDIOGRAM (TEE) (N/A) Subjective: Feels well, in sinus rhythm, INR 1.8  Objective: Vital signs in last 24 hours: Temp:  [97.7 F (36.5 C)-98.8 F (37.1 C)] 97.7 F (36.5 C) (12/29 0406) Pulse Rate:  [97-113] 98 (12/29 0406) Cardiac Rhythm: Normal sinus rhythm (12/29 0700) Resp:  [15-23] 23 (12/29 0406) BP: (113-129)/(84-106) 114/87 (12/29 0406) SpO2:  [92 %-97 %] 97 % (12/29 0406) Weight:  [142 lb 3.2 oz (64.5 kg)] 142 lb 3.2 oz (64.5 kg) (12/29 0406)  Hemodynamic parameters for last 24 hours:    Intake/Output from previous day: No intake/output data recorded. Intake/Output this shift: No intake/output data recorded.  General appearance: alert, cooperative and no distress Heart: regular rate and rhythm and 2/6 systolic murmur Lungs: clear to auscultation bilaterally Abdomen: benign Extremities: no edema Wound: incis healing well  Lab Results: Recent Labs    06/07/17 0458  WBC 10.8*  HGB 8.3*  HCT 26.7*  PLT 203   BMET:  Recent Labs    06/07/17 0458 06/09/17 0500  NA 134* 134*  K 3.2* 3.2*  CL 99* 96*  CO2 26 27  GLUCOSE 117* 88  BUN 7 <5*  CREATININE 0.62 0.61  CALCIUM 7.4* 8.2*    PT/INR:  Recent Labs    06/09/17 0500  LABPROT 20.7*  INR 1.80   ABG    Component Value Date/Time   PHART 7.325 (L) 06/04/2017 1658   HCO3 22.5 06/04/2017 1658   TCO2 26 06/05/2017 1942   ACIDBASEDEF 3.0 (H) 06/04/2017 1658   O2SAT 68.2 06/06/2017 0429   CBG (last 3)  No results for input(s): GLUCAP in the last 72 hours.  Meds Scheduled Meds: . acetaminophen  1,000 mg Oral Q6H   Or  . acetaminophen (TYLENOL) oral liquid 160 mg/5 mL  1,000 mg Per Tube Q6H  . aspirin EC  81 mg Oral Daily   Or  . aspirin  81 mg Per Tube Daily  . Chlorhexidine Gluconate Cloth  6 each  Topical Daily  . enoxaparin (LOVENOX) injection  40 mg Subcutaneous Daily  . fentaNYL  75 mcg Transdermal Q72H  . furosemide  40 mg Oral Daily  . metoprolol tartrate  25 mg Oral BID  . pantoprazole  40 mg Oral Daily  . potassium chloride  20 mEq Oral Daily  . saccharomyces boulardii  250 mg Oral BID  . sodium chloride flush  10-40 mL Intracatheter Q12H  . sodium chloride flush  3 mL Intravenous Q12H  . sucralfate  1 g Oral TID PC & HS  . warfarin  4 mg Oral q1800  . warfarin   Does not apply Once  . Warfarin - Physician Dosing Inpatient   Does not apply q1800   Continuous Infusions: . sodium chloride    . sodium chloride    . cefTRIAXone (ROCEPHIN)  IV Stopped (06/08/17 2150)  . gentamicin Stopped (06/09/17 0543)   PRN Meds:.sodium chloride, loperamide, metoprolol tartrate, ondansetron (ZOFRAN) IV, oxyCODONE, sodium chloride flush, sodium chloride flush, traMADol, traZODone  Xrays Dg Chest 2 View  Result Date: 06/08/2017 CLINICAL DATA:  Status post aortic valve replacement EXAM: CHEST  2 VIEW COMPARISON:  June 07, 2017 FINDINGS: Central catheter tip is in the superior vena cava. No  pneumothorax. There is slight left base atelectasis. Lungs elsewhere clear. Heart is mildly enlarged with pulmonary vascularity within normal limits. There is an aortic valve replacement. Mild separation of the upper manubrium is noted with wire in place in this area. IMPRESSION: Slight left base atelectasis. No edema or consolidation. Stable cardiac prominence. Central catheter tip in superior vena cava. No pneumothorax. Mild widening in the upper manubrium at the postoperative site noted. A sternal wire transfixes this area. Electronically Signed   By: Lowella Grip III M.D.   On: 06/08/2017 07:34    Assessment/Plan: S/P Procedure(s) (LRB): AORTIC VALVE REPLACEMENT (AVR) (N/A) TRANSESOPHAGEAL ECHOCARDIOGRAM (TEE) (N/A)  1 doing well 2 INR ok for d/c 3 abx arranged 4 give additional K+ today  prior to discharge 5 discharge home  LOS: 14 days    John Giovanni 06/09/2017  Discharge instructions reviewed with patient Looks good CXR personally reviewed- clear patient examined and medical record reviewed,agree with above note. Tharon Aquas Trigt III 06/09/2017

## 2017-06-11 ENCOUNTER — Ambulatory Visit (INDEPENDENT_AMBULATORY_CARE_PROVIDER_SITE_OTHER): Payer: 59 | Admitting: *Deleted

## 2017-06-11 DIAGNOSIS — Z952 Presence of prosthetic heart valve: Secondary | ICD-10-CM | POA: Diagnosis not present

## 2017-06-11 DIAGNOSIS — Z5181 Encounter for therapeutic drug level monitoring: Secondary | ICD-10-CM | POA: Diagnosis not present

## 2017-06-11 LAB — POCT INR: INR: 3.2

## 2017-06-11 NOTE — Patient Instructions (Addendum)
A full discussion of the nature of anticoagulants has been carried out.  A benefit risk analysis has been presented to the patient, so that they understand the justification for choosing anticoagulation at this time. The need for frequent and regular monitoring, precise dosage adjustment and compliance is stressed.  Side effects of potential bleeding are discussed.  The patient should avoid any OTC items containing aspirin or ibuprofen, and should avoid great swings in general diet.  Avoid alcohol consumption.  Call if any signs of abnormal bleeding.   Description   Do not take coumadin today Dec 31st then change coumadin dose to 1/2 tablet(2.5mg ) daily Recheck INR in 1 week will call [facility] and have them check INR and call results to [place] please call with any concerns new medications or if scheduled for any procedures [phone]

## 2017-06-14 ENCOUNTER — Telehealth (HOSPITAL_COMMUNITY): Payer: Self-pay

## 2017-06-14 NOTE — Telephone Encounter (Signed)
Patients insurance is active and benefits verified through [employer] - No co-pay, deductible amount of $3,300/$0.00 has been met, out of pocket amount of $6,600/$0.00 has been met, 20% co-insurance, and no pre-authorization is required. Passport/reference #[id number] ° °Patient will be contacted and scheduled after their follow up appt with the Cardiologist office upon review by the RN Navigator. °

## 2017-06-15 ENCOUNTER — Other Ambulatory Visit: Payer: Self-pay | Admitting: Pharmacist

## 2017-06-15 ENCOUNTER — Other Ambulatory Visit: Payer: Self-pay

## 2017-06-15 DIAGNOSIS — G8918 Other acute postprocedural pain: Secondary | ICD-10-CM

## 2017-06-15 MED ORDER — TRAMADOL HCL 50 MG PO TABS: 50.0000 mg | ORAL_TABLET | Freq: Four times a day (QID) | ORAL | 0 refills | Status: DC | PRN

## 2017-06-18 ENCOUNTER — Encounter: Payer: Self-pay | Admitting: Infectious Disease

## 2017-06-18 ENCOUNTER — Ambulatory Visit (INDEPENDENT_AMBULATORY_CARE_PROVIDER_SITE_OTHER): Payer: 59 | Admitting: Cardiology

## 2017-06-18 DIAGNOSIS — Z5181 Encounter for therapeutic drug level monitoring: Secondary | ICD-10-CM

## 2017-06-18 DIAGNOSIS — Z952 Presence of prosthetic heart valve: Secondary | ICD-10-CM

## 2017-06-18 LAB — POCT INR: INR: 1.4

## 2017-06-19 NOTE — Progress Notes (Deleted)
Cardiology Office Note   Date:  06/19/2017   ID:  Krystal Shaffer, DOB 04-28-1976, MRN [id number]  PCP:  Sofie Rower, PA-C  Cardiologist:  Peter Martinique, MD No chief complaint on file.    History of Present Illness: Krystal Shaffer is a 42 y.o. female who presents for ongoing assessment and management of aortic valve repair, hx of Aortic Valve endocarditis, with other hx of ABL anemia and chronic dental caries. She was recently discharged from [facility] after admission for bacterial endocarditis and was found to have aortic vegetation.    Blood cultures grew viridian strep, she was treated for bacteremia.  An infective endocarditis.  She was seen by cardiothoracic surgery who recommended aortic valve surgery with inpatient cardiac and dental clearance including dental extraction prior to surgery.  The patient was seen by dentist Dr. Gibson Ramp prior to surgical intervention of aortic valve.  Valve replacement was completed on 06/04/2017.  The patient tolerated the procedure well, and has been at home recovering.   Past Medical History:  Diagnosis Date  . Cystitis 05/2017  . Family history of adverse reaction to anesthesia    " mother has a hard time waking "  . Migraine   . Migraines     Past Surgical History:  Procedure Laterality Date  . AORTIC VALVE REPLACEMENT N/A 06/04/2017   Procedure: AORTIC VALVE REPLACEMENT (AVR);  Surgeon: Prescott Gum, Collier Salina, MD;  Location: [place];  Service: Open Heart Surgery;  Laterality: N/A;  . MULTIPLE EXTRACTIONS WITH ALVEOLOPLASTY N/A 05/31/2017   Procedure: Extraction of tooth  #'s [phone] with alveoloplasty and gross debridement of remaining dentition;  Surgeon: Lenn Cal, DDS;  Location: [place];  Service: Oral Surgery;  Laterality: N/A;  . TEE WITHOUT CARDIOVERSION N/A 05/29/2017   Procedure: TRANSESOPHAGEAL ECHOCARDIOGRAM (TEE);  Surgeon: Jerline Pain, MD;  Location: [facility] ENDOSCOPY;  Service: Cardiovascular;  Laterality: N/A;  . TEE WITHOUT  CARDIOVERSION N/A 06/04/2017   Procedure: TRANSESOPHAGEAL ECHOCARDIOGRAM (TEE);  Surgeon: Prescott Gum, Collier Salina, MD;  Location: [place];  Service: Open Heart Surgery;  Laterality: N/A;  . WISDOM TOOTH EXTRACTION       Current Outpatient Medications  Medication Sig Dispense Refill  . aspirin EC 81 MG EC tablet Take 1 tablet (81 mg total) by mouth daily.    . cefTRIAXone (ROCEPHIN) IVPB Inject 2 g into the vein daily. Indication: endocarditis Last Day of Therapy:  07/01/17 Labs - Once weekly:  CBC/D and BMP, Labs - Every other week:  ESR and CRP 24 Units 0  . gentamicin (GARAMYCIN) IVPB Inject 200 mg into the vein daily for 10 days. Indication:  endocarditis Last Day of Therapy:  06/17/17 Labs - Sunday/Monday:  CBC/D, BMP, and gentamicin trough. Labs - Thursday:  BMP and gentamicin trough Labs - Every other week:  ESR and CRP 10 Units 0  . metoprolol tartrate (LOPRESSOR) 25 MG tablet Take 1 tablet (25 mg total) by mouth 2 (two) times daily. 60 tablet 1  . oxyCODONE (OXY IR/ROXICODONE) 5 MG immediate release tablet Take 1 tablet (5 mg total) by mouth every 6 (six) hours as needed for severe pain. 30 tablet 0  . traMADol (ULTRAM) 50 MG tablet Take 1 tablet (50 mg total) by mouth every 6 (six) hours as needed. 40 tablet 0  . warfarin (COUMADIN) 5 MG tablet Take 1 tablet (5 mg total) by mouth daily at 6 PM. As directed by the coumadin clinic 100 tablet 1   No current facility-administered medications for this visit.  Allergies:   Patient has no known allergies.    Social History:  The patient  reports that  has never smoked. she has never used smokeless tobacco. She reports that she does not drink alcohol or use drugs.   Family History:  The patient's family history includes Hypertension in her sister; Obesity in her mother.    ROS: All other systems are reviewed and negative. Unless otherwise mentioned in H&P    PHYSICAL EXAM: VS:  LMP June 11, 2017  , BMI There is no height or weight on file  to calculate BMI. GEN: Well nourished, well developed, in no acute distress  HEENT: normal  Neck: no JVD, carotid bruits, or masses Cardiac: ***RRR; no murmurs, rubs, or gallops,no edema  Respiratory:  clear to auscultation bilaterally, normal work of breathing GI: soft, nontender, nondistended, + BS MS: no deformity or atrophy  Skin: warm and dry, no rash Neuro:  Strength and sensation are intact Psych: euthymic mood, full affect   EKG:  EKG {ACTION; IS/IS [id number]} ordered today. The ekg ordered today demonstrates ***   Recent Labs: 05/24/2017: ALT 10 06/11/2017: TSH 3.337 06/05/2017: Magnesium 1.8 06/07/2017: Hemoglobin 8.3; Platelets 203 06/09/2017: BUN <5; Creatinine, Ser 0.61; Potassium 3.2; Sodium 134    Lipid Panel No results found for: CHOL, TRIG, HDL, CHOLHDL, VLDL, LDLCALC, LDLDIRECT    Wt Readings from Last 3 Encounters:  06/09/17 142 lb 3.2 oz (64.5 kg)  05/19/17 144 lb 2.9 oz (65.4 kg)  05/16/17 152 lb (68.9 kg)      Other studies Reviewed: Echocardiogram 2017-06-11 Left ventricle: The cavity size was normal. Systolic function was normal. The estimated ejection fraction was in the range of 60% to 65%. Wall motion was normal; there were no regional wall motion abnormalities. Doppler parameters are consistent with abnormal left ventricular relaxation (grade 1 diastolic dysfunction). - Aortic valve: Trileaflet; moderately thickened leaflets with 1cm x 1cm mass on the right coronary cusp, mild mobility, suggestive of vegetation. If no signs of infection, consider fibroelastoma. There was severe stenosis. Valve area (VTI): 1.32 cm^2. Valve area (Vmax): 1.41 cm^2. Valve area (Vmean): 1.28 cm^2.     ASSESSMENT AND PLAN:  1.  ***   Current medicines are reviewed at length with the patient today.    Labs/ tests ordered today include: *** Phill Myron. West Pugh, ANP, AACC   06/19/2017 1:11 PM    [facility]  [facility] [address], [place], [place] [address] Phone: [phone]; Fax: [phone]

## 2017-06-20 ENCOUNTER — Ambulatory Visit: Payer: 59 | Admitting: Adult Health

## 2017-06-21 ENCOUNTER — Other Ambulatory Visit: Payer: Self-pay | Admitting: Pharmacist

## 2017-06-25 ENCOUNTER — Ambulatory Visit (HOSPITAL_COMMUNITY): Payer: 59 | Admitting: Dentistry

## 2017-06-25 ENCOUNTER — Telehealth: Payer: Self-pay | Admitting: Behavioral Health

## 2017-06-25 ENCOUNTER — Encounter: Payer: Self-pay | Admitting: Infectious Disease

## 2017-06-25 ENCOUNTER — Other Ambulatory Visit: Payer: Self-pay

## 2017-06-25 ENCOUNTER — Ambulatory Visit (INDEPENDENT_AMBULATORY_CARE_PROVIDER_SITE_OTHER): Payer: 59 | Admitting: Infectious Disease

## 2017-06-25 ENCOUNTER — Ambulatory Visit (INDEPENDENT_AMBULATORY_CARE_PROVIDER_SITE_OTHER): Payer: 59 | Admitting: Cardiovascular Disease

## 2017-06-25 ENCOUNTER — Telehealth: Payer: Self-pay

## 2017-06-25 VITALS — BP 127/83 | HR 75 | Temp 98.1°F | Ht 65.0 in | Wt 142.0 lb

## 2017-06-25 DIAGNOSIS — R51 Headache: Secondary | ICD-10-CM

## 2017-06-25 DIAGNOSIS — Z952 Presence of prosthetic heart valve: Secondary | ICD-10-CM | POA: Diagnosis not present

## 2017-06-25 DIAGNOSIS — A409 Streptococcal sepsis, unspecified: Secondary | ICD-10-CM | POA: Diagnosis not present

## 2017-06-25 DIAGNOSIS — I358 Other nonrheumatic aortic valve disorders: Secondary | ICD-10-CM

## 2017-06-25 DIAGNOSIS — Z5181 Encounter for therapeutic drug level monitoring: Secondary | ICD-10-CM | POA: Diagnosis not present

## 2017-06-25 DIAGNOSIS — Z951 Presence of aortocoronary bypass graft: Secondary | ICD-10-CM

## 2017-06-25 DIAGNOSIS — R7881 Bacteremia: Secondary | ICD-10-CM | POA: Diagnosis not present

## 2017-06-25 DIAGNOSIS — R519 Headache, unspecified: Secondary | ICD-10-CM

## 2017-06-25 DIAGNOSIS — B955 Unspecified streptococcus as the cause of diseases classified elsewhere: Secondary | ICD-10-CM | POA: Diagnosis not present

## 2017-06-25 LAB — POCT INR: INR: 2.3

## 2017-06-25 NOTE — Patient Instructions (Signed)
PICC can be removed after you finish your IV antibiotics  Come back to our clinic for a lab draw for surveillance blood cultures on 07/17/17 or later

## 2017-06-25 NOTE — Progress Notes (Deleted)
Cardiology Office Note   Date:  06/25/2017   ID:  Krystal Shaffer, DOB July 13, 1975, MRN [id number]  PCP:  Sofie Rower, PA-C  Cardiologist:   Dr. Martinique  No chief complaint on file.    History of Present Illness: Krystal Shaffer is a 42 y.o. female who presents for posthospitalization follow-up after admission for aortic valve surgery after streptococcal bacteremia endocarditis.  She was seen in consultation on 05/28/2017 prior to surgery, by Dr. Martinique.  The patient was scheduled for cardiac CT and a TEE.  Patient was also found to have chronic periodontal disease.  TEE revealed aortic valve is moderately thickened with 1 cm x 1 cm mass on the right coronary cusp, mild mobility, suggestive of vegetation.  The patient was treated with antibiotic therapy.  Due to periodontal disease, she was seen by Dr. Dorothyann Gibbs, and had several dental extractions on 05/31/2017.Marland Kitchen  Coronary CT was done on 05/29/2017 which was negative for coronary artery disease.  She remained on antibiotic therapy until blood cultures were negative and showing no growth.  The patient subsequently had aortic valve replacement on 06/04/2017 by Dr. Nils Pyle post surgery, the patient was found to have some fluid overload and was placed on milrinone drip temporarily.  She was placed on Coumadin therapy post surgery due to mechanical aortic valve.(21 mm [employer] valve).    Past Medical History:  Diagnosis Date  . Cystitis 05/2017  . Family history of adverse reaction to anesthesia    " mother has a hard time waking "  . Migraine   . Migraines     Past Surgical History:  Procedure Laterality Date  . AORTIC VALVE REPLACEMENT N/A 06/04/2017   Procedure: AORTIC VALVE REPLACEMENT (AVR);  Surgeon: Prescott Gum, Collier Salina, MD;  Location: [place];  Service: Open Heart Surgery;  Laterality: N/A;  . MULTIPLE EXTRACTIONS WITH ALVEOLOPLASTY N/A 05/31/2017   Procedure: Extraction of tooth  #'s [phone] with alveoloplasty and gross  debridement of remaining dentition;  Surgeon: Lenn Cal, DDS;  Location: [place];  Service: Oral Surgery;  Laterality: N/A;  . TEE WITHOUT CARDIOVERSION N/A 05/29/2017   Procedure: TRANSESOPHAGEAL ECHOCARDIOGRAM (TEE);  Surgeon: Jerline Pain, MD;  Location: [facility] ENDOSCOPY;  Service: Cardiovascular;  Laterality: N/A;  . TEE WITHOUT CARDIOVERSION N/A 06/04/2017   Procedure: TRANSESOPHAGEAL ECHOCARDIOGRAM (TEE);  Surgeon: Prescott Gum, Collier Salina, MD;  Location: [place];  Service: Open Heart Surgery;  Laterality: N/A;  . WISDOM TOOTH EXTRACTION       Current Outpatient Medications  Medication Sig Dispense Refill  . aspirin EC 81 MG EC tablet Take 1 tablet (81 mg total) by mouth daily.    . cefTRIAXone (ROCEPHIN) IVPB Inject 2 g into the vein daily. Indication: endocarditis Last Day of Therapy:  07/01/17 Labs - Once weekly:  CBC/D and BMP, Labs - Every other week:  ESR and CRP 24 Units 0  . metoprolol tartrate (LOPRESSOR) 25 MG tablet Take 1 tablet (25 mg total) by mouth 2 (two) times daily. 60 tablet 1  . oxyCODONE (OXY IR/ROXICODONE) 5 MG immediate release tablet Take 1 tablet (5 mg total) by mouth every 6 (six) hours as needed for severe pain. (Patient not taking: Reported on 06/25/2017) 30 tablet 0  . traMADol (ULTRAM) 50 MG tablet Take 1 tablet (50 mg total) by mouth every 6 (six) hours as needed. 40 tablet 0  . warfarin (COUMADIN) 5 MG tablet Take 1 tablet (5 mg total) by mouth daily at 6 PM. As directed by the coumadin  clinic 100 tablet 1   No current facility-administered medications for this visit.     Allergies:   Patient has no known allergies.    Social History:  The patient  reports that  has never smoked. she has never used smokeless tobacco. She reports that she does not drink alcohol or use drugs.   Family History:  The patient's family history includes Hypertension in her sister; Obesity in her mother.    ROS: All other systems are reviewed and negative. Unless otherwise  mentioned in H&P    PHYSICAL EXAM: VS:  LMP 05/31/2017  , BMI There is no height or weight on file to calculate BMI. GEN: Well nourished, well developed, in no acute distress  HEENT: normal  Neck: no JVD, carotid bruits, or masses Cardiac: ***RRR; no murmurs, rubs, or gallops,no edema  Respiratory:  clear to auscultation bilaterally, normal work of breathing GI: soft, nontender, nondistended, + BS MS: no deformity or atrophy  Skin: warm and dry, no rash Neuro:  Strength and sensation are intact Psych: euthymic mood, full affect   EKG:  EKG {ACTION; IS/IS [id number]} ordered today. The ekg ordered today demonstrates ***   Recent Labs: 05/24/2017: ALT 10 05/25/2017: TSH 3.337 06/05/2017: Magnesium 1.8 06/07/2017: Hemoglobin 8.3; Platelets 203 06/09/2017: BUN <5; Creatinine, Ser 0.61; Potassium 3.2; Sodium 134    Lipid Panel No results found for: CHOL, TRIG, HDL, CHOLHDL, VLDL, LDLCALC, LDLDIRECT    Wt Readings from Last 3 Encounters:  06/25/17 142 lb (64.4 kg)  06/09/17 142 lb 3.2 oz (64.5 kg)  05/19/17 144 lb 2.9 oz (65.4 kg)      Other studies Reviewed: Additional studies/ records that were reviewed today include: ***. Review of the above records demonstrates: ***   ASSESSMENT AND PLAN:  1.  ***   Current medicines are reviewed at length with the patient today.    Labs/ tests ordered today include: *** Phill Myron. West Pugh, ANP, AACC   06/25/2017 5:52 PM    [facility] [address] [place], [place] [address] Phone: [phone]; Fax: [phone]

## 2017-06-25 NOTE — Progress Notes (Signed)
Subjective:   Cc: migrainous headaches anad postop sternal pain   Patient ID: Krystal Shaffer, female    DOB: [birth year], 42 y.o.   MRN: [id number]  HPI  42 y.o. 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 She has completed 2 weeks of gentamicin with ceftriaxone and due to finish ceftriaxone on January 20th.  She has further dental work to be done by Dr. Becky Sax whom she is seeing tomorrow.  She has worsened migraine HA but this has been a chronic problem for her. She has residual sternal pain.  Past Medical History:  Diagnosis Date  . Cystitis 05/2017  . Family history of adverse reaction to anesthesia    " mother has a hard time waking "  . Migraine   . Migraines     Past Surgical History:  Procedure Laterality Date  . AORTIC VALVE REPLACEMENT N/A 06/04/2017   Procedure: AORTIC VALVE REPLACEMENT (AVR);  Surgeon: Prescott Gum, Collier Salina, MD;  Location: [place];  Service: Open Heart Surgery;  Laterality: N/A;  . MULTIPLE EXTRACTIONS WITH ALVEOLOPLASTY N/A 05/31/2017   Procedure: Extraction of tooth  #'s [phone] with alveoloplasty and gross debridement of remaining dentition;  Surgeon: Lenn Cal, DDS;  Location: [place];  Service: Oral Surgery;  Laterality: N/A;  . TEE WITHOUT CARDIOVERSION N/A 05/29/2017   Procedure: TRANSESOPHAGEAL ECHOCARDIOGRAM (TEE);  Surgeon: Jerline Pain, MD;  Location[facility] ENDOSCOPY;  Service: Cardiovascular;  Laterality: N/A;  . TEE WITHOUT CARDIOVERSION N/A 06/04/2017   Procedure: TRANSESOPHAGEAL ECHOCARDIOGRAM (TEE);  Surgeon: Prescott Gum, Collier Salina, MD;  Location: [place];  Service: Open Heart Surgery;  Laterality: N/A;  . WISDOM TOOTH EXTRACTION      Family History  Problem Relation Age of Onset  . Obesity Mother   . Hypertension Sister       Social History   Socioeconomic History  . Marital status: Legally Separated    Spouse name: None  . Number of children: None  . Years of education: None  .  Highest education level: None  Social Needs  . Financial resource strain: None  . Food insecurity - worry: None  . Food insecurity - inability: None  . Transportation needs - medical: None  . Transportation needs - non-medical: None  Occupational History  . None  Tobacco Use  . Smoking status: Never Smoker  . Smokeless tobacco: Never Used  Substance and Sexual Activity  . Alcohol use: No  . Drug use: No  . Sexual activity: Yes    Birth control/protection: None  Other Topics Concern  . None  Social History Narrative  . None    No Known Allergies   Current Outpatient Medications:  .  aspirin EC 81 MG EC tablet, Take 1 tablet (81 mg total) by mouth daily., Disp: , Rfl:  .  cefTRIAXone (ROCEPHIN) IVPB, Inject 2 g into the vein daily. Indication: endocarditis Last Day of Therapy:  07/01/17 Labs - Once weekly:  CBC/D and BMP, Labs - Every other week:  ESR and CRP, Disp: 24 Units, Rfl: 0 .  metoprolol tartrate (LOPRESSOR) 25 MG tablet, Take 1 tablet (25 mg total) by mouth 2 (two) times daily., Disp: 60 tablet, Rfl: 1 .  traMADol (ULTRAM) 50 MG tablet, Take 1 tablet (50 mg total) by mouth every 6 (six) hours as needed., Disp: 40 tablet, Rfl: 0 .  warfarin (COUMADIN) 5 MG tablet, Take 1 tablet (5 mg total) by mouth daily at 6 PM. As directed by the  coumadin clinic, Disp: 100 tablet, Rfl: 1 .  oxyCODONE (OXY IR/ROXICODONE) 5 MG immediate release tablet, Take 1 tablet (5 mg total) by mouth every 6 (six) hours as needed for severe pain. (Patient not taking: Reported on 06/25/2017), Disp: 30 tablet, Rfl: 0      Review of Systems  Constitutional: Negative for chills and fever.  HENT: Negative for congestion and sore throat.   Eyes: Negative for photophobia.  Respiratory: Negative for cough, shortness of breath and wheezing.   Cardiovascular: Positive for chest pain. Negative for palpitations and leg swelling.  Gastrointestinal: Negative for abdominal pain, blood in stool, constipation,  diarrhea, nausea and vomiting.  Genitourinary: Negative for dysuria, flank pain and hematuria.  Musculoskeletal: Negative for back pain and myalgias.  Skin: Positive for wound. Negative for rash.  Neurological: Positive for headaches. Negative for dizziness and weakness.  Hematological: Does not bruise/bleed easily.  Psychiatric/Behavioral: Negative for suicidal ideas.       Objective:   Physical Exam  Constitutional: She is oriented to person, place, and time. She appears well-developed and well-nourished. No distress.  HENT:  Head: Normocephalic and atraumatic.  Mouth/Throat: No oropharyngeal exudate.  Eyes: Conjunctivae and EOM are normal. No scleral icterus.  Neck: Normal range of motion. Neck supple.  Cardiovascular: Normal rate and regular rhythm.  Murmur heard. Pulmonary/Chest: Effort normal. No respiratory distress. She has no wheezes.    Abdominal: She exhibits no distension.  Musculoskeletal: She exhibits no edema or tenderness.  Neurological: She is alert and oriented to person, place, and time. She exhibits normal muscle tone. Coordination normal.  Skin: Skin is warm and dry. No rash noted. She is not diaphoretic. No erythema. No pallor.  Psychiatric: She has a normal mood and affect. Her behavior is normal. Judgment and thought content normal.  Nursing note and vitals reviewed.         Assessment & Plan:   AV endocarditis sp AVR: she will complete her therapy and we will check blood cultures 2 weeks + post IV abx stop date  Migraines: defer to primary  Dental : critical that she has very close dental followup and hygeine of skin as well to protect her valve

## 2017-06-25 NOTE — Telephone Encounter (Signed)
[place] and spoke with Herminio Commons RN order given to pull PICC line when IV therapy is complete Jan, 20th, 2019 per Dr. Arman Filter verbalized understanding with read-back.  Pricilla Riffle RN

## 2017-06-25 NOTE — Patient Instructions (Signed)
Description   Spoke with Eustaquio Maize, [facility] nurse and advised to continue  taking 1/2 tablet(2.5mg ) daily, except 1 tablet on Monday, Wednesday and Friday - Beth will contact patient with instructions. Recheck INR in 1 week.

## 2017-06-25 NOTE — Telephone Encounter (Signed)
Beth from [place] called in regards to Krystal Shaffer complaints of sternal "popping and cracking" with pain at the sternal site.  Patient called for further details, she then described it as popping and cracking when she stood up or reached for something, "like when your back pops".  Patient asked to come into the office, Friday, January 18, with a chest xray before to be evaluated for the popping and cracking.  Patient acknowledged receipt.  All questions were addressed.

## 2017-06-26 ENCOUNTER — Ambulatory Visit (HOSPITAL_COMMUNITY): Payer: 59 | Admitting: Dentistry

## 2017-06-27 ENCOUNTER — Ambulatory Visit: Payer: 59 | Admitting: Adult Health

## 2017-06-28 ENCOUNTER — Encounter: Payer: Self-pay | Admitting: Adult Health

## 2017-06-28 ENCOUNTER — Ambulatory Visit (INDEPENDENT_AMBULATORY_CARE_PROVIDER_SITE_OTHER): Payer: 59 | Admitting: Adult Health

## 2017-06-28 VITALS — BP 128/76 | HR 85 | Ht 64.0 in | Wt 150.4 lb

## 2017-06-28 DIAGNOSIS — Q231 Congenital insufficiency of aortic valve: Secondary | ICD-10-CM | POA: Diagnosis not present

## 2017-06-28 DIAGNOSIS — Q230 Congenital stenosis of aortic valve: Secondary | ICD-10-CM

## 2017-06-28 DIAGNOSIS — I39 Endocarditis and heart valve disorders in diseases classified elsewhere: Secondary | ICD-10-CM

## 2017-06-28 NOTE — Progress Notes (Signed)
Cardiology Office Note   Date:  06/28/2017   ID:  Miranda Frese, DOB [birth year], MRN [id number]  PCP:  Sofie Rower, PA-C  Cardiologist:  Martinique, Peter MD  Chief Complaint  Patient presents with  . Follow-up    pt states having pain where the incision is at  . Shortness of Breath    states only when doing to much of work   . Hospitalization Follow-up     History of Present Illness: Krystal Shaffer is a 42 y.o. female who presents for post hospitalization after AoV replacement due to bacterial endocarditis, and severe aortic insufficiency of a bicuspid AoV .  CVTS recommended aortic valve surgerywith inpatient cardiac and dental clearanceincluding dental extraction before surgery scheduling. This was completed and she underwent dental extractions. Panorex xrayswere completed due to dental disease which showed tooth abscesses. Dr. Enrique Sack consulted.  She has been sore, but has not had significant pain or dyspnea. She is feeling better and stronger. She has a HHN see her post hospitalization.  Her main complaint has been insomnia. This is not new for her. She had been taking on benadryl in the past which was helpful. She questions whether she can start taking this again.   Past Medical History:  Diagnosis Date  . Cystitis 05/2017  . Family history of adverse reaction to anesthesia    " mother has a hard time waking "  . Migraine   . Migraines     Past Surgical History:  Procedure Laterality Date  . AORTIC VALVE REPLACEMENT N/A 06/04/2017   Procedure: AORTIC VALVE REPLACEMENT (AVR);  Surgeon: Prescott Gum, Collier Salina, MD;  Location: [place];  Service: Open Heart Surgery;  Laterality: N/A;  . MULTIPLE EXTRACTIONS WITH ALVEOLOPLASTY N/A 05/31/2017   Procedure: Extraction of tooth  #'s [phone] with alveoloplasty and gross debridement of remaining dentition;  Surgeon: Lenn Cal, DDS;  Location: [place];  Service: Oral Surgery;  Laterality: N/A;  . TEE WITHOUT CARDIOVERSION  N/A 05/29/2017   Procedure: TRANSESOPHAGEAL ECHOCARDIOGRAM (TEE);  Surgeon: Jerline Pain, MD;  Location: [facility] ENDOSCOPY;  Service: Cardiovascular;  Laterality: N/A;  . TEE WITHOUT CARDIOVERSION N/A 06/04/2017   Procedure: TRANSESOPHAGEAL ECHOCARDIOGRAM (TEE);  Surgeon: Prescott Gum, Collier Salina, MD;  Location: [place];  Service: Open Heart Surgery;  Laterality: N/A;  . WISDOM TOOTH EXTRACTION       Current Outpatient Medications  Medication Sig Dispense Refill  . aspirin EC 81 MG EC tablet Take 1 tablet (81 mg total) by mouth daily.    . cefTRIAXone (ROCEPHIN) IVPB Inject 2 g into the vein daily. Indication: endocarditis Last Day of Therapy:  07/01/17 Labs - Once weekly:  CBC/D and BMP, Labs - Every other week:  ESR and CRP 24 Units 0  . metoprolol tartrate (LOPRESSOR) 25 MG tablet Take 1 tablet (25 mg total) by mouth 2 (two) times daily. 60 tablet 1  . warfarin (COUMADIN) 5 MG tablet Take 1 tablet (5 mg total) by mouth daily at 6 PM. As directed by the coumadin clinic 100 tablet 1   No current facility-administered medications for this visit.     Allergies:   Patient has no known allergies.    Social History:  The patient  reports that  has never smoked. she has never used smokeless tobacco. She reports that she does not drink alcohol or use drugs.   Family History:  The patient's family history includes Hypertension in her sister; Obesity in her mother.    ROS: All other systems  are reviewed and negative. Unless otherwise mentioned in H&P    PHYSICAL EXAM: VS:  BP 128/76   Pulse 85   Ht _0  (1.626 m)   Wt 150 lb 6.4 oz (68.2 kg)   LMP 05/31/2017   BMI 25.82 kg/m  , BMI Body mass index is 25.82 kg/m. GEN: Well nourished, well developed, in no acute distress  HEENT: normal  Neck: no JVD, carotid bruits, or masses Cardiac: RRR 2/6 systolic murmur with crisp click, rubs, or gallops,no edema  Respiratory:  clear to auscultation bilaterally, normal work of breathing GI: soft,  nontender, nondistended, + BS MS: no deformity or atrophy  Skin: warm and dry, no rash Neuro:  Strength and sensation are intact Psych: euthymic mood, full affect   EKG: NSR with sinus arrhythmia, anterior Q-waves.  Recent Labs: 05/24/2017: ALT 10 06/18/2017: TSH 3.337 06/05/2017: Magnesium 1.8 06/07/2017: Hemoglobin 8.3; Platelets 203 06/09/2017: BUN <5; Creatinine, Ser 0.61; Potassium 3.2; Sodium 134    Lipid Panel No results found for: CHOL, TRIG, HDL, CHOLHDL, VLDL, LDLCALC, LDLDIRECT    Wt Readings from Last 3 Encounters:  06/28/17 150 lb 6.4 oz (68.2 kg)  06/25/17 142 lb (64.4 kg)  06/09/17 142 lb 3.2 oz (64.5 kg)      Other studies Reviewed: Echocardiogram 06-18-17 Study Conclusions  - Left ventricle: The cavity size was normal. Systolic function was normal. The estimated ejection fraction was in the range of 60% to 65%. Wall motion was normal; there were no regional wall motion abnormalities. Doppler parameters are consistent with abnormal left ventricular relaxation (grade 1 diastolic dysfunction). - Aortic valve: Trileaflet; moderately thickened leaflets with 1cm x 1cm mass on the right coronary cusp, mild mobility, suggestive of vegetation. If no signs of infection, consider fibroelastoma. There was severe stenosis. Valve area (VTI): 1.32 cm^2. Valve area (Vmax): 1.41 cm^2. Valve area (Vmean): 1.28 cm^2.  ------------------------------------------------------------------- Study data: No prior study was available for comparison. Study status: Routine. Procedure: The patient reported no pain pre or post test. Transthoracic echocardiography. Image quality was adequate. Study completion: There were no complications. Transthoracic echocardiography. M-mode, complete 2D, spectral Doppler, and color Doppler. Birthdate: Patient birthdate: 1975-11-01. Age: Patient is 42 yr old. Sex: Gender: female. BMI: 25 kg/m^2. Blood  pressure: 120/54 Patient status: Inpatient. Study date: Study date: 06/18/17. Study time: 09:48 AM. Location: Bedside.  -------------------------------------------------------------------  ------------------------------------------------------------------- Left ventricle: The cavity size was normal. Systolic function was normal. The estimated ejection fraction was in the range of 60% to 65%. Wall motion was normal; there were no regional wall motion abnormalities. Doppler parameters are consistent with abnormal left ventricular relaxation (grade 1 diastolic dysfunction).  ------------------------------------------------------------------- Aortic valve: Trileaflet; moderately thickened leaflets with 1cm x 1cm mass on the right coronary cusp, mild mobility, suggestive of vegetation. If no signs of infection, consider fibroelastoma. Mobility was not restricted. Doppler: There was severe stenosis. There was no regurgitation. VTI ratio of LVOT to aortic valve: 0.42. Valve area (VTI): 1.32 cm^2. Indexed valve area (VTI): 0.74 cm^2/m^2. Peak velocity ratio of LVOT to aortic valve: 0.45. Valve area (Vmax): 1.41 cm^2. Indexed valve area (Vmax): 0.79 cm^2/m^2. Mean velocity ratio of LVOT to aortic valve: 0.41. Valve area (Vmean): 1.28 cm^2. Indexed valve area (Vmean): 0.72 cm^2/m^2. Mean gradient (S): 15 mm Hg. Peak gradient (S): 24 mm Hg.  ------------------------------------------------------------------- Aorta: Aortic root: The aortic root was normal in size.  ------------------------------------------------------------------- Mitral valve: Structurally normal valve. Mobility was not restricted. Doppler: Transvalvular velocity was within the normal range. There was  no evidence for stenosis. There was no regurgitation. Peak gradient (D): 8 mm Hg.  ------------------------------------------------------------------- Left atrium: The atrium was normal in  size.  ------------------------------------------------------------------- Right ventricle: The cavity size was normal. Wall thickness was normal. Systolic function was normal.  TEE  05/25/2017 Study Conclusions  - Left ventricle: The cavity size was normal. Systolic function was normal. The estimated ejection fraction was in the range of 60% to 65%. Wall motion was normal; there were no regional wall motion abnormalities. Doppler parameters are consistent with abnormal left ventricular relaxation (grade 1 diastolic dysfunction). - Aortic valve: Trileaflet; moderately thickened leaflets with 1cm x 1cm mass on the right coronary cusp, mild mobility, suggestive of vegetation. If no signs of infection, consider fibroelastoma. There was severe stenosis. Valve area (VTI): 1.32 cm^2. Valve area (Vmax): 1.41 cm^2. Valve area (Vmean): 1.28 cm^2.  ------------------------------------------------------------------- Study data: No prior study was available for comparison. Study status: Routine. Procedure: The patient reported no pain pre or post test. Transthoracic echocardiography. Image quality was adequate. Study completion: There were no complications. Transthoracic echocardiography. M-mode, complete 2D, spectral Doppler, and color Doppler. Birthdate: Patient birthdate: 1976-01-05. Age: Patient is 42 yr old. Sex: Gender: female. BMI: 25 kg/m^2. Blood pressure: 120/54 Patient status: Inpatient. Study date: Study date: 05/25/2017. Study time: 09:48 AM. Location: Bedside.  -------------------------------------------------------------------  ------------------------------------------------------------------- Left ventricle: The cavity size was normal. Systolic function was normal. The estimated ejection fraction was in the range of 60% to 65%. Wall motion was normal; there were no regional wall motion abnormalities. Doppler parameters are  consistent with abnormal left ventricular relaxation (grade 1 diastolic dysfunction).  ------------------------------------------------------------------- Aortic valve: Trileaflet; moderately thickened leaflets with 1cm x 1cm mass on the right coronary cusp, mild mobility, suggestive of vegetation. If no signs of infection, consider fibroelastoma. Mobility was not restricted. Doppler: There was severe stenosis. There was no regurgitation. VTI ratio of LVOT to aortic valve: 0.42. Valve area (VTI): 1.32 cm^2. Indexed valve area (VTI): 0.74 cm^2/m^2. Peak velocity ratio of LVOT to aortic valve: 0.45. Valve area (Vmax): 1.41 cm^2. Indexed valve area (Vmax): 0.79 cm^2/m^2. Mean velocity ratio of LVOT to aortic valve: 0.41. Valve area (Vmean): 1.28 cm^2. Indexed valve area (Vmean): 0.72 cm^2/m^2. Mean gradient (S): 15 mm Hg. Peak gradient (S): 24 mm Hg.  ------------------------------------------------------------------- Aorta: Aortic root: The aortic root was normal in size.  ------------------------------------------------------------------- Mitral valve: Structurally normal valve. Mobility was not restricted. Doppler: Transvalvular velocity was within the normal range. There was no evidence for stenosis. There was no regurgitation. Peak gradient (D): 8 mm Hg.  ------------------------------------------------------------------- Left atrium: The atrium was normal in size.  ------------------------------------------------------------------- Right ventricle: The cavity size was normal. Wall thickness was normal. Systolic function was normal.    ASSESSMENT AND PLAN:  1.  Severe AoV stenosis: S/P aortic valve repair with a 21 mm mechanical valve - [employer] serial number [id number] by Dr. Nils Pyle. This was completed in the setting of bacterial endocarditis and vegetation on a bicuspid valve. . She remains on IV rocephin at home via PICC line in upper  right arm.   She has been medically complaint. She is followed by our [facility] coumadin clinic for management. She has not yet seen Dr. Nils Pyle yet post hospitalization.    2. Chronic periodontal disease: She will need to be followed by dentist. With any further procedures or extractions, she is to have SBE prophylaxis.   Current medicines are reviewed at length with the patient today.  Lengthy discussion with multiple  questions answered. Greater than 25 minutes.   Labs/ tests ordered today include: None   Phill Myron. West Pugh, ANP, AACC   06/28/2017 4:14 PM    [facility] [address] [place], [place] [address] Phone: [phone]; Fax: [phone]

## 2017-06-28 NOTE — Patient Instructions (Addendum)
Medication Instructions:  NO CHANGES-Your physician recommends that you continue on your current medications as directed. Please refer to the Current Medication list given to you today.  If you need a refill on your cardiac medications before your next appointment, please call your pharmacy.  Follow-Up: Your physician wants you to follow-up in: 3 MONTHS WITH DR Martinique.   Thank you for choosing [facility] at [facility]!!

## 2017-06-29 ENCOUNTER — Ambulatory Visit: Payer: Self-pay | Admitting: Cardiothoracic Surgery

## 2017-07-02 ENCOUNTER — Ambulatory Visit (HOSPITAL_COMMUNITY): Payer: 59 | Admitting: Dentistry

## 2017-07-02 ENCOUNTER — Ambulatory Visit (INDEPENDENT_AMBULATORY_CARE_PROVIDER_SITE_OTHER): Payer: 59 | Admitting: Pharmacist Clinician (PhC)/ Clinical Pharmacy Specialist

## 2017-07-02 DIAGNOSIS — Z5181 Encounter for therapeutic drug level monitoring: Secondary | ICD-10-CM | POA: Diagnosis not present

## 2017-07-02 DIAGNOSIS — Z952 Presence of prosthetic heart valve: Secondary | ICD-10-CM | POA: Diagnosis not present

## 2017-07-02 LAB — POCT INR: INR: 1.9

## 2017-07-03 ENCOUNTER — Ambulatory Visit (HOSPITAL_COMMUNITY): Payer: 59 | Admitting: Dentistry

## 2017-07-09 ENCOUNTER — Ambulatory Visit: Payer: 59 | Admitting: Adult Health

## 2017-07-11 ENCOUNTER — Ambulatory Visit: Payer: 59 | Admitting: Cardiothoracic Surgery

## 2017-07-12 ENCOUNTER — Ambulatory Visit (INDEPENDENT_AMBULATORY_CARE_PROVIDER_SITE_OTHER): Payer: 59 | Admitting: Pharmacist Clinician (PhC)/ Clinical Pharmacy Specialist

## 2017-07-12 DIAGNOSIS — Z952 Presence of prosthetic heart valve: Secondary | ICD-10-CM | POA: Diagnosis not present

## 2017-07-12 DIAGNOSIS — Z5181 Encounter for therapeutic drug level monitoring: Secondary | ICD-10-CM

## 2017-07-12 LAB — POCT INR: INR: 1.9

## 2017-07-15 ENCOUNTER — Other Ambulatory Visit: Payer: Self-pay

## 2017-07-15 ENCOUNTER — Encounter (HOSPITAL_COMMUNITY): Payer: Self-pay | Admitting: Emergency Medicine

## 2017-07-15 ENCOUNTER — Emergency Department (HOSPITAL_COMMUNITY)
Admission: EM | Admit: 2017-07-15 | Discharge: 2017-07-15 | Disposition: A | Discharge status: Home / Self Care | Payer: 59 | Attending: Emergency Medicine | Admitting: Emergency Medicine

## 2017-07-15 DIAGNOSIS — Z79899 Other long term (current) drug therapy: Secondary | ICD-10-CM | POA: Insufficient documentation

## 2017-07-15 DIAGNOSIS — Z7982 Long term (current) use of aspirin: Secondary | ICD-10-CM | POA: Diagnosis not present

## 2017-07-15 DIAGNOSIS — Z7901 Long term (current) use of anticoagulants: Secondary | ICD-10-CM | POA: Insufficient documentation

## 2017-07-15 DIAGNOSIS — J029 Acute pharyngitis, unspecified: Secondary | ICD-10-CM | POA: Diagnosis present

## 2017-07-15 DIAGNOSIS — J101 Influenza due to other identified influenza virus with other respiratory manifestations: Secondary | ICD-10-CM | POA: Diagnosis not present

## 2017-07-15 LAB — CBC WITH DIFFERENTIAL/PLATELET
BASOS ABS: 0 10*3/uL (ref 0.0–0.1)
Basophils Relative: 0 %
Eosinophils Absolute: 0 10*3/uL (ref 0.0–0.7)
Eosinophils Relative: 0 %
HEMATOCRIT: 39 % (ref 36.0–46.0)
Hemoglobin: 12.6 g/dL (ref 12.0–15.0)
Lymphocytes Relative: 19 %
Lymphs Abs: 2.3 10*3/uL (ref 0.7–4.0)
MCH: 26.7 pg (ref 26.0–34.0)
MCHC: 32.3 g/dL (ref 30.0–36.0)
MCV: 82.6 fL (ref 78.0–100.0)
Monocytes Absolute: 0.8 10*3/uL (ref 0.1–1.0)
Monocytes Relative: 7 %
NEUTROS ABS: 8.8 10*3/uL — AB (ref 1.7–7.7)
Neutrophils Relative %: 74 %
PLATELETS: 282 10*3/uL (ref 150–400)
RBC: 4.72 MIL/uL (ref 3.87–5.11)
RDW: 14.4 % (ref 11.5–15.5)
WBC: 12 10*3/uL — AB (ref 4.0–10.5)

## 2017-07-15 LAB — I-STAT CG4 LACTIC ACID, ED: Lactic Acid, Venous: 1.32 mmol/L (ref 0.5–1.9)

## 2017-07-15 LAB — COMPREHENSIVE METABOLIC PANEL
ALT: 10 U/L — ABNORMAL LOW (ref 14–54)
ANION GAP: 12 (ref 5–15)
AST: 21 U/L (ref 15–41)
Albumin: 3.6 g/dL (ref 3.5–5.0)
Alkaline Phosphatase: 64 U/L (ref 38–126)
BILIRUBIN TOTAL: 1.4 mg/dL — AB (ref 0.3–1.2)
BUN: 7 mg/dL (ref 6–20)
CO2: 22 mmol/L (ref 22–32)
Calcium: 9.1 mg/dL (ref 8.9–10.3)
Chloride: 101 mmol/L (ref 101–111)
Creatinine, Ser: 0.86 mg/dL (ref 0.44–1.00)
Glucose, Bld: 89 mg/dL (ref 65–99)
POTASSIUM: 3.3 mmol/L — AB (ref 3.5–5.1)
Sodium: 135 mmol/L (ref 135–145)
TOTAL PROTEIN: 8.4 g/dL — AB (ref 6.5–8.1)

## 2017-07-15 MED ORDER — DEXAMETHASONE 1 MG/ML PO CONC: 10.0000 mg | Freq: Once | ORAL | Status: DC

## 2017-07-15 MED ORDER — HYDROCODONE-ACETAMINOPHEN 7.5-325 MG/15ML PO SOLN: 15.0000 mL | Freq: Four times a day (QID) | ORAL | 0 refills | Status: DC | PRN

## 2017-07-15 MED ORDER — DEXAMETHASONE 4 MG PO TABS
10.0000 mg | ORAL_TABLET | Freq: Once | ORAL | Status: AC
  Administered 2017-07-15: 10 mg via ORAL
  Filled 2017-07-15: qty 3

## 2017-07-15 MED ORDER — SODIUM CHLORIDE 0.9 % IV BOLUS (SEPSIS)
500.0000 mL | Freq: Once | INTRAVENOUS | Status: AC
  Administered 2017-07-15: 500 mL via INTRAVENOUS

## 2017-07-15 MED ORDER — OSELTAMIVIR PHOSPHATE 75 MG PO CAPS
75.0000 mg | ORAL_CAPSULE | Freq: Once | ORAL | Status: AC
Start: 2017-07-15 — End: 2017-07-15
  Administered 2017-07-15: 75 mg via ORAL
  Filled 2017-07-15: qty 1

## 2017-07-15 MED ORDER — HYDROCODONE-ACETAMINOPHEN 7.5-325 MG/15ML PO SOLN
10.0000 mL | Freq: Once | ORAL | Status: AC
  Administered 2017-07-15: 10 mL via ORAL
  Filled 2017-07-15: qty 15

## 2017-07-15 MED ORDER — OSELTAMIVIR PHOSPHATE 75 MG PO CAPS: 75.0000 mg | ORAL_CAPSULE | Freq: Two times a day (BID) | ORAL | 0 refills | Status: DC

## 2017-07-15 NOTE — ED Triage Notes (Signed)
Patient arrived from urgent care. Negative strep. Positive flu. Complaints of soreness in throat and difficulty swallowing. Elevated temperature and difficulty swallowing saliva.

## 2017-07-15 NOTE — Discharge Instructions (Signed)
Please rest and drink plenty of fluids Take Hycet for pain and fever Take Tamiflu twice daily for 5 days Follow up with your doctor Return if worsening

## 2017-07-15 NOTE — ED Provider Notes (Signed)
[place] EMERGENCY DEPARTMENT Provider Note   CSN: [id number] Arrival date & time: 07/15/17  1526     History   Chief Complaint Chief Complaint  Patient presents with  . Sore Throat    HPI Krystal Shaffer is a 42 y.o. female who presents for evaluation for sepsis and a sore throat. PMH significant for hx of endocarditis s/p aortic valve replacement in December 2018. She states that over the past three days she has developed a fever, chills, back pain, and a sore throat. She went to UC today and she had a fever of 101.1 and was mildly tachycardic. She was tested for strep and the flu and Influenza A was positive. The provider was concerned for sepsis and a PTA due to more swelling of her throat on the left side. The patient is able to swallow but it's painful. She denies chest pain or SOB. She took Tylenol prior to arrival.  HPI  Past Medical History:  Diagnosis Date  . Cystitis 05/2017  . Family history of adverse reaction to anesthesia    " mother has a hard time waking "  . Migraine   . Migraines     Patient Active Problem List   Diagnosis Date Noted  . Encounter for therapeutic drug monitoring 06/11/2017  . Aortic valve replaced 06/11/2017  . Endocarditis 06/04/2017  . Right-sided chest pain   . Bacteremia due to Streptococcus 05/26/2017  . Aortic valve endocarditis 05/26/2017  . Diastolic murmur 54/62/7035  . Bacterial vaginosis 05/26/2017  . Atypical chest pain 05/26/2017  . Temporal headache 05/26/2017  . Streptococcal sepsis ([place]) 05/26/2017  . Sepsis ([place]) 05/24/2017  . Acute pyelonephritis 05/18/2017  . Back pain 05/18/2017  . Nausea and vomiting 05/18/2017  . UTI (urinary tract infection) 05/18/2017    Past Surgical History:  Procedure Laterality Date  . AORTIC VALVE REPLACEMENT N/A 06/04/2017   Procedure: AORTIC VALVE REPLACEMENT (AVR);  Surgeon: Prescott Gum, Collier Salina, MD;  Location: [place];  Service: Open Heart Surgery;  Laterality: N/A;    . MULTIPLE EXTRACTIONS WITH ALVEOLOPLASTY N/A 05/31/2017   Procedure: Extraction of tooth  #'s [phone] with alveoloplasty and gross debridement of remaining dentition;  Surgeon: Lenn Cal, DDS;  Location: [place];  Service: Oral Surgery;  Laterality: N/A;  . TEE WITHOUT CARDIOVERSION N/A 05/29/2017   Procedure: TRANSESOPHAGEAL ECHOCARDIOGRAM (TEE);  Surgeon: Jerline Pain, MD;  Location: [facility] ENDOSCOPY;  Service: Cardiovascular;  Laterality: N/A;  . TEE WITHOUT CARDIOVERSION N/A 06/04/2017   Procedure: TRANSESOPHAGEAL ECHOCARDIOGRAM (TEE);  Surgeon: Prescott Gum, Collier Salina, MD;  Location: [place];  Service: Open Heart Surgery;  Laterality: N/A;  . WISDOM TOOTH EXTRACTION      OB History    No data available       Home Medications    Prior to Admission medications   Medication Sig Start Date End Date Taking? Authorizing Provider  aspirin EC 81 MG EC tablet Take 1 tablet (81 mg total) by mouth daily. 06/10/17   Jadene Pierini E, PA-C  cefTRIAXone (ROCEPHIN) IVPB Inject 2 g into the vein daily. Indication: endocarditis Last Day of Therapy:  07/01/17 Labs - Once weekly:  CBC/D and BMP, Labs - Every other week:  ESR and CRP 06/09/17   Gold, Wayne E, PA-C  metoprolol tartrate (LOPRESSOR) 25 MG tablet Take 1 tablet (25 mg total) by mouth 2 (two) times daily. 06/09/17   John Giovanni, PA-C  warfarin (COUMADIN) 5 MG tablet Take 1 tablet (5 mg  total) by mouth daily at 6 PM. As directed by the coumadin clinic 06/09/17   John Giovanni, PA-C    Family History Family History  Problem Relation Age of Onset  . Obesity Mother   . Hypertension Sister     Social History Social History   Tobacco Use  . Smoking status: Never Smoker  . Smokeless tobacco: Never Used  Substance Use Topics  . Alcohol use: No  . Drug use: No     Allergies   Patient has no known allergies.   Review of Systems Review of Systems  Constitutional: Positive for chills and fever.  HENT: Positive for sore throat.  Negative for congestion.   Respiratory: Negative for shortness of breath.   Cardiovascular: Negative for chest pain.  Musculoskeletal: Positive for back pain and myalgias.  Neurological: Positive for headaches.  All other systems reviewed and are negative.    Physical Exam Updated Vital Signs BP 134/88 (BP Location: Right Arm)   Pulse (!) 105   Temp 99.5 F (37.5 C) (Oral)   Resp 16   Ht _0  (1.626 m)   Wt 63.5 kg (140 lb)   LMP 05/31/2017   SpO2 100%   BMI 24.03 kg/m   Physical Exam  Constitutional: She is oriented to person, place, and time. She appears well-developed and well-nourished. No distress.  Mildly ill appearing  HENT:  Head: Normocephalic and atraumatic.  Mouth/Throat: Uvula is midline, oropharynx is clear and moist and mucous membranes are normal. Tonsils are 2+ on the right. Tonsils are 2+ on the left. Tonsillar exudate.  Eyes: Conjunctivae are normal. Pupils are equal, round, and reactive to light. Right eye exhibits no discharge. Left eye exhibits no discharge. No scleral icterus.  Neck: Normal range of motion.  Cardiovascular: Regular rhythm. Tachycardia present. Exam reveals no gallop and no friction rub.  No murmur heard. Pulmonary/Chest: Effort normal and breath sounds normal. No stridor. No respiratory distress. She has no wheezes. She has no rales. She exhibits no tenderness.  Abdominal: She exhibits no distension.  Neurological: She is alert and oriented to person, place, and time.  Skin: Skin is warm and dry.  Psychiatric: She has a normal mood and affect. Her behavior is normal.  Nursing note and vitals reviewed.    ED Treatments / Results  Labs (all labs ordered are listed, but only abnormal results are displayed) Labs Reviewed  COMPREHENSIVE METABOLIC PANEL - Abnormal; Notable for the following components:      Result Value   Potassium 3.3 (*)    Total Protein 8.4 (*)    ALT 10 (*)    Total Bilirubin 1.4 (*)    All other components  within normal limits  CBC WITH DIFFERENTIAL/PLATELET - Abnormal; Notable for the following components:   WBC 12.0 (*)    Neutro Abs 8.8 (*)    All other components within normal limits  CULTURE, BLOOD (ROUTINE X 2)  CULTURE, BLOOD (ROUTINE X 2)  I-STAT CG4 LACTIC ACID, ED    EKG  EKG Interpretation None       Radiology No results found.  Procedures Procedures (including critical care time)  Medications Ordered in ED Medications  oseltamivir (TAMIFLU) capsule 75 mg (not administered)  HYDROcodone-acetaminophen (HYCET) 7.5-325 mg/15 ml solution 10 mL (10 mLs Oral Given 07/15/17 2022)  dexamethasone (DECADRON) tablet 10 mg (10 mg Oral Given 07/15/17 2021)  sodium chloride 0.9 % bolus 500 mL (500 mLs Intravenous New Bag/Given 07/15/17 2113)    Initial  Impression / Assessment and Plan / ED Course  I have reviewed the triage vital signs and the nursing notes.  Pertinent labs & imaging results that were available during my care of the patient were reviewed by me and considered in my medical decision making (see chart for details).  42 year old female presents for evaluation for sepsis.  Previous provider was concerned for a peritonsillar abscess.  She did have a positive flu test at urgent care.  She was febrile and tachycardic at urgent care.  She is not febrile but is still mildly tachycardic in the ED here.  Blood pressure is normal and she is not hypoxic.  Exam is not very concerning for a peritonsillar abscess although she does have bilateral swollen tonsils.  Will obtain labs, blood cultures, and give steroids, pain medicine, fluids  CBC is remarkable for mild leukocytosis of 12.  CMP is remarkable for mild hypokalemia (3.3).  Lactic acid is normal.  Blood cultures were drawn. Discussed with Dr. Lita Mains. At this time we will treat with Tamiflu and provide prescription for Hycet.  Supportive care was discussed. After treatment she feels better. Tachycardia has improved. She was  advised to call her doctor tomorrow for a follow-up.  She was given strict return precautions.                                                                                                                                                                                                                                    Final Clinical Impressions(s) / ED Diagnoses   Final diagnoses:  Influenza A    ED Discharge Orders    None       Recardo Evangelist, PA-C 07/15/17 2136    Julianne Rice, MD 07/19/17 [phone]

## 2017-07-17 ENCOUNTER — Other Ambulatory Visit: Payer: 59

## 2017-07-18 ENCOUNTER — Encounter: Payer: Self-pay | Admitting: Cardiothoracic Surgery

## 2017-07-18 ENCOUNTER — Ambulatory Visit (INDEPENDENT_AMBULATORY_CARE_PROVIDER_SITE_OTHER): Payer: Self-pay | Admitting: Cardiothoracic Surgery

## 2017-07-18 ENCOUNTER — Ambulatory Visit
Admission: RE | Admit: 2017-07-18 | Discharge: 2017-07-18 | Disposition: A | Discharge status: Home / Self Care | Payer: 59 | Source: Ambulatory Visit | Attending: Cardiothoracic Surgery | Admitting: Cardiothoracic Surgery

## 2017-07-18 ENCOUNTER — Other Ambulatory Visit: Payer: 59

## 2017-07-18 VITALS — BP 135/98 | HR 82 | Resp 20 | Ht 64.0 in | Wt 148.0 lb

## 2017-07-18 DIAGNOSIS — R7881 Bacteremia: Secondary | ICD-10-CM

## 2017-07-18 DIAGNOSIS — I358 Other nonrheumatic aortic valve disorders: Secondary | ICD-10-CM

## 2017-07-18 DIAGNOSIS — Z952 Presence of prosthetic heart valve: Secondary | ICD-10-CM

## 2017-07-18 DIAGNOSIS — I351 Nonrheumatic aortic (valve) insufficiency: Secondary | ICD-10-CM

## 2017-07-18 DIAGNOSIS — B955 Unspecified streptococcus as the cause of diseases classified elsewhere: Secondary | ICD-10-CM

## 2017-07-18 NOTE — Progress Notes (Signed)
PCP is Sofie Rower, PA-C Referring Provider is Martinique, Lorenza Shakir M, MD  Chief Complaint  Patient presents with  . Routine Post Op    f/u from surgery with CXR s/p 06/04/17    HPI: 1 month status post aortic valve replacement for endocarditis and aortic insufficiency 21 mm mechanical [employer] valve Patient in sinus rhythm without symptoms of angina or CHF Surgical incision well-healed Chest x-ray clear Coumadin dosing by the Coumadin clinic, no bleeding problems  Patient states she has seen her family dentist for dental restoration. She was told that she should hold off on elective dental work until 3 months after surgery. She was told she should take amoxicillin 2 g by mouth one hour before any invasive dental procedure and was given a prescription with refills.  Otherwise patient is progressing well. She is ready to drive and start doing light activities.  Past Medical History:  Diagnosis Date  . Cystitis 05/2017  . Family history of adverse reaction to anesthesia    " mother has a hard time waking "  . Migraine   . Migraines     Past Surgical History:  Procedure Laterality Date  . AORTIC VALVE REPLACEMENT N/A 06/04/2017   Procedure: AORTIC VALVE REPLACEMENT (AVR);  Surgeon: Prescott Gum, Collier Salina, MD;  Location: [place];  Service: Open Heart Surgery;  Laterality: N/A;  . MULTIPLE EXTRACTIONS WITH ALVEOLOPLASTY N/A 05/31/2017   Procedure: Extraction of tooth  #'s [phone] with alveoloplasty and gross debridement of remaining dentition;  Surgeon: Lenn Cal, DDS;  Location: [place];  Service: Oral Surgery;  Laterality: N/A;  . TEE WITHOUT CARDIOVERSION N/A 05/29/2017   Procedure: TRANSESOPHAGEAL ECHOCARDIOGRAM (TEE);  Surgeon: Jerline Pain, MD;  Location: [facility] ENDOSCOPY;  Service: Cardiovascular;  Laterality: N/A;  . TEE WITHOUT CARDIOVERSION N/A 06/04/2017   Procedure: TRANSESOPHAGEAL ECHOCARDIOGRAM (TEE);  Surgeon: Prescott Gum, Collier Salina, MD;  Location: [place];  Service: Open  Heart Surgery;  Laterality: N/A;  . WISDOM TOOTH EXTRACTION      Family History  Problem Relation Age of Onset  . Obesity Mother   . Hypertension Sister     Social History Social History   Tobacco Use  . Smoking status: Never Smoker  . Smokeless tobacco: Never Used  Substance Use Topics  . Alcohol use: No  . Drug use: No    Current Outpatient Medications  Medication Sig Dispense Refill  . aspirin EC 81 MG EC tablet Take 1 tablet (81 mg total) by mouth daily.    . metoprolol tartrate (LOPRESSOR) 25 MG tablet Take 1 tablet (25 mg total) by mouth 2 (two) times daily. 60 tablet 1  . warfarin (COUMADIN) 5 MG tablet Take 1 tablet (5 mg total) by mouth daily at 6 PM. As directed by the coumadin clinic 100 tablet 1   No current facility-administered medications for this visit.     No Known Allergies  Review of Systems  Evaluated in the ED for viral syndrome last month. Blood cultures were taken which are negative  PICC line has been removed. She has completed her postop course of IV Rocephin for strep viridans  BP (!) 135/98   Pulse 82   Resp 20   Ht 5\' 4"  (1.626 m)   Wt 148 lb (67.1 kg)   SpO2 99% Comment: RA  BMI 25.40 kg/m  Physical Exam   Diagnostic Tests: Chest x-ray today is clear  Impression: Excellent early recovery after aVR for endocarditis with mechanical valve because of her young age.  She is compliant with Coumadin She understands to hold off on elective dental work until 3 months after surgery and then she will use amoxicillin prophylaxis. She will return for review of progress in 8 weeks.  Plan: Return for review in 8 weeks Continue metoprolol until return.  Len Childs, MD [facility] [phone]

## 2017-07-20 ENCOUNTER — Telehealth (HOSPITAL_COMMUNITY): Payer: Self-pay

## 2017-07-20 LAB — CULTURE, BLOOD (ROUTINE X 2)
Culture: NO GROWTH
Culture: NO GROWTH
SPECIAL REQUESTS: ADEQUATE
Special Requests: ADEQUATE

## 2017-07-20 NOTE — Telephone Encounter (Signed)
Called and spoke with patient in regards to Cardiac Rehab - Patient stated she will call back when she gets home to further discuss.

## 2017-07-24 LAB — CULTURE, BLOOD (SINGLE)
MICRO NUMBER: 90160361
MICRO NUMBER: 90160362

## 2017-07-30 ENCOUNTER — Telehealth (HOSPITAL_COMMUNITY): Payer: Self-pay

## 2017-07-30 ENCOUNTER — Encounter: Payer: Self-pay | Admitting: Infectious Disease

## 2017-07-30 NOTE — Telephone Encounter (Signed)
Called to follow up with patient in regards to Cardiac Rehab - Patient stated that she is unsure if she can afford the program and she wants to call the billing department to figure out what she can do with her current balances with the hospital before agreeing to the program. Patient is interested so I offered Cardiac Rehab Maintenance. Patient is going to give me a call back. If no phone call, will follow up with patient.

## 2017-08-02 ENCOUNTER — Ambulatory Visit (INDEPENDENT_AMBULATORY_CARE_PROVIDER_SITE_OTHER): Payer: 59 | Admitting: Pharmacist Clinician (PhC)/ Clinical Pharmacy Specialist

## 2017-08-02 DIAGNOSIS — Z5181 Encounter for therapeutic drug level monitoring: Secondary | ICD-10-CM | POA: Diagnosis not present

## 2017-08-02 DIAGNOSIS — Z952 Presence of prosthetic heart valve: Secondary | ICD-10-CM

## 2017-08-02 LAB — POCT INR: INR: 2.1

## 2017-08-02 NOTE — Patient Instructions (Signed)
Description   Continue with 1 tablet daily except 1/2 tablet each Sunday, Tuesday, and Thursday.  Repeat INR in 2 weeks

## 2017-08-13 ENCOUNTER — Other Ambulatory Visit: Payer: Self-pay | Admitting: *Deleted

## 2017-08-13 MED ORDER — METOPROLOL TARTRATE 25 MG PO TABS: 25.0000 mg | ORAL_TABLET | Freq: Two times a day (BID) | ORAL | 11 refills | Status: DC

## 2017-08-14 ENCOUNTER — Other Ambulatory Visit: Payer: Self-pay | Admitting: *Deleted

## 2017-08-14 ENCOUNTER — Encounter: Payer: Self-pay | Admitting: Cardiothoracic Surgery

## 2017-08-14 MED ORDER — METOPROLOL TARTRATE 25 MG PO TABS: 25.0000 mg | ORAL_TABLET | Freq: Two times a day (BID) | ORAL | 4 refills | Status: DC

## 2017-08-15 ENCOUNTER — Telehealth (HOSPITAL_COMMUNITY): Payer: Self-pay

## 2017-08-15 NOTE — Telephone Encounter (Signed)
Called to follow up with patient in regards to Cardiac Rehab - Patient stated she was able to get on a financial plan to pay off hospital bills and cannot afford the program currently. Patient would like for me to follow up with her in a month.

## 2017-09-12 ENCOUNTER — Ambulatory Visit (INDEPENDENT_AMBULATORY_CARE_PROVIDER_SITE_OTHER): Payer: 59 | Admitting: Cardiothoracic Surgery

## 2017-09-12 ENCOUNTER — Other Ambulatory Visit: Payer: Self-pay

## 2017-09-12 ENCOUNTER — Encounter: Payer: Self-pay | Admitting: Cardiothoracic Surgery

## 2017-09-12 VITALS — BP 167/106 | HR 69 | Resp 18 | Ht 64.0 in | Wt 157.8 lb

## 2017-09-12 DIAGNOSIS — I10 Essential (primary) hypertension: Secondary | ICD-10-CM

## 2017-09-12 DIAGNOSIS — Z952 Presence of prosthetic heart valve: Secondary | ICD-10-CM | POA: Diagnosis not present

## 2017-09-12 DIAGNOSIS — I358 Other nonrheumatic aortic valve disorders: Secondary | ICD-10-CM

## 2017-09-12 NOTE — Progress Notes (Signed)
PCP is Sofie Rower, PA-C Referring Provider is Martinique, Lonzo Saulter M, MD  Chief Complaint  Patient presents with  . Aortic Stenosis    f/u s/u AVR 06/04/2017    HPI: Follow-up almost 3 months after aortic valve replacement for aortic endocarditis.  A mechanical valve was placed because of the patient's age of 43 years.  She is doing well without symptoms of heart failure.  The sternal incision is now healed.  Her Coumadin therapy is working out well.   patient had extraction of infected teeth prior to surgery and she now wishes to proceed with getting partial plates.  She was given prescription for amoxicillin to be taken 1 hour before any dental instrumentation Last chest x-ray was clear  Past Medical History:  Diagnosis Date  . Cystitis 05/2017  . Family history of adverse reaction to anesthesia    " mother has a hard time waking "  . Migraine   . Migraines     Past Surgical History:  Procedure Laterality Date  . AORTIC VALVE REPLACEMENT N/A 06/04/2017   Procedure: AORTIC VALVE REPLACEMENT (AVR);  Surgeon: Prescott Gum, Collier Salina, MD;  Location: [place];  Service: Open Heart Surgery;  Laterality: N/A;  . MULTIPLE EXTRACTIONS WITH ALVEOLOPLASTY N/A 05/31/2017   Procedure: Extraction of tooth  #'s [phone] with alveoloplasty and gross debridement of remaining dentition;  Surgeon: Lenn Cal, DDS;  Location: [place];  Service: Oral Surgery;  Laterality: N/A;  . TEE WITHOUT CARDIOVERSION N/A 05/29/2017   Procedure: TRANSESOPHAGEAL ECHOCARDIOGRAM (TEE);  Surgeon: Jerline Pain, MD;  Location: [facility] ENDOSCOPY;  Service: Cardiovascular;  Laterality: N/A;  . TEE WITHOUT CARDIOVERSION N/A 06/04/2017   Procedure: TRANSESOPHAGEAL ECHOCARDIOGRAM (TEE);  Surgeon: Prescott Gum, Collier Salina, MD;  Location: [place];  Service: Open Heart Surgery;  Laterality: N/A;  . WISDOM TOOTH EXTRACTION      Family History  Problem Relation Age of Onset  . Obesity Mother   . Hypertension Sister     Social  History Social History   Tobacco Use  . Smoking status: Never Smoker  . Smokeless tobacco: Never Used  Substance Use Topics  . Alcohol use: No  . Drug use: No    Current Outpatient Medications  Medication Sig Dispense Refill  . aspirin EC 81 MG EC tablet Take 1 tablet (81 mg total) by mouth daily.    . metoprolol tartrate (LOPRESSOR) 25 MG tablet Take 1 tablet (25 mg total) by mouth 2 (two) times daily. 60 tablet 4  . warfarin (COUMADIN) 5 MG tablet Take 1 tablet (5 mg total) by mouth daily at 6 PM. As directed by the coumadin clinic 100 tablet 1   No current facility-administered medications for this visit.     No Known Allergies  Review of Systems  Good appetite Energy improved No fever No bleeding from Coumadin  BP (!) 167/106 (BP Location: Right Arm, Patient Position: Sitting, Cuff Size: Normal)   Pulse 69   Resp 18   Ht 5\' 4"  (1.626 m)   Wt 157 lb 12.8 oz (71.6 kg)   SpO2 99% Comment: RA  BMI 27.09 kg/m  Physical Exam      Exam    General- alert and comfortable    Neck- no JVD, no cervical adenopathy palpable, no carotid bruit   Lungs- clear without rales, wheezes   Cor- regular rate and rhythm, no murmur , gallop   Abdomen- soft, non-tender   Extremities - warm, non-tender, minimal edema   Neuro- oriented, appropriate,  no focal weakness   Diagnostic Tests: None  Impression: Status post AVR for endocarditis-doing well.  Patient has appointment to be seen by cardiology later this month  Hypertension with blood pressure 169/100-patient given a starter prescription for lisinopril 10 mg daily to take in addition to her metoprolol.  This will be reviewed by her cardiologist and primary care physician.  Plan: Return in 1 month to review progress and to discuss return to work and for blood pressure check.  Her sternal precautions are now lifted at 3 months postop  Len Childs, MD [facility] [phone]

## 2017-09-13 ENCOUNTER — Other Ambulatory Visit: Payer: Self-pay | Admitting: Cardiothoracic Surgery

## 2017-09-17 ENCOUNTER — Encounter: Payer: Self-pay | Admitting: Cardiothoracic Surgery

## 2017-09-20 ENCOUNTER — Telehealth (HOSPITAL_COMMUNITY): Payer: Self-pay

## 2017-09-20 ENCOUNTER — Encounter: Payer: Self-pay | Admitting: Cardiology

## 2017-09-20 NOTE — Telephone Encounter (Signed)
Called patient to follow up in regards to Cardiac Rehab - Patient stated this was perfect timing as she is going back to work so her financial situation is a little better. Patient is currently at the DR right now and will call back when she leaves.

## 2017-09-21 ENCOUNTER — Telehealth (HOSPITAL_COMMUNITY): Payer: Self-pay

## 2017-09-21 NOTE — Telephone Encounter (Signed)
Called to follow up with patient in regards to Cardiac Rehab - Patient stated she is going back to work and the earliest she can get off is 4:20. Explained to patient that our last class is at 2:45pm. She stated that will not work. Closed referral.

## 2017-09-24 NOTE — Progress Notes (Signed)
Cardiology Office Note   Date:  09/26/2017   ID:  Collette Shaffer, DOB December 02, 1975, MRN [id number]  PCP:  Krystal Carol, MD  Cardiologist:  Krystal, Jermy Couper MD  Chief Complaint  Patient presents with  . Hypertension     History of Present Illness: Krystal Shaffer is a 42 y.o. female who is seen for follow up AVR  due to bacterial endocarditis, and severe aortic insufficiency of a bicuspid AoV .  She initially presented in early December 2018 with an E coli UTI that failed to clear with oral antibiotics. She was treated with IV antibiotics. She was readmitted 10 days later with persistent fever. Blood cultures grew strep viridans. Dental evaluation was positive for multiple dental abscesses. She had dental extractions. On 06/04/17 she underwent AVR with Dr. Prescott Shaffer.  Aortic valve replacement with a 21 mm mechanical valve - Sorin [employer] serial number [id number]. She completed outpatient antibiotics. When seen earlier this month was noted to be hypertensive and lisinopril was added to her therapy. She has not started this yet.  She does note a little catch in her chest and some burning sensation on her scar. Only notes dyspnea when singing in church. No edema. Does note insomnia that was not improved with ambien.    Past Medical History:  Diagnosis Date  . Cystitis 05/2017  . Family history of adverse reaction to anesthesia    " mother has a hard time waking "  . Migraine   . Migraines     Past Surgical History:  Procedure Laterality Date  . AORTIC VALVE REPLACEMENT N/A 06/04/2017   Procedure: AORTIC VALVE REPLACEMENT (AVR);  Surgeon: Krystal Shaffer, Krystal Salina, MD;  Location: [place];  Service: Open Heart Surgery;  Laterality: N/A;  . MULTIPLE EXTRACTIONS WITH ALVEOLOPLASTY N/A 05/31/2017   Procedure: Extraction of tooth  #'s [phone] with alveoloplasty and gross debridement of remaining dentition;  Surgeon: Krystal Shaffer, DDS;  Location: [place];  Service: Oral Surgery;  Laterality:  N/A;  . TEE WITHOUT CARDIOVERSION N/A 05/29/2017   Procedure: TRANSESOPHAGEAL ECHOCARDIOGRAM (TEE);  Surgeon: Krystal Pain, MD;  Location: [facility] ENDOSCOPY;  Service: Cardiovascular;  Laterality: N/A;  . TEE WITHOUT CARDIOVERSION N/A 06/04/2017   Procedure: TRANSESOPHAGEAL ECHOCARDIOGRAM (TEE);  Surgeon: Krystal Shaffer, Krystal Salina, MD;  Location: [place];  Service: Open Heart Surgery;  Laterality: N/A;  . WISDOM TOOTH EXTRACTION       Current Outpatient Medications  Medication Sig Dispense Refill  . aspirin EC 81 MG EC tablet Take 1 tablet (81 mg total) by mouth daily.    . medroxyPROGESTERone (DEPO-PROVERA) 150 MG/ML injection Inject 150 mg into the muscle every 3 (three) months.    . metoprolol tartrate (LOPRESSOR) 25 MG tablet Take 1 tablet (25 mg total) by mouth 2 (two) times daily. 60 tablet 4  . warfarin (COUMADIN) 5 MG tablet Take 1 tablet (5 mg total) by mouth daily at 6 PM. As directed by the coumadin clinic 100 tablet 1  . lisinopril (PRINIVIL,ZESTRIL) 10 MG tablet Take 1 tablet (10 mg total) by mouth daily. 90 tablet 3   No current facility-administered medications for this visit.     Allergies:   Patient has no known allergies.    Social History:  The patient  reports that she has never smoked. She has never used smokeless tobacco. She reports that she does not drink alcohol or use drugs.   Family History:  The patient's family history includes Hypertension in her sister; Obesity in her mother.  ROS: All other systems are reviewed and negative. Unless otherwise mentioned in H&P    PHYSICAL EXAM: VS:  BP (!) 160/100   Pulse 82   Ht 5\' 4"  (1.626 m)   Wt 156 lb 12.8 oz (71.1 kg)   BMI 26.91 kg/m  , BMI Body mass index is 26.91 kg/m. GEN: Well nourished, well developed BF  in no acute distress  HEENT: normal  Neck: no JVD, carotid bruits, or masses Cardiac: RRR 5-0/2 systolic murmur with crisp mechanical valve click, rubs, or gallops,no edema  Sternal incision is healing well.    Respiratory:  clear to auscultation bilaterally, normal work of breathing GI: soft, nontender, nondistended, + BS MS: no deformity or atrophy  Skin: warm and dry, no rash Neuro:  Strength and sensation are intact Psych: euthymic mood, full affect   Recent Labs: June 17, 2017: TSH 3.337 06/05/2017: Magnesium 1.8 07/15/2017: ALT 10; BUN 7; Creatinine, Ser 0.86; Hemoglobin 12.6; Platelets 282; Potassium 3.3; Sodium 135    Lipid Panel No results found for: CHOL, TRIG, HDL, CHOLHDL, VLDL, LDLCALC, LDLDIRECT   Labs dated 08/30/17: cholesterol 130, triglycerides 46, HDL 50, LDL 71. Hgb 10.8. Normal chemistries and TSH.    Wt Readings from Last 3 Encounters:  09/26/17 156 lb 12.8 oz (71.1 kg)  09/12/17 157 lb 12.8 oz (71.6 kg)  07/18/17 148 lb (67.1 kg)      Other studies Reviewed: Echocardiogram 06-17-17 Study Conclusions  - Left ventricle: The cavity size was normal. Systolic function was normal. The estimated ejection fraction was in the range of 60% to 65%. Wall motion was normal; there were no regional wall motion abnormalities. Doppler parameters are consistent with abnormal left ventricular relaxation (grade 1 diastolic dysfunction). - Aortic valve: Trileaflet; moderately thickened leaflets with 1cm x 1cm mass on the right coronary cusp, mild mobility, suggestive of vegetation. If no signs of infection, consider fibroelastoma. There was severe stenosis. Valve area (VTI): 1.32 cm^2. Valve area (Vmax): 1.41 cm^2. Valve area (Vmean): 1.28 cm^2.  ------------------------------------------------------------------- Study data: No prior study was available for comparison. Study status: Routine. Procedure: The patient reported no Shaffer pre or post test. Transthoracic echocardiography. Image quality was adequate. Study completion: There were no complications. Transthoracic echocardiography. M-mode, complete 2D, spectral Doppler, and color Doppler.  Birthdate: Patient birthdate: 10-30-1975. Age: Patient is 42 yr old. Sex: Gender: female. BMI: 25 kg/m^2. Blood pressure: 120/54 Patient status: Inpatient. Study date: Study date: 17-Jun-2017. Study time: 09:48 AM. Location: Bedside.  -------------------------------------------------------------------  ------------------------------------------------------------------- Left ventricle: The cavity size was normal. Systolic function was normal. The estimated ejection fraction was in the range of 60% to 65%. Wall motion was normal; there were no regional wall motion abnormalities. Doppler parameters are consistent with abnormal left ventricular relaxation (grade 1 diastolic dysfunction).  ------------------------------------------------------------------- Aortic valve: Trileaflet; moderately thickened leaflets with 1cm x 1cm mass on the right coronary cusp, mild mobility, suggestive of vegetation. If no signs of infection, consider fibroelastoma. Mobility was not restricted. Doppler: There was severe stenosis. There was no regurgitation. VTI ratio of LVOT to aortic valve: 0.42. Valve area (VTI): 1.32 cm^2. Indexed valve area (VTI): 0.74 cm^2/m^2. Peak velocity ratio of LVOT to aortic valve: 0.45. Valve area (Vmax): 1.41 cm^2. Indexed valve area (Vmax): 0.79 cm^2/m^2. Mean velocity ratio of LVOT to aortic valve: 0.41. Valve area (Vmean): 1.28 cm^2. Indexed valve area (Vmean): 0.72 cm^2/m^2. Mean gradient (S): 15 mm Hg. Peak gradient (S): 24 mm Hg.  ------------------------------------------------------------------- Aorta: Aortic root: The aortic root was normal in size.  -------------------------------------------------------------------  Mitral valve: Structurally normal valve. Mobility was not restricted. Doppler: Transvalvular velocity was within the normal range. There was no evidence for stenosis. There was no regurgitation. Peak gradient (D):  8 mm Hg.  ------------------------------------------------------------------- Left atrium: The atrium was normal in size.  ------------------------------------------------------------------- Right ventricle: The cavity size was normal. Wall thickness was normal. Systolic function was normal.  TEE  05/25/2017 Study Conclusions  - Left ventricle: The cavity size was normal. Systolic function was normal. The estimated ejection fraction was in the range of 60% to 65%. Wall motion was normal; there were no regional wall motion abnormalities. Doppler parameters are consistent with abnormal left ventricular relaxation (grade 1 diastolic dysfunction). - Aortic valve: Trileaflet; moderately thickened leaflets with 1cm x 1cm mass on the right coronary cusp, mild mobility, suggestive of vegetation. If no signs of infection, consider fibroelastoma. There was severe stenosis. Valve area (VTI): 1.32 cm^2. Valve area (Vmax): 1.41 cm^2. Valve area (Vmean): 1.28 cm^2.  ------------------------------------------------------------------- Study data: No prior study was available for comparison. Study status: Routine. Procedure: The patient reported no Shaffer pre or post test. Transthoracic echocardiography. Image quality was adequate. Study completion: There were no complications. Transthoracic echocardiography. M-mode, complete 2D, spectral Doppler, and color Doppler. Birthdate: Patient birthdate: [birth year]. Age: Patient is 42 yr old. Sex: Gender: female. BMI: 25 kg/m^2. Blood pressure: 120/54 Patient status: Inpatient. Study date: Study date: 05/25/2017. Study time: 09:48 AM. Location: Bedside.  -------------------------------------------------------------------  ------------------------------------------------------------------- Left ventricle: The cavity size was normal. Systolic function was normal. The estimated ejection fraction was in the  range of 60% to 65%. Wall motion was normal; there were no regional wall motion abnormalities. Doppler parameters are consistent with abnormal left ventricular relaxation (grade 1 diastolic dysfunction).  ------------------------------------------------------------------- Aortic valve: Trileaflet; moderately thickened leaflets with 1cm x 1cm mass on the right coronary cusp, mild mobility, suggestive of vegetation. If no signs of infection, consider fibroelastoma. Mobility was not restricted. Doppler: There was severe stenosis. There was no regurgitation. VTI ratio of LVOT to aortic valve: 0.42. Valve area (VTI): 1.32 cm^2. Indexed valve area (VTI): 0.74 cm^2/m^2. Peak velocity ratio of LVOT to aortic valve: 0.45. Valve area (Vmax): 1.41 cm^2. Indexed valve area (Vmax): 0.79 cm^2/m^2. Mean velocity ratio of LVOT to aortic valve: 0.41. Valve area (Vmean): 1.28 cm^2. Indexed valve area (Vmean): 0.72 cm^2/m^2. Mean gradient (S): 15 mm Hg. Peak gradient (S): 24 mm Hg.  ------------------------------------------------------------------- Aorta: Aortic root: The aortic root was normal in size.  ------------------------------------------------------------------- Mitral valve: Structurally normal valve. Mobility was not restricted. Doppler: Transvalvular velocity was within the normal range. There was no evidence for stenosis. There was no regurgitation. Peak gradient (D): 8 mm Hg.  ------------------------------------------------------------------- Left atrium: The atrium was normal in size.  ------------------------------------------------------------------- Right ventricle: The cavity size was normal. Wall thickness was normal. Systolic function was normal.    ASSESSMENT AND PLAN:  1.  AV endocarditis with a bicuspid AV. Severe AI.  S/P aortic valve replacement  with a 21 mm mechanical valve -[employer] serial number [id number] by Dr. Nils Pyle. This  was completed in the setting of bacterial endocarditis and vegetation on a bicuspid valve. We will arrange for follow up Echo. Discussed SBE prophylaxis. Need for lifelong coumadin therapy. Will check INR today.    2. Chronic periodontal disease: She will need to be followed by dentist. With any further procedures or extractions, she is to have SBE prophylaxis.   3. HTN poorly controlled. Go ahead and start lisinopril 10 mg daily in addition to  beta blocker.    Tasmin Exantus Martinique MD, [facility]   09/26/2017 1:00 PM    [place]

## 2017-09-26 ENCOUNTER — Ambulatory Visit (INDEPENDENT_AMBULATORY_CARE_PROVIDER_SITE_OTHER): Payer: 59 | Admitting: Pharmacist

## 2017-09-26 ENCOUNTER — Encounter: Payer: Self-pay | Admitting: Cardiology

## 2017-09-26 ENCOUNTER — Ambulatory Visit (INDEPENDENT_AMBULATORY_CARE_PROVIDER_SITE_OTHER): Payer: 59 | Admitting: Cardiology

## 2017-09-26 VITALS — BP 160/100 | HR 82 | Ht 64.0 in | Wt 156.8 lb

## 2017-09-26 DIAGNOSIS — Z5181 Encounter for therapeutic drug level monitoring: Secondary | ICD-10-CM | POA: Diagnosis not present

## 2017-09-26 DIAGNOSIS — Z952 Presence of prosthetic heart valve: Secondary | ICD-10-CM | POA: Diagnosis not present

## 2017-09-26 DIAGNOSIS — I10 Essential (primary) hypertension: Secondary | ICD-10-CM | POA: Diagnosis not present

## 2017-09-26 DIAGNOSIS — Q231 Congenital insufficiency of aortic valve: Secondary | ICD-10-CM | POA: Diagnosis not present

## 2017-09-26 DIAGNOSIS — I39 Endocarditis and heart valve disorders in diseases classified elsewhere: Secondary | ICD-10-CM | POA: Diagnosis not present

## 2017-09-26 DIAGNOSIS — Q230 Congenital stenosis of aortic valve: Secondary | ICD-10-CM

## 2017-09-26 LAB — POCT INR: INR: 1.4

## 2017-09-26 MED ORDER — LISINOPRIL 10 MG PO TABS: 10.0000 mg | ORAL_TABLET | Freq: Every day | ORAL | 3 refills | Status: DC

## 2017-09-26 NOTE — Patient Instructions (Signed)
Add lisinopril 10 mg daily for blood pressure  We will schedule you for an Echocardiogram  I will see you in 6 months.

## 2017-10-04 ENCOUNTER — Ambulatory Visit (HOSPITAL_COMMUNITY): Payer: 59 | Attending: Cardiology

## 2017-10-04 ENCOUNTER — Other Ambulatory Visit (HOSPITAL_COMMUNITY): Payer: 59

## 2017-10-04 ENCOUNTER — Other Ambulatory Visit: Payer: Self-pay

## 2017-10-04 DIAGNOSIS — I39 Endocarditis and heart valve disorders in diseases classified elsewhere: Secondary | ICD-10-CM | POA: Diagnosis not present

## 2017-10-04 DIAGNOSIS — Q230 Congenital stenosis of aortic valve: Secondary | ICD-10-CM

## 2017-10-04 DIAGNOSIS — I351 Nonrheumatic aortic (valve) insufficiency: Secondary | ICD-10-CM | POA: Insufficient documentation

## 2017-10-04 DIAGNOSIS — Q231 Congenital insufficiency of aortic valve: Secondary | ICD-10-CM | POA: Diagnosis present

## 2017-10-04 DIAGNOSIS — I10 Essential (primary) hypertension: Secondary | ICD-10-CM

## 2017-10-04 DIAGNOSIS — Z952 Presence of prosthetic heart valve: Secondary | ICD-10-CM | POA: Diagnosis not present

## 2017-10-04 DIAGNOSIS — I350 Nonrheumatic aortic (valve) stenosis: Secondary | ICD-10-CM

## 2017-10-16 ENCOUNTER — Ambulatory Visit (INDEPENDENT_AMBULATORY_CARE_PROVIDER_SITE_OTHER): Payer: 59 | Admitting: Pharmacist Clinician (PhC)/ Clinical Pharmacy Specialist

## 2017-10-16 DIAGNOSIS — Z5181 Encounter for therapeutic drug level monitoring: Secondary | ICD-10-CM | POA: Diagnosis not present

## 2017-10-16 DIAGNOSIS — Z952 Presence of prosthetic heart valve: Secondary | ICD-10-CM

## 2017-10-16 LAB — POCT INR: INR: 2.7

## 2017-10-16 NOTE — Patient Instructions (Signed)
Description   Continue with 1 tablet daily except 1/2 tablet each  Tuesday, and Thursday.  Repeat INR in 2 weeks

## 2017-10-17 ENCOUNTER — Ambulatory Visit (INDEPENDENT_AMBULATORY_CARE_PROVIDER_SITE_OTHER): Payer: 59 | Admitting: Cardiothoracic Surgery

## 2017-10-17 ENCOUNTER — Encounter: Payer: Self-pay | Admitting: Cardiothoracic Surgery

## 2017-10-17 VITALS — BP 160/100 | HR 68 | Resp 20 | Ht 64.0 in | Wt 156.0 lb

## 2017-10-17 DIAGNOSIS — Z952 Presence of prosthetic heart valve: Secondary | ICD-10-CM | POA: Diagnosis not present

## 2017-10-17 DIAGNOSIS — I358 Other nonrheumatic aortic valve disorders: Secondary | ICD-10-CM

## 2017-10-17 NOTE — Progress Notes (Signed)
PCP is Seward Carol, MD Referring Provider is Martinique, Fidel Caggiano M, MD  Chief Complaint  Patient presents with  . Routine Post Op    1 month f/u discuss return to work and BP check, HX of AVR    HPI: Patient returns for follow-up 4 months after AVR for endocarditis. Cardiac status is stable.  She is taking Coumadin for a mechanical valve.  She is in sinus rhythm.  Surgical incision is healed.  She denies symptoms of orthopnea PND edema.  She tries to ambulate daily.  She complains mainly of insomnia, emotional lability and feels that something is not right.  She is always fatigued.  Her blood pressure medication has been recently increased for hypertension and today her blood pressure remains elevated.  She has not yet started up medication change by her primary care physician.  Her primary care physician has also started her on Remeron.  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 Cystitis 05/2017  . Family history of adverse reaction to anesthesia    " mother has a hard time waking "  . Migraine   . Migraines     Past Surgical History:  Procedure Laterality Date  . AORTIC VALVE REPLACEMENT N/A 06/04/2017   Procedure: AORTIC VALVE REPLACEMENT (AVR);  Surgeon: Prescott Gum, Collier Salina, MD;  Location: [place];  Service: Open Heart Surgery;  Laterality: N/A;  . MULTIPLE EXTRACTIONS WITH ALVEOLOPLASTY N/A 05/31/2017   Procedure: Extraction of tooth  #'s [phone] with alveoloplasty and gross debridement of remaining dentition;  Surgeon: Lenn Cal, DDS;  Location: [place];  Service: Oral Surgery;  Laterality: N/A;  . TEE WITHOUT CARDIOVERSION N/A 05/29/2017   Procedure: TRANSESOPHAGEAL ECHOCARDIOGRAM (TEE);  Surgeon: Jerline Pain, MD;  Location: [facility] ENDOSCOPY;  Service: Cardiovascular;  Laterality: N/A;  . TEE WITHOUT CARDIOVERSION  N/A 06/04/2017   Procedure: TRANSESOPHAGEAL ECHOCARDIOGRAM (TEE);  Surgeon: Prescott Gum, Collier Salina, MD;  Location: [place];  Service: Open Heart Surgery;  Laterality: N/A;  . WISDOM TOOTH EXTRACTION      Family History  Problem Relation Age of Onset  . Obesity Mother   . Hypertension Sister     Social History Social History   Tobacco Use  . Smoking status: Never Smoker  . Smokeless tobacco: Never Used  Substance Use Topics  . Alcohol use: No  . Drug use: No    Current Outpatient Medications  Medication Sig Dispense Refill  . aspirin EC 81 MG EC tablet Take 1 tablet (81 mg total) by mouth daily.    Marland Kitchen lisinopril (PRINIVIL,ZESTRIL) 10 MG tablet Take 1 tablet (10 mg total) by mouth daily. 90 tablet 3  . medroxyPROGESTERone (DEPO-PROVERA) 150 MG/ML injection Inject 150 mg into the muscle every 3 (three) months.    . metoprolol tartrate (LOPRESSOR) 25 MG tablet Take 1 tablet (25 mg total) by mouth 2 (two) times daily. 60 tablet 4  . warfarin (COUMADIN) 5 MG tablet Take 1 tablet (5 mg total) by mouth daily at 6 PM. As directed by the coumadin clinic 100 tablet 1   No current facility-administered medications for this visit.     No Known Allergies  Review of Systems  Weight stable no recent dental work No bleeding complications from Coumadin INR checked every 2 weeks  BP (!) 160/100   Pulse 68  Resp 20   Ht 5\' 4"  (1.626 m)   Wt 156 lb (70.8 kg)   SpO2 99% Comment: RA  BMI 26.78 kg/m  Physical Exam Anxious distressed tearful Lungs clear Heart rhythm regular normal aortic valve mechanical sounds without AI Abdomen soft Neuro intact  Diagnostic Tests: None  Impression: Cardiac status stable after AVR for severe AI and endocarditis.  No evidence of recurrent valve disease.  Patient still at a low functional status not ready to return to work.  We will assess the patient again in 1 month.  I believe her high blood pressure is probably the reason for her symptoms as well as  postoperative depression and possibly early menopause  Plan: Return to office in order to assess return to work status in 1 month   Len Childs, MD [facility] [phone]

## 2017-11-21 ENCOUNTER — Encounter: Payer: Self-pay | Admitting: Cardiothoracic Surgery

## 2017-11-21 ENCOUNTER — Ambulatory Visit (INDEPENDENT_AMBULATORY_CARE_PROVIDER_SITE_OTHER): Payer: 59 | Admitting: Cardiothoracic Surgery

## 2017-11-21 ENCOUNTER — Other Ambulatory Visit: Payer: Self-pay

## 2017-11-21 VITALS — BP 144/102 | HR 73 | Resp 16 | Ht 64.0 in | Wt 162.4 lb

## 2017-11-21 DIAGNOSIS — Z952 Presence of prosthetic heart valve: Secondary | ICD-10-CM | POA: Diagnosis not present

## 2017-11-21 NOTE — Progress Notes (Signed)
PCP is Seward Carol, MD Referring Provider is Martinique, Peter M, MD  Chief Complaint  Patient presents with  . Routine Post Op    1 month f/u s/p  AVR  06/04/17...to  assess RTW status....    HPI: Patient returns for one-month follow-up.  She had a mechanical AVR performed 6 months ago for severe AI from endocarditis.  She had dental extractions performed preop AVR She has had prolonged recovery due to stress emotional lability depression and having a difficult time accepting her decline in health at an early age.  She denies any cardiac symptoms.  She is taking her cardiac medications now including her blood pressure medicine.  She denies any bleeding complications from Coumadin. She clearly understands importance of antibiotic prophylaxis prior to any future dental procedures in the confirmed that she has the prescription for amoxicillin that I gave her earlier this year.  She feels she is now compared to return to work emotionally and physically and wishes to return soon.  I feel this is appropriate and have provided her with a return to work document as of June 17.   Past Medical History:  Diagnosis Date  . Cystitis 05/2017  . Family history of adverse reaction to anesthesia    " mother has a hard time waking "  . Migraine   . Migraines     Past Surgical History:  Procedure Laterality Date  . AORTIC VALVE REPLACEMENT N/A 06/04/2017   Procedure: AORTIC VALVE REPLACEMENT (AVR);  Surgeon: Prescott Gum, Collier Salina, MD;  Location: [place];  Service: Open Heart Surgery;  Laterality: N/A;  . MULTIPLE EXTRACTIONS WITH ALVEOLOPLASTY N/A 05/31/2017   Procedure: Extraction of tooth  #'s [phone] with alveoloplasty and gross debridement of remaining dentition;  Surgeon: Lenn Cal, DDS;  Location: [place];  Service: Oral Surgery;  Laterality: N/A;  . TEE WITHOUT CARDIOVERSION N/A 05/29/2017   Procedure: TRANSESOPHAGEAL ECHOCARDIOGRAM (TEE);  Surgeon: Jerline Pain, MD;  Location: [facility]  ENDOSCOPY;  Service: Cardiovascular;  Laterality: N/A;  . TEE WITHOUT CARDIOVERSION N/A 06/04/2017   Procedure: TRANSESOPHAGEAL ECHOCARDIOGRAM (TEE);  Surgeon: Prescott Gum, Collier Salina, MD;  Location: [place];  Service: Open Heart Surgery;  Laterality: N/A;  . WISDOM TOOTH EXTRACTION      Family History  Problem Relation Age of Onset  . Obesity Mother   . Hypertension Sister     Social History Social History   Tobacco Use  . Smoking status: Never Smoker  . Smokeless tobacco: Never Used  Substance Use Topics  . Alcohol use: No  . Drug use: No    Current Outpatient Medications  Medication Sig Dispense Refill  . aspirin EC 81 MG EC tablet Take 1 tablet (81 mg total) by mouth daily.    Marland Kitchen lisinopril (PRINIVIL,ZESTRIL) 10 MG tablet Take 1 tablet (10 mg total) by mouth daily. 90 tablet 3  . medroxyPROGESTERone (DEPO-PROVERA) 150 MG/ML injection Inject 150 mg into the muscle every 3 (three) months.    . metoprolol tartrate (LOPRESSOR) 25 MG tablet Take 1 tablet (25 mg total) by mouth 2 (two) times daily. 60 tablet 4  . warfarin (COUMADIN) 5 MG tablet Take 1 tablet (5 mg total) by mouth daily at 6 PM. As directed by the coumadin clinic 100 tablet 1   No current facility-administered medications for this visit.     No Known Allergies  Review of Systems  No  no complaints  BP (!) 144/102 (BP Location: Right Arm, Patient Position: Sitting, Cuff Size: Normal)  Pulse 73   Resp 16   Ht 5\' 4"  (1.626 m)   Wt 162 lb 6.4 oz (73.7 kg)   SpO2 99% Comment: ON RA  BMI 27.88 kg/m  Physical Exam      Exam    General- alert and comfortable    Neck- no JVD, no cervical adenopathy palpable, no carotid bruit   Lungs- clear without rales, wheezes   Cor- regular rate and rhythm, no murmur , gallop   Abdomen- soft, non-tender   Extremities - warm, non-tender, minimal edema   Neuro- oriented, appropriate, no focal weakness   Diagnostic Tests:  None Impression: Patient now much more emotionally  stable and physically fit to return to work normal shift.  Restrictions for lifting or now lifted.  Plan: Return in 3 months to assess progress, to check her blood pressure, and to review the postop echo which has been planned by the cardiology  office.  Len Childs, MD [facility] [phone]

## 2017-12-12 ENCOUNTER — Encounter (HOSPITAL_COMMUNITY): Payer: Self-pay | Admitting: Emergency Medicine

## 2017-12-12 ENCOUNTER — Other Ambulatory Visit: Payer: Self-pay

## 2017-12-12 ENCOUNTER — Emergency Department (HOSPITAL_COMMUNITY): Payer: 59

## 2017-12-12 ENCOUNTER — Emergency Department (HOSPITAL_COMMUNITY)
Admission: EM | Admit: 2017-12-12 | Discharge: 2017-12-13 | Disposition: A | Discharge status: Home / Self Care | Payer: 59 | Attending: Emergency Medicine | Admitting: Emergency Medicine

## 2017-12-12 DIAGNOSIS — Z7901 Long term (current) use of anticoagulants: Secondary | ICD-10-CM | POA: Diagnosis not present

## 2017-12-12 DIAGNOSIS — R0789 Other chest pain: Secondary | ICD-10-CM | POA: Diagnosis present

## 2017-12-12 DIAGNOSIS — Z952 Presence of prosthetic heart valve: Secondary | ICD-10-CM | POA: Insufficient documentation

## 2017-12-12 DIAGNOSIS — Z79899 Other long term (current) drug therapy: Secondary | ICD-10-CM | POA: Diagnosis not present

## 2017-12-12 DIAGNOSIS — M7918 Myalgia, other site: Secondary | ICD-10-CM | POA: Insufficient documentation

## 2017-12-12 DIAGNOSIS — W19XXXA Unspecified fall, initial encounter: Secondary | ICD-10-CM | POA: Insufficient documentation

## 2017-12-12 DIAGNOSIS — Z7982 Long term (current) use of aspirin: Secondary | ICD-10-CM | POA: Diagnosis not present

## 2017-12-12 LAB — I-STAT BETA HCG BLOOD, ED (MC, WL, AP ONLY)

## 2017-12-12 LAB — BASIC METABOLIC PANEL
Anion gap: 6 (ref 5–15)
BUN: 11 mg/dL (ref 6–20)
CHLORIDE: 105 mmol/L (ref 98–111)
CO2: 25 mmol/L (ref 22–32)
CREATININE: 0.85 mg/dL (ref 0.44–1.00)
Calcium: 8.5 mg/dL — ABNORMAL LOW (ref 8.9–10.3)
GFR calc non Af Amer: >60 mL/min (ref >60)
Glucose, Bld: 122 mg/dL — ABNORMAL HIGH (ref 70–99)
Potassium: 3.1 mmol/L — ABNORMAL LOW (ref 3.5–5.1)
SODIUM: 136 mmol/L (ref 135–145)

## 2017-12-12 LAB — PROTIME-INR
INR: 1.38
PROTHROMBIN TIME: 16.9 s — AB (ref 11.4–15.2)

## 2017-12-12 LAB — I-STAT TROPONIN, ED: Troponin i, poc: 0 ng/mL (ref 0.00–0.08)

## 2017-12-12 LAB — CBC
HCT: 35.6 % — ABNORMAL LOW (ref 36.0–46.0)
HEMOGLOBIN: 10.9 g/dL — AB (ref 12.0–15.0)
MCH: 27.2 pg (ref 26.0–34.0)
MCHC: 30.6 g/dL (ref 30.0–36.0)
MCV: 88.8 fL (ref 78.0–100.0)
Platelets: 283 10*3/uL (ref 150–400)
RBC: 4.01 MIL/uL (ref 3.87–5.11)
RDW: 12.9 % (ref 11.5–15.5)
WBC: 7.8 10*3/uL (ref 4.0–10.5)

## 2017-12-12 LAB — D-DIMER, QUANTITATIVE (NOT AT ARMC): D DIMER QUANT: 1.05 ug{FEU}/mL — AB (ref 0.00–0.50)

## 2017-12-12 MED ORDER — IOPAMIDOL (ISOVUE-370) INJECTION 76%
INTRAVENOUS | Status: AC
  Filled 2017-12-12: qty 100

## 2017-12-12 MED ORDER — IOPAMIDOL (ISOVUE-370) INJECTION 76%
100.0000 mL | Freq: Once | INTRAVENOUS | Status: AC | PRN
  Administered 2017-12-12: 54 mL via INTRAVENOUS

## 2017-12-12 MED ORDER — METHOCARBAMOL 500 MG PO TABS
500.0000 mg | ORAL_TABLET | Freq: Once | ORAL | Status: AC
  Administered 2017-12-12: 500 mg via ORAL
  Filled 2017-12-12: qty 1

## 2017-12-12 NOTE — ED Provider Notes (Signed)
[facility] EMERGENCY DEPARTMENT Provider Note   CSN: [id number] Arrival date & time: 12/12/17  1903     History   Chief Complaint Chief Complaint  Patient presents with  . Fall    HPI Krystal Shaffer is a 42 y.o. female presenting for evaluation of chest tightness, right shoulder pain, and a fall.  Patient states she had aortic valve replacement surgery December 2018.  She was placed on Coumadin, which she has been taking as prescribed.  For the past 2 weeks, she has been having chest pressure/tightness when she takes a deep breath in.  Additionally, she is having right sided posterior shoulder pain.  This began after she moved in with her mom and was sleeping on a new bed/couch.  Yesterday, she had sharp pain through her right shoulder, causing her to fall.  She hit the back of her head, did not lose consciousness.  She denies headache, vision changes, slurred speech, numbness, tingling, nausea, or vomiting since.  She denies any other medical problems, is on Coumadin and lisinopril.  She denies recent fevers, chills, cough, sore throat, nausea, vomiting, abdominal pain, urinary symptoms, abnormal bowel movements.  She denies leg pain or swelling.  She denies recent travel, immobilization, history of cancer, history of previous DVT, or OCP use.  HPI  Past Medical History:  Diagnosis Date  . Cystitis 05/2017  . Family history of adverse reaction to anesthesia    " mother has a hard time waking "  . Migraine   . Migraines     Patient Active Problem List   Diagnosis Date Noted  . Encounter for therapeutic drug monitoring 06/11/2017  . Aortic valve replaced 06/11/2017  . Endocarditis 06/04/2017  . Right-sided chest pain   . Bacteremia due to Streptococcus 05/26/2017  . Aortic valve endocarditis 05/26/2017  . Diastolic murmur 93/90/3009  . Bacterial vaginosis 05/26/2017  . Atypical chest pain 05/26/2017  . Temporal headache 05/26/2017  . Streptococcal sepsis  ([place]) 05/26/2017  . Sepsis ([place]) 05/24/2017  . Acute pyelonephritis 05/18/2017  . Back pain 05/18/2017  . Nausea and vomiting 05/18/2017  . UTI (urinary tract infection) 05/18/2017    Past Surgical History:  Procedure Laterality Date  . AORTIC VALVE REPLACEMENT N/A 06/04/2017   Procedure: AORTIC VALVE REPLACEMENT (AVR);  Surgeon: Prescott Gum, Collier Salina, MD;  Location: [place];  Service: Open Heart Surgery;  Laterality: N/A;  . MULTIPLE EXTRACTIONS WITH ALVEOLOPLASTY N/A 05/31/2017   Procedure: Extraction of tooth  #'s [phone] with alveoloplasty and gross debridement of remaining dentition;  Surgeon: Lenn Cal, DDS;  Location: [place];  Service: Oral Surgery;  Laterality: N/A;  . TEE WITHOUT CARDIOVERSION N/A 05/29/2017   Procedure: TRANSESOPHAGEAL ECHOCARDIOGRAM (TEE);  Surgeon: Jerline Pain, MD;  Location: [facility] ENDOSCOPY;  Service: Cardiovascular;  Laterality: N/A;  . TEE WITHOUT CARDIOVERSION N/A 06/04/2017   Procedure: TRANSESOPHAGEAL ECHOCARDIOGRAM (TEE);  Surgeon: Prescott Gum, Collier Salina, MD;  Location: [place];  Service: Open Heart Surgery;  Laterality: N/A;  . WISDOM TOOTH EXTRACTION       OB History   None      Home Medications    Prior to Admission medications   Medication Sig Start Date End Date Taking? Authorizing Provider  aspirin EC 81 MG EC tablet Take 1 tablet (81 mg total) by mouth daily. 06/10/17  Yes Gold, Wilder Glade, PA-C  aspirin-acetaminophen-caffeine (EXCEDRIN EXTRA STRENGTH) [phone] MG tablet Take 1 tablet by mouth every 6 (six) hours as needed for headache.  Yes [provider]  ibuprofen (ADVIL,MOTRIN) 200 MG tablet Take 800 mg by mouth every 6 (six) hours as needed for moderate pain.   Yes [provider]  lisinopril-hydrochlorothiazide (PRINZIDE,ZESTORETIC) 20-12.5 MG tablet Take 1 tablet by mouth daily. 12/06/17  Yes [provider]  medroxyPROGESTERone (DEPO-PROVERA) 150 MG/ML injection Inject 150 mg into the muscle every 3 (three)  months.   Yes [provider]  metoprolol tartrate (LOPRESSOR) 25 MG tablet Take 1 tablet (25 mg total) by mouth 2 (two) times daily. 08/14/17  Yes Martinique, Peter M, MD  mirtazapine (REMERON) 15 MG tablet Take 15 mg by mouth at bedtime.   Yes [provider]  warfarin (COUMADIN) 5 MG tablet Take 1 tablet (5 mg total) by mouth daily at 6 PM. As directed by the coumadin clinic 06/09/17  Yes Gold, Wayne E, PA-C  lisinopril (PRINIVIL,ZESTRIL) 10 MG tablet Take 1 tablet (10 mg total) by mouth daily. Patient not taking: Reported on 12/12/2017 09/26/17 09/21/18  Martinique, Peter M, MD    Family History Family History  Problem Relation Age of Onset  . Obesity Mother   . Hypertension Sister     Social History Social History   Tobacco Use  . Smoking status: Never Smoker  . Smokeless tobacco: Never Used  Substance Use Topics  . Alcohol use: No  . Drug use: No     Allergies   Patient has no known allergies.   Review of Systems Review of Systems  Cardiovascular: Positive for chest pain.  Musculoskeletal: Positive for myalgias.  Hematological: Bruises/bleeds easily.  All other systems reviewed and are negative.    Physical Exam Updated Vital Signs BP 113/74 (BP Location: Right Arm)   Pulse 88   Temp 98.4 F (36.9 C) (Oral)   Resp 16   Ht 5\' 4"  (1.626 m)   Wt 74.8 kg (165 lb)   SpO2 99%   BMI 28.32 kg/m   Physical Exam  Constitutional: She is oriented to person, place, and time. She appears well-developed and well-nourished. No distress.  Appears in no distress  HENT:  Head: Normocephalic and atraumatic.  Eyes: Pupils are equal, round, and reactive to light. Conjunctivae and EOM are normal.  Neck: Normal range of motion. Neck supple.  Cardiovascular: Normal rate, regular rhythm and intact distal pulses.  Pulmonary/Chest: Effort normal and breath sounds normal. No respiratory distress. She has no wheezes. She exhibits tenderness.  Tenderness palpation of anterior  chest wall.  Speaking in short sentences.  Clear lung sounds in all fields.  Abdominal: Soft. She exhibits no distension and no mass. There is no tenderness. There is no guarding.  Musculoskeletal: Normal range of motion. She exhibits tenderness. She exhibits no edema.  Tenderness palpation of right trapezius muscle.  No obvious deformity.  Grip strength intact bilaterally.  Sensation intact bilaterally.  Radial pulses intact bilaterally.  No erythema, warmth, or swelling.  Neurological: She is alert and oriented to person, place, and time. No sensory deficit.  Skin: Skin is warm and dry. Capillary refill takes less than 2 seconds.  Psychiatric: She has a normal mood and affect.  Nursing note and vitals reviewed.    ED Treatments / Results  Labs (all labs ordered are listed, but only abnormal results are displayed) Labs Reviewed  PROTIME-INR - Abnormal; Notable for the following components:      Result Value   Prothrombin Time 16.9 (*)    All other components within normal limits  CBC - Abnormal; Notable for  the following components:   Hemoglobin 10.9 (*)    HCT 35.6 (*)    All other components within normal limits  BASIC METABOLIC PANEL - Abnormal; Notable for the following components:   Potassium 3.1 (*)    Glucose, Bld 122 (*)    Calcium 8.5 (*)    All other components within normal limits  D-DIMER, QUANTITATIVE (NOT AT [facility])  I-STAT BETA HCG BLOOD, ED ([facility], [facility], [facility] ONLY)  I-STAT TROPONIN, ED    EKG None  Radiology Dg Chest 2 View  Result Date: 12/12/2017 CLINICAL DATA:  Chest pain and shortness of breath. EXAM: CHEST - 2 VIEW COMPARISON:  07/18/2017 and prior radiographs FINDINGS: Cardiomegaly and aortic valve replacement again noted. There is no evidence of focal airspace disease, pulmonary edema, suspicious pulmonary nodule/mass, pleural effusion, or pneumothorax. No acute bony abnormalities are identified. IMPRESSION: Cardiomegaly without evidence of acute cardiopulmonary  disease. Electronically Signed   By: Margarette Canada M.D.   On: 12/12/2017 19:51   Dg Shoulder Right  Result Date: 12/12/2017 CLINICAL DATA:  Fall, right shoulder pain EXAM: RIGHT SHOULDER - 2+ VIEW COMPARISON:  None. FINDINGS: There is no evidence of fracture or dislocation. There is no evidence of arthropathy or other focal bone abnormality. Soft tissues are unremarkable. IMPRESSION: Negative. Electronically Signed   By: Rolm Baptise M.D.   On: 12/12/2017 19:50    Procedures Procedures (including critical care time)  Medications Ordered in ED Medications  methocarbamol (ROBAXIN) tablet 500 mg (has no administration in time range)     Initial Impression / Assessment and Plan / ED Course  I have reviewed the triage vital signs and the nursing notes.  Pertinent labs & imaging results that were available during my care of the patient were reviewed by me and considered in my medical decision making (see chart for details).     Patient presenting for evaluation of chest tightness, right shoulder pain, and a fall.  Physical exam reassuring, she is afebrile not tachycardic.  Appears nontoxic.  Pain is reproducible with palpation of the musculature.  Likely muscle pain/soreness.  However, considering the patient's recent history of aortic valve replacement and subtherapeutic mid levels, will obtain d-dimer to rule out PE.  Labs otherwise reassuring, hgb stable. X-rays viewed and interpreted by me, no fracture, dislocation, pneumothorax, effusions.  Stable cardiomegaly.  Discussed with patient, who is agreeable to plan.  Pt signed out to [employer], PA-C. Plan to d/c with MSK treatment if dimer negative. If positive, scan. F/u for coumadin management.   Final Clinical Impressions(s) / ED Diagnoses   Final diagnoses:  None    ED Discharge Orders    None       Franchot Heidelberg, PA-C 12/12/17 2219    Little, Wenda Overland, MD 12/13/17 2302

## 2017-12-12 NOTE — ED Notes (Signed)
ED Provider at bedside. 

## 2017-12-12 NOTE — ED Triage Notes (Signed)
Pt reports falling yesterday on a curb, states she missed the step. Reports no LOC but did hit her head. Reporting R shoulder pain. States pain is radiating into her chest and results in shortness of breath. Coumadin patient.

## 2017-12-12 NOTE — ED Provider Notes (Signed)
Patient placed in Quick Look pathway, seen and evaluated   Chief Complaint: fall  HPI:   Pt is a 42 y.o. female with a PMHx of aortic insufficiency from endocarditis s/p mechanical AVR on chronic coumadin, presenting today with c/o R neck/arm pain and R CP since "a few days ago", and SOB "because the pain takes my breath away"; yesterday she went to step up on a curb and missed a step and fell on concrete and hit her head. States the pain "made her buckle". No HA, no LOC.   ROS: CP, SOB   Physical Exam:  BP 113/74 (BP Location: Right Arm)   Pulse 88   Temp 98.4 F (36.9 C) (Oral)   Resp 16   SpO2 99%    Gen: No distress  Neuro: Awake and Alert  Skin: Warm    Focused Exam: diffuse R sided neck and anterior chest wall TTP. Heart: RRR, nl s1/s2, mechanical murmur best heard at upper sternal border, no rub/gallops, distal pulses intact. Lungs clear.     Initiation of care has begun. The patient has been counseled on the process, plan, and necessity for staying for the completion/evaluation, and the remainder of the medical screening examination     [address] [place], [place] 12/12/17 1918    Tegeler, Gwenyth Allegra, MD 12/13/17 [phone]

## 2017-12-13 NOTE — ED Provider Notes (Signed)
Patient signed out to me at shift change.  Patient has been having some right sided posterior shoulder pain.  This began after sleeping on a new couch in bed.  She has had a mechanical fall as well.  Plan at signout is to check d-dimer, which if positive, will check CT.  D-dimer is 1.05, will proceed with CT.  Discussed plan with patient.  CT is negative for PE.  There are postoperative changes which are seen, but none reflective of infection.  Patient's vital signs are stable.  She is in no acute distress.  Plan at signout is to treat for musculoskeletal pain if the work-up is negative.  Patient understands and agrees with this plan.  She is stable and ready for discharge.   Montine Circle, PA-C 12/13/17 0138    Little, Wenda Overland, MD 12/13/17 [phone]

## 2018-02-12 ENCOUNTER — Telehealth: Payer: Self-pay | Admitting: Cardiology

## 2018-02-12 MED ORDER — WARFARIN SODIUM 5 MG PO TABS: ORAL_TABLET | ORAL | 0 refills | Status: DC

## 2018-02-12 NOTE — Telephone Encounter (Signed)
INR visit scheduled for 9/4 at 3:15pm

## 2018-02-12 NOTE — Telephone Encounter (Signed)
New Message       *STAT* If patient is at the pharmacy, call can be transferred to refill team.   1. Which medications need to be refilled? (please list name of each medication and dose if known) Warfarin 5 mg  2. Which pharmacy/location (including street and city if local pharmacy) is medication to be sent to?   [facility] [place], [address] [phone] (Phone) [phone] (Fax)     Significant History/Details     3. Do they need a 30 day or 90 day supply? [place]

## 2018-02-13 ENCOUNTER — Ambulatory Visit (INDEPENDENT_AMBULATORY_CARE_PROVIDER_SITE_OTHER): Payer: 59 | Admitting: Pharmacist

## 2018-02-13 DIAGNOSIS — Z5181 Encounter for therapeutic drug level monitoring: Secondary | ICD-10-CM | POA: Diagnosis not present

## 2018-02-13 DIAGNOSIS — Z952 Presence of prosthetic heart valve: Secondary | ICD-10-CM | POA: Diagnosis not present

## 2018-02-13 LAB — POCT INR: INR: 1 — AB (ref 2.0–3.0)

## 2018-02-13 MED ORDER — WARFARIN SODIUM 5 MG PO TABS: ORAL_TABLET | ORAL | 0 refills | Status: DC

## 2018-02-15 ENCOUNTER — Emergency Department (HOSPITAL_COMMUNITY): Payer: 59

## 2018-02-15 ENCOUNTER — Inpatient Hospital Stay (HOSPITAL_COMMUNITY): Payer: 59

## 2018-02-15 ENCOUNTER — Encounter (HOSPITAL_COMMUNITY): Payer: Self-pay

## 2018-02-15 ENCOUNTER — Inpatient Hospital Stay (HOSPITAL_COMMUNITY)
Admission: EM | Admit: 2018-02-15 | Discharge: 2018-02-21 | DRG: 864 | Disposition: A | Discharge status: Home / Self Care | Payer: 59 | Attending: Internal Medicine | Admitting: Internal Medicine

## 2018-02-15 DIAGNOSIS — R079 Chest pain, unspecified: Secondary | ICD-10-CM

## 2018-02-15 DIAGNOSIS — Z5181 Encounter for therapeutic drug level monitoring: Secondary | ICD-10-CM | POA: Diagnosis not present

## 2018-02-15 DIAGNOSIS — G43919 Migraine, unspecified, intractable, without status migrainosus: Secondary | ICD-10-CM | POA: Diagnosis present

## 2018-02-15 DIAGNOSIS — R509 Fever, unspecified: Secondary | ICD-10-CM | POA: Diagnosis present

## 2018-02-15 DIAGNOSIS — I10 Essential (primary) hypertension: Secondary | ICD-10-CM | POA: Diagnosis present

## 2018-02-15 DIAGNOSIS — R6510 Systemic inflammatory response syndrome (SIRS) of non-infectious origin without acute organ dysfunction: Secondary | ICD-10-CM

## 2018-02-15 DIAGNOSIS — R5081 Fever presenting with conditions classified elsewhere: Secondary | ICD-10-CM | POA: Diagnosis not present

## 2018-02-15 DIAGNOSIS — R000 Tachycardia, unspecified: Secondary | ICD-10-CM | POA: Diagnosis not present

## 2018-02-15 DIAGNOSIS — E876 Hypokalemia: Secondary | ICD-10-CM | POA: Diagnosis present

## 2018-02-15 DIAGNOSIS — R791 Abnormal coagulation profile: Secondary | ICD-10-CM | POA: Diagnosis present

## 2018-02-15 DIAGNOSIS — Z7982 Long term (current) use of aspirin: Secondary | ICD-10-CM

## 2018-02-15 DIAGNOSIS — I340 Nonrheumatic mitral (valve) insufficiency: Secondary | ICD-10-CM | POA: Diagnosis not present

## 2018-02-15 DIAGNOSIS — Z79899 Other long term (current) drug therapy: Secondary | ICD-10-CM | POA: Diagnosis not present

## 2018-02-15 DIAGNOSIS — A419 Sepsis, unspecified organism: Secondary | ICD-10-CM

## 2018-02-15 DIAGNOSIS — Z7901 Long term (current) use of anticoagulants: Secondary | ICD-10-CM | POA: Diagnosis not present

## 2018-02-15 DIAGNOSIS — M546 Pain in thoracic spine: Secondary | ICD-10-CM | POA: Diagnosis present

## 2018-02-15 DIAGNOSIS — I351 Nonrheumatic aortic (valve) insufficiency: Secondary | ICD-10-CM | POA: Diagnosis not present

## 2018-02-15 DIAGNOSIS — G43819 Other migraine, intractable, without status migrainosus: Secondary | ICD-10-CM | POA: Diagnosis not present

## 2018-02-15 DIAGNOSIS — Z952 Presence of prosthetic heart valve: Secondary | ICD-10-CM | POA: Diagnosis not present

## 2018-02-15 DIAGNOSIS — M79609 Pain in unspecified limb: Secondary | ICD-10-CM | POA: Diagnosis not present

## 2018-02-15 DIAGNOSIS — R072 Precordial pain: Secondary | ICD-10-CM | POA: Diagnosis not present

## 2018-02-15 DIAGNOSIS — I5030 Unspecified diastolic (congestive) heart failure: Secondary | ICD-10-CM | POA: Diagnosis not present

## 2018-02-15 HISTORY — DX: Systemic inflammatory response syndrome (sirs) of non-infectious origin without acute organ dysfunction: R65.10

## 2018-02-15 HISTORY — DX: Endocarditis, valve unspecified: I38

## 2018-02-15 LAB — URINALYSIS, ROUTINE W REFLEX MICROSCOPIC
Bilirubin Urine: NEGATIVE
GLUCOSE, UA: NEGATIVE mg/dL
KETONES UR: NEGATIVE mg/dL
LEUKOCYTES UA: NEGATIVE
Nitrite: NEGATIVE
Protein, ur: NEGATIVE mg/dL
Specific Gravity, Urine: >1.046 — ABNORMAL HIGH (ref 1.005–1.030)
pH: 5 (ref 5.0–8.0)

## 2018-02-15 LAB — I-STAT TROPONIN, ED
Troponin i, poc: 0 ng/mL (ref 0.00–0.08)
Troponin i, poc: 0.02 ng/mL (ref 0.00–0.08)

## 2018-02-15 LAB — CBC
HEMATOCRIT: 41.1 % (ref 36.0–46.0)
Hemoglobin: 12.7 g/dL (ref 12.0–15.0)
MCH: 27.9 pg (ref 26.0–34.0)
MCHC: 30.9 g/dL (ref 30.0–36.0)
MCV: 90.3 fL (ref 78.0–100.0)
PLATELETS: 256 10*3/uL (ref 150–400)
RBC: 4.55 MIL/uL (ref 3.87–5.11)
RDW: 13.2 % (ref 11.5–15.5)
WBC: 8.2 10*3/uL (ref 4.0–10.5)

## 2018-02-15 LAB — BASIC METABOLIC PANEL
Anion gap: 9 (ref 5–15)
BUN: 13 mg/dL (ref 6–20)
CO2: 21 mmol/L — ABNORMAL LOW (ref 22–32)
Calcium: 8.9 mg/dL (ref 8.9–10.3)
Chloride: 110 mmol/L (ref 98–111)
Creatinine, Ser: 0.84 mg/dL (ref 0.44–1.00)
GFR calc Af Amer: >60 mL/min (ref >60)
GFR calc non Af Amer: >60 mL/min (ref >60)
Glucose, Bld: 101 mg/dL — ABNORMAL HIGH (ref 70–99)
POTASSIUM: 3.6 mmol/L (ref 3.5–5.1)
SODIUM: 140 mmol/L (ref 135–145)

## 2018-02-15 LAB — PROTIME-INR
INR: 1.11
PROTHROMBIN TIME: 14.2 s (ref 11.4–15.2)

## 2018-02-15 LAB — C-REACTIVE PROTEIN: CRP: 3.7 mg/dL — AB (ref <1.0)

## 2018-02-15 LAB — I-STAT CG4 LACTIC ACID, ED
Lactic Acid, Venous: 1.28 mmol/L (ref 0.5–1.9)
Lactic Acid, Venous: 1.6 mmol/L (ref 0.5–1.9)

## 2018-02-15 LAB — I-STAT BETA HCG BLOOD, ED (MC, WL, AP ONLY)

## 2018-02-15 LAB — SEDIMENTATION RATE: Sed Rate: 8 mm/hr (ref 0–22)

## 2018-02-15 LAB — D-DIMER, QUANTITATIVE: D-Dimer, Quant: 1.13 ug/mL-FEU — ABNORMAL HIGH (ref 0.00–0.50)

## 2018-02-15 MED ORDER — METOCLOPRAMIDE HCL 5 MG/ML IJ SOLN
10.0000 mg | Freq: Once | INTRAMUSCULAR | Status: AC
  Administered 2018-02-15: 10 mg via INTRAVENOUS
  Filled 2018-02-15: qty 2

## 2018-02-15 MED ORDER — VANCOMYCIN HCL IN DEXTROSE 1-5 GM/200ML-% IV SOLN
1000.0000 mg | Freq: Three times a day (TID) | INTRAVENOUS | Status: DC
  Administered 2018-02-16 – 2018-02-20 (×13): 1000 mg via INTRAVENOUS
  Filled 2018-02-15 (×14): qty 200

## 2018-02-15 MED ORDER — PIPERACILLIN-TAZOBACTAM 3.375 G IVPB
3.3750 g | Freq: Once | INTRAVENOUS | Status: AC
  Administered 2018-02-15: 3.375 g via INTRAVENOUS
  Filled 2018-02-15: qty 50

## 2018-02-15 MED ORDER — WARFARIN SODIUM 7.5 MG PO TABS
7.5000 mg | ORAL_TABLET | Freq: Once | ORAL | Status: AC
  Administered 2018-02-15: 7.5 mg via ORAL
  Filled 2018-02-15 (×2): qty 1

## 2018-02-15 MED ORDER — SODIUM CHLORIDE 0.9 % IV BOLUS
1000.0000 mL | Freq: Once | INTRAVENOUS | Status: AC
  Administered 2018-02-15: 1000 mL via INTRAVENOUS

## 2018-02-15 MED ORDER — SODIUM CHLORIDE 0.9 % IV SOLN
1.0000 g | Freq: Once | INTRAVENOUS | Status: AC
  Administered 2018-02-15: 1 g via INTRAVENOUS
  Filled 2018-02-15: qty 10

## 2018-02-15 MED ORDER — VANCOMYCIN HCL 10 G IV SOLR
1500.0000 mg | Freq: Once | INTRAVENOUS | Status: DC
  Filled 2018-02-15: qty 1500

## 2018-02-15 MED ORDER — METOPROLOL TARTRATE 25 MG PO TABS
25.0000 mg | ORAL_TABLET | Freq: Two times a day (BID) | ORAL | Status: DC
  Administered 2018-02-15 – 2018-02-21 (×12): 25 mg via ORAL
  Filled 2018-02-15 (×13): qty 1

## 2018-02-15 MED ORDER — ASPIRIN EC 81 MG PO TBEC
81.0000 mg | DELAYED_RELEASE_TABLET | Freq: Every day | ORAL | Status: DC
  Administered 2018-02-16 – 2018-02-21 (×6): 81 mg via ORAL
  Filled 2018-02-15 (×6): qty 1

## 2018-02-15 MED ORDER — LISINOPRIL-HYDROCHLOROTHIAZIDE 20-12.5 MG PO TABS: 1.0000 | ORAL_TABLET | Freq: Every day | ORAL | Status: DC

## 2018-02-15 MED ORDER — IOPAMIDOL (ISOVUE-370) INJECTION 76%
INTRAVENOUS | Status: AC
  Administered 2018-02-15: 65 mL via INTRAVENOUS
  Filled 2018-02-15: qty 100

## 2018-02-15 MED ORDER — ACETAMINOPHEN 500 MG PO TABS
1000.0000 mg | ORAL_TABLET | Freq: Once | ORAL | Status: AC
  Administered 2018-02-15: 1000 mg via ORAL
  Filled 2018-02-15: qty 2

## 2018-02-15 MED ORDER — WARFARIN - PHARMACIST DOSING INPATIENT
Freq: Every day | Status: DC
  Administered 2018-02-17 – 2018-02-19 (×2)

## 2018-02-15 MED ORDER — GADOBUTROL 1 MMOL/ML IV SOLN
8.0000 mL | Freq: Once | INTRAVENOUS | Status: AC | PRN
  Administered 2018-02-15: 8 mL via INTRAVENOUS

## 2018-02-15 MED ORDER — MIRTAZAPINE 7.5 MG PO TABS: 30.0000 mg | ORAL_TABLET | Freq: Every evening | ORAL | Status: DC | PRN

## 2018-02-15 MED ORDER — HYDROCHLOROTHIAZIDE 12.5 MG PO CAPS
12.5000 mg | ORAL_CAPSULE | Freq: Every day | ORAL | Status: DC
  Administered 2018-02-16 – 2018-02-21 (×6): 12.5 mg via ORAL
  Filled 2018-02-15 (×6): qty 1

## 2018-02-15 MED ORDER — LISINOPRIL 20 MG PO TABS
20.0000 mg | ORAL_TABLET | Freq: Every day | ORAL | Status: DC
  Administered 2018-02-16 – 2018-02-21 (×6): 20 mg via ORAL
  Filled 2018-02-15 (×6): qty 1

## 2018-02-15 MED ORDER — MORPHINE SULFATE (PF) 2 MG/ML IV SOLN
0.5000 mg | Freq: Once | INTRAVENOUS | Status: AC
  Administered 2018-02-15: 0.5 mg via INTRAVENOUS
  Filled 2018-02-15: qty 1

## 2018-02-15 MED ORDER — ACETAMINOPHEN 325 MG PO TABS
650.0000 mg | ORAL_TABLET | Freq: Four times a day (QID) | ORAL | Status: DC | PRN
  Administered 2018-02-15: 650 mg via ORAL
  Filled 2018-02-15 (×2): qty 2

## 2018-02-15 NOTE — Progress Notes (Signed)
ANTICOAGULATION CONSULT NOTE - Initial Consult  Pharmacy Consult for Coumadin Indication: Mechanical valve  No Known Allergies  Patient Measurements: Height: 5\' 4"  (162.6 cm) Weight: 177 lb (80.3 kg) IBW/kg (Calculated) : 54.7 Heparin Dosing Weight: 72 kg  Vital Signs: Temp: 101.8 F (38.8 C) (09/06 1519) Temp Source: Oral (09/06 1519) BP: 121/75 (09/06 1940) Pulse Rate: 112 (09/06 1940)  Labs: Recent Labs    02/13/18 1531 02/15/18 1214  HGB  --  12.7  HCT  --  41.1  PLT  --  256  LABPROT  --  14.2  INR 1.0* 1.11  CREATININE  --  0.84    Estimated Creatinine Clearance: 91.2 mL/min (by C-G formula based on SCr of 0.84 mg/dL).   Medical History: Past Medical History:  Diagnosis Date  . Cystitis 05/2017  . Endocarditis   . Family history of adverse reaction to anesthesia    " mother has a hard time waking "  . Migraine   . Migraines    Assessment: 42 yo F presents with CP. PMH of mechanical AVR on Coumadin 5mg  daily exc for 2.5mg  on Tue/Thurs. Hx of noncompliance. INR low at 1.11. CBC stable.  Goal of Therapy:  INR 2-3 Monitor platelets by anticoagulation protocol: Yes   Plan:  Give Coumadin 7.5mg  PO x 1 Monitor daily INR, CBC, s/s of bleed  Consider bridging with heparin?  Reginia Naas 02/15/2018,9:36 PM

## 2018-02-15 NOTE — Progress Notes (Signed)
Pharmacy Antibiotic Note  Krystal Shaffer is a 42 y.o. female admitted on 02/15/2018 with fever of unknown origin.  Pharmacy has been consulted for vancomycin dosing. Tm 101.8. 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   WBC 8.2. LA 1.28, SCr 0.84, CrCl ~ 100-110 mL/min.   Plan: -Vancomycin 1500 mg IV once, then start vancomycin 1 gm IV Q 8 hours  -F/u maintenance gram negative coverage  -VT at SS   Height: 5\' 4"  (162.6 cm) Weight: 177 lb (80.3 kg) IBW/kg (Calculated) : 54.7  Temp (24hrs), Avg:101.8 F (38.8 C), Min:101.8 F (38.8 C), Max:101.8 F (38.8 C)  Recent Labs  Lab 02/15/18 1214 02/15/18 1557  WBC 8.2  --   CREATININE 0.84  --   LATICACIDVEN  --  1.28    Estimated Creatinine Clearance: 91.2 mL/min (by C-G formula based on SCr of 0.84 mg/dL).    No Known Allergies  Antimicrobials this admission: Vanc 9/6 >>    Dose adjustments this admission: None  Microbiology results: 9/6 BCx:  9/6 UCx:    Thank you for allowing pharmacy to be a part of this patient's care.  Albertina Parr, PharmD., BCPS Clinical Pharmacist Clinical phone for 02/15/18 until 11pm: [phone]

## 2018-02-15 NOTE — H&P (Signed)
TRH H&P   Patient Demographics:    Krystal Shaffer, is a 42 y.o. female  MRN: [id number]   DOB - 12-13-1975  Admit Date - 02/15/2018  Outpatient Primary MD for the patient is Seward Carol, MD  Referring MD/NP/PA:  Carmon Sails  Outpatient Specialists:   Peter Martinique  Patient coming from: home  Chief Complaint  Patient presents with  . Chest Pain      HPI:    Krystal Shaffer  is a 42 y.o. 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  " like sitting on the chest" .  Slight dyspnea.  + fever,  Dry cough.  Pt denies palp, diarrhea, brbpr, black stool, dysuria, hematuria.  Pt presented to ED, for evaluation of chest pain.   In Ed,  CTA chest IMPRESSION: 1. No acute findings. No pulmonary embolism seen, with mild study limitations detailed above. No evidence of pneumonia or pulmonary edema. No pericardial effusion. 2. No evidence of surgical complicating feature.  Na 140, K 3.6 Bun 13, Creatinine 0.84 Wbc 8.2, Hgb 12.7, Plt 256 D dimer 1.13 INR 1.11 Trop <5.0 Lactic acid 1.28 Urinalysis negative  Pt will be admitted for w/up of fever, tachycardia, probable early sepsis of unclear source as well as back pain.    Review of systems:    In addition to the HPI above,  No Headache, No changes with Vision or hearing, No problems swallowing food or Liquids, No Chest pain, Cough or Shortness of Breath, No Abdominal pain, No Nausea or Vommitting, Bowel movements are regular, No Blood in stool or Urine, No dysuria, No new skin rashes or bruises, No new joints pains-aches,  No new weakness, tingling, numbness in any extremity, No recent weight gain or loss, No polyuria, polydypsia or polyphagia, No significant Mental Stressors.  A full 10 point Review of Systems was done, except as stated above, all other  Review of Systems were negative.   With Past History of the following :    Past Medical History:  Diagnosis Date  . Cystitis 05/2017  . Endocarditis   . Family history of adverse reaction to anesthesia    " mother has a hard time waking "  . Migraine   . Migraines       Past Surgical History:  Procedure Laterality Date  . AORTIC VALVE REPLACEMENT N/A 06/04/2017   Procedure: AORTIC VALVE REPLACEMENT (AVR);  Surgeon: Prescott Gum, Collier Salina, MD;  Location: [facility];  Service: Open Heart Surgery;  Laterality: N/A;  . MULTIPLE EXTRACTIONS WITH ALVEOLOPLASTY N/A 05/31/2017   Procedure: Extraction of tooth  #'s [phone] with alveoloplasty and gross debridement of remaining dentition;  Surgeon: Lenn Cal, DDS;  Location: [facility];  Service: Oral Surgery;  Laterality: N/A;  . TEE WITHOUT CARDIOVERSION N/A 05/29/2017   Procedure: TRANSESOPHAGEAL ECHOCARDIOGRAM (TEE);  Surgeon: Candee Furbish  C, MD;  Location: [facility];  Service: Cardiovascular;  Laterality: N/A;  . TEE WITHOUT CARDIOVERSION N/A 06/04/2017   Procedure: TRANSESOPHAGEAL ECHOCARDIOGRAM (TEE);  Surgeon: Prescott Gum, Collier Salina, MD;  Location: [facility];  Service: Open Heart Surgery;  Laterality: N/A;  . WISDOM TOOTH EXTRACTION        Social History:     Social History   Tobacco Use  . Smoking status: Never Smoker  . Smokeless tobacco: Never Used  Substance Use Topics  . Alcohol use: No     Lives - at home, with her mother and sone  Mobility - walks by self   Family History :     Family History  Problem Relation Age of Onset  . Obesity Mother   . Hypertension Sister       Home Medications:   Prior to Admission medications   Medication Sig Start Date End Date Taking? Authorizing Provider  amoxicillin (AMOXIL) 500 MG tablet Take 2,000 mg by mouth See admin instructions. Take 4 tablets (2000 mg) by mouth one hour prior to dental appointments 01/29/18  Yes [provider]  aspirin EC 81 MG EC tablet Take 1  tablet (81 mg total) by mouth daily. 06/10/17  Yes Gold, Wilder Glade, PA-C  aspirin-acetaminophen-caffeine (EXCEDRIN EXTRA STRENGTH) [phone] MG tablet Take 1 tablet by mouth every 6 (six) hours as needed for headache.   Yes [provider]  lisinopril-hydrochlorothiazide (PRINZIDE,ZESTORETIC) 20-12.5 MG tablet Take 1 tablet by mouth daily. 12/06/17  Yes [provider]  medroxyPROGESTERone (DEPO-PROVERA) 150 MG/ML injection Inject 150 mg into the muscle every 3 (three) months.   Yes [provider]  metoprolol tartrate (LOPRESSOR) 25 MG tablet Take 1 tablet (25 mg total) by mouth 2 (two) times daily. 08/14/17  Yes Martinique, Peter M, MD  mirtazapine (REMERON) 30 MG tablet Take 30 mg by mouth at bedtime as needed (sleep).    Yes [provider]  warfarin (COUMADIN) 5 MG tablet Take 1/2 to 1 tablet daily as directed by coumadin clinic. Patient taking differently: Take 2.5-5 mg by mouth See admin instructions. Take 1/2 tablet (2.5 mg) on Tuesday and Thursday evening, take 1 tablet (5 mg) on Sunday, Monday, Wednesday, Friday, Saturday evening - or as directed by coumadin clinic. 02/13/18  Yes Martinique, Peter M, MD  lisinopril (PRINIVIL,ZESTRIL) 10 MG tablet Take 1 tablet (10 mg total) by mouth daily. Patient not taking: Reported on 12/12/2017 09/26/17 09/21/18  Martinique, Peter M, MD     Allergies:    No Known Allergies   Physical Exam:   Vitals  Blood pressure 121/75, pulse (!) 112, temperature (S) (!) 101.8 F (38.8 C), temperature source Oral, resp. rate (!) 21, height 5\' 4"  (1.626 m), weight 80.3 kg, SpO2 100 %.   1. General  lying in bed in NAD,    2. Normal affect and insight, Not Suicidal or Homicidal, Awake Alert, Oriented X 3.  3. No F.N deficits, ALL C.Nerves Intact, Strength 5/5 all 4 extremities, Sensation intact all 4 extremities, Plantars down going.  4. Ears and Eyes appear Normal, Conjunctivae clear, PERRLA. Moist Oral Mucosa.  5. Supple Neck, No JVD, No  cervical lymphadenopathy appriciated, No Carotid Bruits.  6. Symmetrical Chest wall movement, Good air movement bilaterally, CTAB.  7. Tachy s1, s2,   8. Positive Bowel Sounds, Abdomen Soft, No tenderness, No organomegaly appriciated,No rebound -guarding or rigidity.  9.  No Cyanosis, Normal Skin Turgor, No Skin Rash or Bruise.  10. Good muscle tone,  joints appear normal , no effusions, Normal ROM.  11. No Palpable Lymph Nodes in Neck or Axillae  No osler, no janeway, no splinter    Data Review:    CBC Recent Labs  Lab 02/15/18 1214  WBC 8.2  HGB 12.7  HCT 41.1  PLT 256  MCV 90.3  MCH 27.9  MCHC 30.9  RDW 13.2   ------------------------------------------------------------------------------------------------------------------  Chemistries  Recent Labs  Lab 02/15/18 1214  NA 140  K 3.6  CL 110  CO2 21*  GLUCOSE 101*  BUN 13  CREATININE 0.84  CALCIUM 8.9   ------------------------------------------------------------------------------------------------------------------ estimated creatinine clearance is 91.2 mL/min (by C-G formula based on SCr of 0.84 mg/dL). ------------------------------------------------------------------------------------------------------------------ No results for input(s): TSH, T4TOTAL, T3FREE, THYROIDAB in the last 72 hours.  Invalid input(s): FREET3  Coagulation profile Recent Labs  Lab 02/13/18 1531 02/15/18 1214  INR 1.0* 1.11   ------------------------------------------------------------------------------------------------------------------- Recent Labs    02/15/18 1214  DDIMER 1.13*   -------------------------------------------------------------------------------------------------------------------  Cardiac Enzymes No results for input(s): CKMB, TROPONINI, MYOGLOBIN in the last 168 hours.  Invalid input(s):  CK ------------------------------------------------------------------------------------------------------------------ No results found for: BNP   ---------------------------------------------------------------------------------------------------------------  Urinalysis    Component Value Date/Time   COLORURINE YELLOW 02/15/2018 [place] 02/15/2018 1645   LABSPEC >1.046 (H) 02/15/2018 1645   PHURINE 5.0 02/15/2018 [place] 02/15/2018 1645   HGBUR MODERATE (A) 02/15/2018 [place] 02/15/2018 [place] 02/15/2018 1645   PROTEINUR NEGATIVE 02/15/2018 1645   UROBILINOGEN 1.0 07/22/2013 1727   NITRITE NEGATIVE 02/15/2018 1645   LEUKOCYTESUR NEGATIVE 02/15/2018 1645    ----------------------------------------------------------------------------------------------------------------   Imaging Results:    Dg Chest 2 View  Result Date: 02/15/2018 CLINICAL DATA:  Sudden onset of mid sternal chest pain with shortness of breath while at work there is associated nausea and vomiting and lightheadedness. History of aortic valve replacement last year. EXAM: CHEST - 2 VIEW COMPARISON:  PA and lateral chest x-ray of December 12, 2017 and chest CT scan of the same day FINDINGS: The lungs are well-expanded and clear. The heart and pulmonary vascularity are normal. There is a prosthetic aortic valve. There is no pleural effusion. The sternal wires are intact. IMPRESSION: There is no active cardiopulmonary disease. Electronically Signed   By: David  Martinique M.D.   On: 02/15/2018 12:36   Ct Angio Chest Pe W And/or Wo Contrast  Result Date: 02/15/2018 CLINICAL DATA:  Midline upper chest pain, shortness of breath. History of pericarditis resulting in a valve replacement in 2018. EXAM: CT ANGIOGRAPHY CHEST WITH CONTRAST TECHNIQUE: Multidetector CT imaging of the chest was performed using the standard protocol during bolus administration of intravenous  contrast. Multiplanar CT image reconstructions and MIPs were obtained to evaluate the vascular anatomy. CONTRAST:  65 cc ISOVUE-370 IOPAMIDOL (ISOVUE-370) INJECTION 76% COMPARISON:  Chest CT angiogram dated 12/12/2017. FINDINGS: Cardiovascular: There is no pulmonary embolism identified within the main, lobar or segmental pulmonary arteries bilaterally, although evaluation of the most peripheral segmental and subsegmental pulmonary arteries is somewhat limited by patient body habitus and mild patient breathing motion artifact. No thoracic aortic aneurysm or evidence of aortic dissection appreciated. Heart size is within normal limits. No pericardial effusion. Aortic valve hardware in place. Mediastinum/Nodes: No mass or enlarged lymph nodes appreciated within the mediastinum or perihilar regions. Esophagus appears normal. Trachea and central bronchi are unremarkable. Lungs/Pleura: Lungs are clear.  No pleural effusion or pneumothorax. Upper Abdomen: Limited images of the upper abdomen are  unremarkable. Musculoskeletal: No acute or significant osseous abnormality. Median sternotomy wires in place. Review of the MIP images confirms the above findings. IMPRESSION: 1. No acute findings. No pulmonary embolism seen, with mild study limitations detailed above. No evidence of pneumonia or pulmonary edema. No pericardial effusion. 2. No evidence of surgical complicating feature. Electronically Signed   By: Franki Cabot M.D.   On: 02/15/2018 14:15    ekg st at 118, nl axis, poor R progression , no st t change c/w ischemia    Assessment & Plan:    Principal Problem:   Sepsis ([place]) Active Problems:   Fever   Tachycardia    Fever  (recent dental work) Early sepsis (fever, tachycardia, tachypnea, acidosis bicarbonate low) Blood culture x2 vanco iv, zosyn iv pharmacy to dose  Tachycardia Tele Trop iq6h x3 Check TSH Check cardiac echo   CP Tele Trop I q6h x3 Check cardiac echo r/o endocarditis, and  evaluate EF Cardiology consult by Email sent  AVR Cont coumadin pharmacy to dose  Hypertension Cont Metoprolol 25mg  po bid Cont lisinopril /hydrochlorothiazide 20/12.5mg  po qday   Back pain T spine MRI   DVT Prophylaxis  Coumadin  SCDs  AM Labs Ordered, also please review Full Orders  Family Communication: Admission, patients condition and plan of care including tests being ordered have been discussed with the patient  FULL CODE who indicate understanding and agree with the plan and Code Status.  Code Status  FULL CODE  Likely DC to  home  Condition GUARDED    Consults called: cardiology by email   Admission status: inpatient,  Pt has fever and tachycardia, likely early sepsis, r/o endocarditis,  Pt will require iv abx. Awaiting cultures. Pt will likely require >2 nites stay for iv abx, for sepsis.    Time spent in minutes : 70   Jani Gravel M.D on 02/15/2018 at 9:25 PM  Between 7am to 7pm - Pager - [phone]. After 7pm go to [email or web address] - password [email or web address]  [facility] - Office  [phone]

## 2018-02-15 NOTE — ED Notes (Signed)
Pt reports that her pain is more so in her head vs chest from the nitroglycerin she received PTA. Hx aortic valve replacement. She reports fevers on and off since last night.

## 2018-02-15 NOTE — ED Provider Notes (Signed)
[facility] EMERGENCY DEPARTMENT Provider Note   CSN: [id number] Arrival date & time: 02/15/18  1139  History   Chief Complaint Chief Complaint  Patient presents with  . Chest Pain    HPI Krystal Shaffer is a 42 y.o. female with history of hypertension, endocarditis secondary to strep viridans from dental abscesses complicated by severe AI status post AVR in 2018, Coumadin therapy is here for evaluation of chest pain.  Chest pain is central with radiation to the left collarbone and between bilateral shoulder blades.  Described as non pleuritic, non exertional, constant, initially sharp but now more of a dull discomfort after nitro x 4 en rouite, intermittent.  Pain began 10 to 15 minutes after she had an episode of nausea and forceful vomiting, around 10:45 AM today.  States when she got to work she had nausea and her stomach felt sour, subsequently she threw up pieces of hot dog she had last night.  Nausea and vomiting have significantly improved.  Chest pain is associated with shortness of breath described as "I cannot keep my breath", lightheadedness worse with standing.  At baseline, states that she has "pulsating" chest pain frequently throughout the day since her AVR surgery and intermittent shortness of breath that typically lasts only a few minutes, never this severe.  She had nitroglycerin prior to arrival which made the chest pain less sharp but it continues to feel pressure and now she has a headache.  Admits to recent noncompliance with Coumadin for 3 to 4 days.  Knows her INR 2 days ago was low.  Recent 7-hour car trip July 26.  No calf pain or leg swelling, hemoptysis, estrogen use.  Denies associated fevers, cough, chest congestion, abdominal pain, diarrhea, dysuria, hematuria.   HPI  Past Medical History:  Diagnosis Date  . Cystitis 05/2017  . Family history of adverse reaction to anesthesia    " mother has a hard time waking "  . Migraine   . Migraines      Patient Active Problem List   Diagnosis Date Noted  . Encounter for therapeutic drug monitoring 06/11/2017  . Aortic valve replaced 06/11/2017  . Endocarditis 06/04/2017  . Right-sided chest pain   . Bacteremia due to Streptococcus 05/26/2017  . Aortic valve endocarditis 05/26/2017  . Diastolic murmur 81/19/1478  . Bacterial vaginosis 05/26/2017  . Atypical chest pain 05/26/2017  . Temporal headache 05/26/2017  . Streptococcal sepsis ([place]) 05/26/2017  . Sepsis ([place]) 05/24/2017  . Acute pyelonephritis 05/18/2017  . Back pain 05/18/2017  . Nausea and vomiting 05/18/2017  . UTI (urinary tract infection) 05/18/2017    Past Surgical History:  Procedure Laterality Date  . AORTIC VALVE REPLACEMENT N/A 06/04/2017   Procedure: AORTIC VALVE REPLACEMENT (AVR);  Surgeon: Prescott Gum, Collier Salina, MD;  Location: [place];  Service: Open Heart Surgery;  Laterality: N/A;  . MULTIPLE EXTRACTIONS WITH ALVEOLOPLASTY N/A 05/31/2017   Procedure: Extraction of tooth  #'s [phone] with alveoloplasty and gross debridement of remaining dentition;  Surgeon: Lenn Cal, DDS;  Location: [facility];  Service: Oral Surgery;  Laterality: N/A;  . TEE WITHOUT CARDIOVERSION N/A 05/29/2017   Procedure: TRANSESOPHAGEAL ECHOCARDIOGRAM (TEE);  Surgeon: Jerline Pain, MD;  Location: [facility] ENDOSCOPY;  Service: Cardiovascular;  Laterality: N/A;  . TEE WITHOUT CARDIOVERSION N/A 06/04/2017   Procedure: TRANSESOPHAGEAL ECHOCARDIOGRAM (TEE);  Surgeon: Prescott Gum, Collier Salina, MD;  Location: [facility];  Service: Open Heart Surgery;  Laterality: N/A;  . WISDOM TOOTH EXTRACTION  OB History   None      Home Medications    Prior to Admission medications   Medication Sig Start Date End Date Taking? Authorizing Provider  amoxicillin (AMOXIL) 500 MG tablet Take 2,000 mg by mouth See admin instructions. Take 4 tablets (2000 mg) by mouth one hour prior to dental appointments 01/29/18  Yes [provider]  aspirin EC 81 MG EC  tablet Take 1 tablet (81 mg total) by mouth daily. 06/10/17  Yes Gold, Wilder Glade, PA-C  aspirin-acetaminophen-caffeine (EXCEDRIN EXTRA STRENGTH) [phone] MG tablet Take 1 tablet by mouth every 6 (six) hours as needed for headache.   Yes [provider]  lisinopril-hydrochlorothiazide (PRINZIDE,ZESTORETIC) 20-12.5 MG tablet Take 1 tablet by mouth daily. 12/06/17  Yes [provider]  medroxyPROGESTERone (DEPO-PROVERA) 150 MG/ML injection Inject 150 mg into the muscle every 3 (three) months.   Yes [provider]  metoprolol tartrate (LOPRESSOR) 25 MG tablet Take 1 tablet (25 mg total) by mouth 2 (two) times daily. 08/14/17  Yes Martinique, Peter M, MD  mirtazapine (REMERON) 30 MG tablet Take 30 mg by mouth at bedtime as needed (sleep).    Yes [provider]  warfarin (COUMADIN) 5 MG tablet Take 1/2 to 1 tablet daily as directed by coumadin clinic. Patient taking differently: Take 2.5-5 mg by mouth See admin instructions. Take 1/2 tablet (2.5 mg) on Tuesday and Thursday evening, take 1 tablet (5 mg) on Sunday, Monday, Wednesday, Friday, Saturday evening - or as directed by coumadin clinic. 02/13/18  Yes Martinique, Peter M, MD  lisinopril (PRINIVIL,ZESTRIL) 10 MG tablet Take 1 tablet (10 mg total) by mouth daily. Patient not taking: Reported on 12/12/2017 09/26/17 09/21/18  Martinique, Peter M, MD    Family History Family History  Problem Relation Age of Onset  . Obesity Mother   . Hypertension Sister     Social History Social History   Tobacco Use  . Smoking status: Never Smoker  . Smokeless tobacco: Never Used  Substance Use Topics  . Alcohol use: No  . Drug use: No     Allergies   Patient has no known allergies.   Review of Systems Review of Systems  Respiratory: Positive for shortness of breath.   Cardiovascular: Positive for chest pain and palpitations.  Gastrointestinal: Positive for nausea and vomiting.  Neurological: Positive for light-headedness and  headaches.  Hematological: Bruises/bleeds easily.  All other systems reviewed and are negative.    Physical Exam Updated Vital Signs BP 121/75   Pulse (!) 112   Temp (S) (!) 101.8 F (38.8 C) (Oral) Comment: EDP notified  Resp (!) 21   Ht 5\' 4"  (1.626 m)   Wt 80.3 kg   SpO2 100%   BMI 30.38 kg/m   Physical Exam  Constitutional: She appears well-developed and well-nourished.  NAD. Non toxic.   HENT:  Head: Normocephalic and atraumatic.  Nose: Nose normal.  Eyes: Conjunctivae, EOM and lids are normal.  Neck: Trachea normal and normal range of motion.  Trachea midline. No paraspinal muscle tenderness. Full PROM without neck pain. No rigidity.   Cardiovascular: Regular rhythm, S1 normal and S2 normal. Tachycardia present.  Murmur heard. Pulses:      Carotid pulses are 2+ on the right side, and 2+ on the left side.      Radial pulses are 2+ on the right side, and 2+ on the left side.       Dorsalis pedis pulses are 2+ on the right side, and 2+  on the left side.  Mild tenderness along midline sternal surgical scar, per patient this is her baseline tenderness.  No LE edema or calf tenderness.   Pulmonary/Chest: Effort normal and breath sounds normal. Tachypnea noted.  Abdominal: Soft. Bowel sounds are normal. There is no tenderness.  No epigastric tenderness. No distention.   Musculoskeletal:       Right lower leg: She exhibits tenderness.  CTL spine: no midline tenderness. Moderate reproducible paraspinal T spine tenderness.   Neurological: She is alert. GCS eye subscore is 4. GCS verbal subscore is 5. GCS motor subscore is 6.  Skin: Skin is warm and dry. Capillary refill takes less than 2 seconds.  No rash to chest wall  Psychiatric: Her speech is normal and behavior is normal. Thought content normal. Cognition and memory are normal.     ED Treatments / Results  Labs (all labs ordered are listed, but only abnormal results are displayed) Labs Reviewed  BASIC METABOLIC  PANEL - Abnormal; Notable for the following components:      Result Value   CO2 21 (*)    Glucose, Bld 101 (*)    All other components within normal limits  D-DIMER, QUANTITATIVE (NOT AT [facility]) - Abnormal; Notable for the following components:   D-Dimer, Quant 1.13 (*)    All other components within normal limits  URINALYSIS, ROUTINE W REFLEX MICROSCOPIC - Abnormal; Notable for the following components:   Specific Gravity, Urine >1.046 (*)    Hgb urine dipstick MODERATE (*)    Bacteria, UA RARE (*)    All other components within normal limits  C-REACTIVE PROTEIN - Abnormal; Notable for the following components:   CRP 3.7 (*)    All other components within normal limits  URINE CULTURE  CULTURE, BLOOD (ROUTINE X 2)  CULTURE, BLOOD (ROUTINE X 2)  CBC  PROTIME-INR  SEDIMENTATION RATE  RAPID URINE DRUG SCREEN, HOSP PERFORMED  I-STAT TROPONIN, ED  I-STAT BETA HCG BLOOD, ED ([facility], [facility], [facility] ONLY)  I-STAT TROPONIN, ED  I-STAT CG4 LACTIC ACID, ED  I-STAT CG4 LACTIC ACID, ED    EKG EKG Interpretation  Date/Time:  Friday February 15 2018 11:50:23 EDT Ventricular Rate:  118 PR Interval:  148 QRS Duration: 68 QT Interval:  332 QTC Calculation: 465 R Axis:   95 Text Interpretation:  Sinus tachycardia Possible Left atrial enlargement Rightward axis Anterior infarct , age undetermined Abnormal ECG Since last tracing rate faster Confirmed by Wandra Arthurs [phone]) on 02/15/2018 12:04:37 PM Also confirmed by Wandra Arthurs [phone]), editor Philomena Doheny [phone])  on 02/15/2018 2:14:45 PM   Radiology Dg Chest 2 View  Result Date: 02/15/2018 CLINICAL DATA:  Sudden onset of mid sternal chest pain with shortness of breath while at work there is associated nausea and vomiting and lightheadedness. History of aortic valve replacement last year. EXAM: CHEST - 2 VIEW COMPARISON:  PA and lateral chest x-ray of December 12, 2017 and chest CT scan of the same day FINDINGS: The lungs are well-expanded and clear. The heart  and pulmonary vascularity are normal. There is a prosthetic aortic valve. There is no pleural effusion. The sternal wires are intact. IMPRESSION: There is no active cardiopulmonary disease. Electronically Signed   By: David  Martinique M.D.   On: 02/15/2018 12:36   Ct Angio Chest Pe W And/or Wo Contrast  Result Date: 02/15/2018 CLINICAL DATA:  Midline upper chest pain, shortness of breath. History of pericarditis resulting in a valve replacement in 2018. EXAM: CT  ANGIOGRAPHY CHEST WITH CONTRAST TECHNIQUE: Multidetector CT imaging of the chest was performed using the standard protocol during bolus administration of intravenous contrast. Multiplanar CT image reconstructions and MIPs were obtained to evaluate the vascular anatomy. CONTRAST:  65 cc ISOVUE-370 IOPAMIDOL (ISOVUE-370) INJECTION 76% COMPARISON:  Chest CT angiogram dated 12/12/2017. FINDINGS: Cardiovascular: There is no pulmonary embolism identified within the main, lobar or segmental pulmonary arteries bilaterally, although evaluation of the most peripheral segmental and subsegmental pulmonary arteries is somewhat limited by patient body habitus and mild patient breathing motion artifact. No thoracic aortic aneurysm or evidence of aortic dissection appreciated. Heart size is within normal limits. No pericardial effusion. Aortic valve hardware in place. Mediastinum/Nodes: No mass or enlarged lymph nodes appreciated within the mediastinum or perihilar regions. Esophagus appears normal. Trachea and central bronchi are unremarkable. Lungs/Pleura: Lungs are clear.  No pleural effusion or pneumothorax. Upper Abdomen: Limited images of the upper abdomen are unremarkable. Musculoskeletal: No acute or significant osseous abnormality. Median sternotomy wires in place. Review of the MIP images confirms the above findings. IMPRESSION: 1. No acute findings. No pulmonary embolism seen, with mild study limitations detailed above. No evidence of pneumonia or pulmonary  edema. No pericardial effusion. 2. No evidence of surgical complicating feature. Electronically Signed   By: Franki Cabot M.D.   On: 02/15/2018 14:15    Procedures .Critical Care Performed by: Kinnie Feil, PA-C Authorized by: Kinnie Feil, PA-C   Critical care provider statement:    Critical care time (minutes):  45   Critical care was necessary to treat or prevent imminent or life-threatening deterioration of the following conditions: SIRS response.   Critical care was time spent personally by me on the following activities:  Discussions with consultants, evaluation of patient's response to treatment, examination of patient, ordering and performing treatments and interventions, ordering and review of laboratory studies, ordering and review of radiographic studies, pulse oximetry, re-evaluation of patient's condition, obtaining history from patient or surrogate and review of old charts   (including critical care time)  Medications Ordered in ED Medications  piperacillin-tazobactam (ZOSYN) IVPB 3.375 g (3.375 g Intravenous New Bag/Given 02/15/18 1730)  vancomycin (VANCOCIN) 1,500 mg in sodium chloride 0.9 % 500 mL IVPB (0 mg Intravenous Stopped 02/15/18 1740)  vancomycin (VANCOCIN) IVPB 1000 mg/200 mL premix (has no administration in time range)  iopamidol (ISOVUE-370) 76 % injection (65 mLs Intravenous Contrast Given 02/15/18 1404)  acetaminophen (TYLENOL) tablet 1,000 mg (1,000 mg Oral Given 02/15/18 1522)  sodium chloride 0.9 % bolus 1,000 mL (0 mLs Intravenous Stopped 02/15/18 1729)  metoCLOPramide (REGLAN) injection 10 mg (10 mg Intravenous Given 02/15/18 1531)  sodium chloride 0.9 % bolus 1,000 mL (0 mLs Intravenous Stopped 02/15/18 1902)  cefTRIAXone (ROCEPHIN) 1 g in sodium chloride 0.9 % 100 mL IVPB (1 g Intravenous New Bag/Given 02/15/18 1731)     Initial Impression / Assessment and Plan / ED Course  I have reviewed the triage vital signs and the nursing notes.  Pertinent labs  & imaging results that were available during my care of the patient were reviewed by me and considered in my medical decision making (see chart for details).  Clinical Course as of Feb 16 2044  Fri Feb 15, 2018  1527 Reevaluated patient to discuss work-up so far.  She remains tachycardic, has been nauseous and has thrown up in the ER.  She denies abdominal pain but reports pain to her flanks bilaterally and persistent headache since taking 4 nitro PTA.  Will  give migraine cocktail, send urine and reassess.   [CG]  1934 Called regarding delay in sed rate; per lab tech 8 min left    [CG]    Clinical Course User Index [CG] Kinnie Feil, PA-C   Sharp CP and SOB after forceful vomiting in pt with h/o endocarditis, severe AI s/p AVR. Recent non compliance with coumadin and subtherapeutic INR.  Considering ACS vs PE vs esophageal perforation.  Nausea/vomiting likely from meal last night that she threw up today, she has no abd tenderness.    1349: work up obtained in triage reviewed. D-dimer 1.13. Tachycardia on EKG. Will obtain PT/INT, CTA to r/o PE and other intrathoracic injury. Delta trop @ 1610.   9604: CTA negative for PE, infiltrate or other acute injury. Pt spiked a fever 101.8, remains tachycardic, continues to reports HA, nausea and vomiting.  Repeat abdominal exam benign. She has reproducible T spine muscle tenderness. Lactic, UA, delta trop pending.  Migraine cocktail ordered.   1903: pt seen by Dr Darl Householder, MRI ordered to r/o epidural abscess given back pain, fevers, h/o endocarditis. She continues to have intermittent HA, tachycardia. No meningeal signs. Delta trop undetectable.  2030: elevated CRP. Fevers, tachypnea, tachycardia. SIRS response without unclear etiology. Will request admission. Consider echo given h/o endocarditis.   2045: Spoke to Dr Maudie Mercury who will admit pt to SDU. Pending MRI to r/o epidural abscess. Shared visit with Dr Darl Householder. Final Clinical Impressions(s) / ED Diagnoses     Final diagnoses:  SIRS (systemic inflammatory response syndrome) [facility])    ED Discharge Orders    None       Arlean Hopping 02/15/18 2046    Drenda Freeze, MD 02/16/18 [id number]

## 2018-02-15 NOTE — ED Triage Notes (Signed)
Pt presents with sudden onset of mid-sternal chest pain and shortness of breath while at work.   Pt reports when she woke up, she "didn't feel right".  Pt reports pain has been constant and radiates into her back.  +nausea and vomiting  Pt received NTG x 3 and zofran 4mg  from [facility]

## 2018-02-15 NOTE — ED Notes (Signed)
Patient transported to X-ray 

## 2018-02-15 NOTE — ED Notes (Signed)
Patient transported to CT 

## 2018-02-16 ENCOUNTER — Encounter (HOSPITAL_COMMUNITY): Payer: Self-pay | Admitting: Cardiology

## 2018-02-16 DIAGNOSIS — E876 Hypokalemia: Secondary | ICD-10-CM

## 2018-02-16 DIAGNOSIS — R5081 Fever presenting with conditions classified elsewhere: Secondary | ICD-10-CM

## 2018-02-16 DIAGNOSIS — R072 Precordial pain: Secondary | ICD-10-CM

## 2018-02-16 LAB — HCG, QUANTITATIVE, PREGNANCY: hCG, Beta Chain, Quant, S: <1 m[IU]/mL (ref <5)

## 2018-02-16 LAB — CBC
HCT: 37.6 % (ref 36.0–46.0)
HEMOGLOBIN: 11.8 g/dL — AB (ref 12.0–15.0)
MCH: 27.6 pg (ref 26.0–34.0)
MCHC: 31.4 g/dL (ref 30.0–36.0)
MCV: 87.9 fL (ref 78.0–100.0)
PLATELETS: 229 10*3/uL (ref 150–400)
RBC: 4.28 MIL/uL (ref 3.87–5.11)
RDW: 13.1 % (ref 11.5–15.5)
WBC: 4.1 10*3/uL (ref 4.0–10.5)

## 2018-02-16 LAB — COMPREHENSIVE METABOLIC PANEL
ALT: 12 U/L (ref 0–44)
ANION GAP: 7 (ref 5–15)
AST: 18 U/L (ref 15–41)
Albumin: 2.7 g/dL — ABNORMAL LOW (ref 3.5–5.0)
Alkaline Phosphatase: 40 U/L (ref 38–126)
BUN: 8 mg/dL (ref 6–20)
CHLORIDE: 108 mmol/L (ref 98–111)
CO2: 21 mmol/L — AB (ref 22–32)
Calcium: 7.5 mg/dL — ABNORMAL LOW (ref 8.9–10.3)
Creatinine, Ser: 0.76 mg/dL (ref 0.44–1.00)
Glucose, Bld: 93 mg/dL (ref 70–99)
POTASSIUM: 2.9 mmol/L — AB (ref 3.5–5.1)
SODIUM: 136 mmol/L (ref 135–145)
Total Bilirubin: 0.8 mg/dL (ref 0.3–1.2)
Total Protein: 5.6 g/dL — ABNORMAL LOW (ref 6.5–8.1)

## 2018-02-16 LAB — PROTIME-INR
INR: 1.41
PROTHROMBIN TIME: 17.2 s — AB (ref 11.4–15.2)

## 2018-02-16 LAB — URINE CULTURE

## 2018-02-16 LAB — RAPID URINE DRUG SCREEN, HOSP PERFORMED
AMPHETAMINES: NOT DETECTED
BARBITURATES: NOT DETECTED
Benzodiazepines: NOT DETECTED
Cocaine: NOT DETECTED
Opiates: POSITIVE — AB
Tetrahydrocannabinol: POSITIVE — AB

## 2018-02-16 LAB — MRSA PCR SCREENING: MRSA BY PCR: NEGATIVE

## 2018-02-16 LAB — SEDIMENTATION RATE: Sed Rate: 13 mm/hr (ref 0–22)

## 2018-02-16 LAB — HEPARIN LEVEL (UNFRACTIONATED): HEPARIN UNFRACTIONATED: 0.11 [IU]/mL — AB (ref 0.30–0.70)

## 2018-02-16 LAB — MAGNESIUM: MAGNESIUM: 1.5 mg/dL — AB (ref 1.7–2.4)

## 2018-02-16 MED ORDER — MAGNESIUM SULFATE 2 GM/50ML IV SOLN
2.0000 g | Freq: Once | INTRAVENOUS | Status: AC
  Administered 2018-02-16: 2 g via INTRAVENOUS
  Filled 2018-02-16: qty 50

## 2018-02-16 MED ORDER — SODIUM CHLORIDE 0.9 % IV SOLN
INTRAVENOUS | Status: DC | PRN
  Administered 2018-02-16: 500 mL via INTRAVENOUS

## 2018-02-16 MED ORDER — SODIUM CHLORIDE 0.9 % IV SOLN
2.0000 g | Freq: Every day | INTRAVENOUS | Status: DC
  Administered 2018-02-16 – 2018-02-20 (×5): 2 g via INTRAVENOUS
  Filled 2018-02-16 (×5): qty 20

## 2018-02-16 MED ORDER — IBUPROFEN 600 MG PO TABS: 600.0000 mg | ORAL_TABLET | Freq: Four times a day (QID) | ORAL | Status: DC | PRN

## 2018-02-16 MED ORDER — DIPHENHYDRAMINE HCL 50 MG/ML IJ SOLN
12.5000 mg | Freq: Once | INTRAMUSCULAR | Status: AC
  Administered 2018-02-16: 12.5 mg via INTRAVENOUS
  Filled 2018-02-16: qty 1

## 2018-02-16 MED ORDER — PROMETHAZINE HCL 25 MG/ML IJ SOLN
25.0000 mg | Freq: Once | INTRAMUSCULAR | Status: AC
  Administered 2018-02-16: 25 mg via INTRAVENOUS
  Filled 2018-02-16: qty 1

## 2018-02-16 MED ORDER — WARFARIN SODIUM 7.5 MG PO TABS
7.5000 mg | ORAL_TABLET | Freq: Once | ORAL | Status: AC
  Administered 2018-02-16: 7.5 mg via ORAL
  Filled 2018-02-16: qty 1

## 2018-02-16 MED ORDER — PANTOPRAZOLE SODIUM 40 MG PO TBEC
40.0000 mg | DELAYED_RELEASE_TABLET | Freq: Every day | ORAL | Status: DC
  Administered 2018-02-17 – 2018-02-21 (×5): 40 mg via ORAL
  Filled 2018-02-16 (×5): qty 1

## 2018-02-16 MED ORDER — HEPARIN BOLUS VIA INFUSION
2200.0000 [IU] | Freq: Once | INTRAVENOUS | Status: AC
  Administered 2018-02-16: 2200 [IU] via INTRAVENOUS
  Filled 2018-02-16: qty 2200

## 2018-02-16 MED ORDER — POTASSIUM CHLORIDE CRYS ER 20 MEQ PO TBCR
40.0000 meq | EXTENDED_RELEASE_TABLET | Freq: Once | ORAL | Status: AC
  Administered 2018-02-16: 40 meq via ORAL
  Filled 2018-02-16: qty 2

## 2018-02-16 MED ORDER — KETOROLAC TROMETHAMINE 30 MG/ML IJ SOLN
30.0000 mg | Freq: Once | INTRAMUSCULAR | Status: AC
  Administered 2018-02-16: 30 mg via INTRAVENOUS
  Filled 2018-02-16: qty 1

## 2018-02-16 MED ORDER — KETOROLAC TROMETHAMINE 15 MG/ML IJ SOLN
15.0000 mg | Freq: Once | INTRAMUSCULAR | Status: AC
  Administered 2018-02-16: 15 mg via INTRAVENOUS
  Filled 2018-02-16: qty 1

## 2018-02-16 MED ORDER — HEPARIN (PORCINE) IN NACL 100-0.45 UNIT/ML-% IJ SOLN
1350.0000 [IU]/h | INTRAMUSCULAR | Status: DC
  Administered 2018-02-16: 1100 [IU]/h via INTRAVENOUS
  Administered 2018-02-17 – 2018-02-19 (×4): 1350 [IU]/h via INTRAVENOUS
  Filled 2018-02-16 (×5): qty 250

## 2018-02-16 NOTE — Progress Notes (Signed)
ANTICOAGULATION CONSULT NOTE   Pharmacy Consult for Heparin Indication: Mechanical valve  No Known Allergies  Patient Measurements: Height: 5\' 4"  (162.6 cm) Weight: 178 lb 9.2 oz (81 kg) IBW/kg (Calculated) : 54.7 Heparin Dosing Weight: 72 kg  Vital Signs: Temp: 98.5 F (36.9 C) (09/07 1657) Temp Source: Oral (09/07 1657) BP: 127/78 (09/07 1657) Pulse Rate: 83 (09/07 1657)  Labs: Recent Labs    02/15/18 1214 02/16/18 0340 02/16/18 0720 02/16/18 1848  HGB 12.7 11.8*  --   --   HCT 41.1 37.6  --   --   PLT 256 229  --   --   LABPROT 14.2 17.2*  --   --   INR 1.11 1.41  --   --   HEPARINUNFRC  --   --   --  0.11*  CREATININE 0.84  --  0.76  --    Estimated Creatinine Clearance: 96.2 mL/min (by C-G formula based on SCr of 0.76 mg/dL).  Medical History: Past Medical History:  Diagnosis Date  . Cystitis 05/2017  . Endocarditis   . Family history of adverse reaction to anesthesia    " mother has a hard time waking "  . Migraine   . Migraines   . SIRS (systemic inflammatory response syndrome) ([place]) 02/15/2018   Assessment: 42 yo F presents with CP. PMH of mechanical AVR on Coumadin 5mg  daily exc for 2.5mg  on Tue/Thurs. Hx of noncompliance. INR 1.11 on admit. CBC stable. Pharmacy consulted to bridge with heparin.  Heparin level 0.11 this evening.   Goal of Therapy:  Heparin level 0.3-0.7 INR 2-3 Monitor platelets by anticoagulation protocol: Yes   Plan:  Heparin 2200 Units IV bolus Increase Heparin gtt to  1350 units/hr Heparin level in 6 hours and daily while on heparin Monitor daily INR, CBC, s/s of bleed   Jameyah Fennewald A. Levada Dy, PharmD, [place] Pager: [phone] Please utilize Amion for appropriate phone number to reach the unit pharmacist ([place])

## 2018-02-16 NOTE — Consult Note (Signed)
Cardiology Consultation:   Patient ID: Krystal Shaffer MRN: [id number]; DOB: Apr 30, 1976  Admit date: 02/15/2018 Date of Consult: 02/16/2018  Primary Care Provider: Seward Carol, MD Primary Cardiologist: Dr Martinique   Patient Profile:   Krystal Shaffer is a 42 y.o. female with a hx of bicuspid aortic valve with prior aortic valve replacement in the setting of bacterial endocarditis December 2018 who is being seen today for the evaluation of chest pain at the request of Jani Gravel MD.  History of Present Illness:   Patient presented in early December 2018 with an E. coli urinary tract infection.  She subsequently had recurrent fevers and blood cultures grew strep viridans.  She was found to have multiple dental abscesses and bacterial endocarditis with severe aortic insufficiency.  She underwent aortic valve replacement on June 04, 2017 with a mechanical valve.  Note patient had a CTA preoperatively that showed calcium score 0 and no evidence of coronary artery disease.  Most recent echocardiogram April 2019 showed ejection fraction 50 to 40%, grade 1 diastolic dysfunction, mechanical aortic valve with mild aortic insufficiency, mild left atrial enlargement and mild right ventricular enlargement.  Patient presented with complaints of chest pain, fever and dry cough and was admitted by the hospitalist.  Cardiology asked to evaluate.  Patient states that she has had night sweats for approximately 4 weeks.  Yesterday she developed nausea followed by chest heaviness.  Heaviness radiated to her right shoulder.  Not pleuritic or positional.  Heaviness lasted 2 hours.  She also described dyspnea.  She then developed fevers.  She denies productive cough, congestion, diarrhea, dysuria or recent dental procedures.  She was admitted and cardiology asked to evaluate.  Past Medical History:  Diagnosis Date  . Cystitis 05/2017  . Endocarditis   . Family history of adverse reaction to anesthesia    " mother  has a hard time waking "  . Migraine   . Migraines   . SIRS (systemic inflammatory response syndrome) ([place]) 02/15/2018    Past Surgical History:  Procedure Laterality Date  . AORTIC VALVE REPLACEMENT N/A 06/04/2017   Procedure: AORTIC VALVE REPLACEMENT (AVR);  Surgeon: Prescott Gum, Collier Salina, MD;  Location: [facility];  Service: Open Heart Surgery;  Laterality: N/A;  . MULTIPLE EXTRACTIONS WITH ALVEOLOPLASTY N/A 05/31/2017   Procedure: Extraction of tooth  #'s [phone] with alveoloplasty and gross debridement of remaining dentition;  Surgeon: Lenn Cal, DDS;  Location: [place];  Service: Oral Surgery;  Laterality: N/A;  . TEE WITHOUT CARDIOVERSION N/A 05/29/2017   Procedure: TRANSESOPHAGEAL ECHOCARDIOGRAM (TEE);  Surgeon: Jerline Pain, MD;  Location: [facility] ENDOSCOPY;  Service: Cardiovascular;  Laterality: N/A;  . TEE WITHOUT CARDIOVERSION N/A 06/04/2017   Procedure: TRANSESOPHAGEAL ECHOCARDIOGRAM (TEE);  Surgeon: Prescott Gum, Collier Salina, MD;  Location: [place];  Service: Open Heart Surgery;  Laterality: N/A;  . WISDOM TOOTH EXTRACTION        Inpatient Medications: Scheduled Meds: . aspirin EC  81 mg Oral Daily  . lisinopril  20 mg Oral Daily   And  . hydrochlorothiazide  12.5 mg Oral Daily  . metoprolol tartrate  25 mg Oral BID  . Warfarin - Pharmacist Dosing Inpatient   Does not apply q1800   Continuous Infusions: . sodium chloride 500 mL (02/16/18 0346)  . vancomycin Stopped (02/15/18 1740)  . vancomycin 1,000 mg (02/16/18 0348)   PRN Meds: sodium chloride, acetaminophen, mirtazapine  Allergies:   No Known Allergies  Social History:   Social History   Socioeconomic History  .  Marital status: Legally Separated    Spouse name: Not on file  . Number of children: Not on file  . Years of education: Not on file  . Highest education level: Not on file  Occupational History  . Not on file  Social Needs  . Financial resource strain: Not on file  . Food insecurity:    Worry: Not on  file    Inability: Not on file  . Transportation needs:    Medical: Not on file    Non-medical: Not on file  Tobacco Use  . Smoking status: Former Research scientist (life sciences)  . Smokeless tobacco: Never Used  Substance and Sexual Activity  . Alcohol use: Yes    Comment: occasional  . Drug use: No  . Sexual activity: Yes    Birth control/protection: None  Lifestyle  . Physical activity:    Days per week: Not on file    Minutes per session: Not on file  . Stress: Not on file  Relationships  . Social connections:    Talks on phone: Not on file    Gets together: Not on file    Attends religious service: Not on file    Active member of club or organization: Not on file    Attends meetings of clubs or organizations: Not on file    Relationship status: Not on file  . Intimate partner violence:    Fear of current or ex partner: Not on file    Emotionally abused: Not on file    Physically abused: Not on file    Forced sexual activity: Not on file  Other Topics Concern  . Not on file  Social History Narrative  . Not on file    Family History:    Family History  Problem Relation Age of Onset  . Obesity Mother   . Hypertension Sister   . Cirrhosis Father      ROS:  Please see the history of present illness.  Patient describes night sweats for 4 weeks, fevers and leg weakness but no hemoptysis, melena or hematochezia.  No diarrhea or dysuria. All other ROS reviewed and negative.     Physical Exam/Data:   Vitals:   02/15/18 2309 02/16/18 0018 02/16/18 0335 02/16/18 0800  BP: 137/77 126/84 (!) 150/89 121/76  Pulse: (!) 114 94 (!) 102 82  Resp:  19    Temp:  99.6 F (37.6 C)  98.5 F (36.9 C)  TempSrc:  Oral  Oral  SpO2:  100%  100%  Weight:      Height:        Intake/Output Summary (Last 24 hours) at 02/16/2018 0942 Last data filed at 02/15/2018 2300 Gross per 24 hour  Intake 2740 ml  Output -  Net 2740 ml   Filed Weights   02/15/18 1150 02/15/18 2230  Weight: 80.3 kg 81 kg    Body mass index is 30.65 kg/m.  General:  Well nourished, well developed, in no acute distress HEENT: normal Lymph: no adenopathy Neck: no JVD Endocrine:  No thryomegaly Vascular: No carotid bruits; FA pulses 2+ bilaterally without bruits  Cardiac: Regular rate and rhythm.  Crisp mechanical valve sound.  2/6 systolic murmur left sternal border.  No diastolic murmur. Lungs:  clear to auscultation bilaterally, no wheezing, rhonchi or rales  Abd: soft, nontender, no hepatomegaly  Ext: no edema Musculoskeletal:  No deformities, BUE and BLE strength normal and equal Skin: warm and dry no peripheral stigmata of SBE. Neuro:  CNs 2-12 intact, no  focal abnormalities noted Psych:  Normal affect   EKG:  The EKG was personally reviewed and demonstrates: Sinus tachycardia, right axis deviation, cannot rule out prior septal infarct, nonspecific ST changes. Telemetry:  Telemetry was personally reviewed and demonstrates:  Sinus tachycardia  Laboratory Data:  Chemistry Recent Labs  Lab 02/15/18 1214 02/16/18 0720  NA 140 136  K 3.6 2.9*  CL 110 108  CO2 21* 21*  GLUCOSE 101* 93  BUN 13 8  CREATININE 0.84 0.76  CALCIUM 8.9 7.5*  GFRNONAA >60 >60  GFRAA >60 >60  ANIONGAP 9 7    Recent Labs  Lab 02/16/18 0720  PROT 5.6*  ALBUMIN 2.7*  AST 18  ALT 12  ALKPHOS 40  BILITOT 0.8   Hematology Recent Labs  Lab 02/15/18 1214 02/16/18 0340  WBC 8.2 4.1  RBC 4.55 4.28  HGB 12.7 11.8*  HCT 41.1 37.6  MCV 90.3 87.9  MCH 27.9 27.6  MCHC 30.9 31.4  RDW 13.2 13.1  PLT 256 229   DDimer  Recent Labs  Lab 02/15/18 1214  DDIMER 1.13*    Radiology/Studies:  Dg Chest 2 View  Result Date: 02/15/2018 CLINICAL DATA:  Sudden onset of mid sternal chest pain with shortness of breath while at work there is associated nausea and vomiting and lightheadedness. History of aortic valve replacement last year. EXAM: CHEST - 2 VIEW COMPARISON:  PA and lateral chest x-ray of December 12, 2017 and  chest CT scan of the same day FINDINGS: The lungs are well-expanded and clear. The heart and pulmonary vascularity are normal. There is a prosthetic aortic valve. There is no pleural effusion. The sternal wires are intact. IMPRESSION: There is no active cardiopulmonary disease. Electronically Signed   By: David  Martinique M.D.   On: 02/15/2018 12:36   Ct Angio Chest Pe W And/or Wo Contrast  Result Date: 02/15/2018 CLINICAL DATA:  Midline upper chest pain, shortness of breath. History of pericarditis resulting in a valve replacement in 2018. EXAM: CT ANGIOGRAPHY CHEST WITH CONTRAST TECHNIQUE: Multidetector CT imaging of the chest was performed using the standard protocol during bolus administration of intravenous contrast. Multiplanar CT image reconstructions and MIPs were obtained to evaluate the vascular anatomy. CONTRAST:  65 cc ISOVUE-370 IOPAMIDOL (ISOVUE-370) INJECTION 76% COMPARISON:  Chest CT angiogram dated 12/12/2017. FINDINGS: Cardiovascular: There is no pulmonary embolism identified within the main, lobar or segmental pulmonary arteries bilaterally, although evaluation of the most peripheral segmental and subsegmental pulmonary arteries is somewhat limited by patient body habitus and mild patient breathing motion artifact. No thoracic aortic aneurysm or evidence of aortic dissection appreciated. Heart size is within normal limits. No pericardial effusion. Aortic valve hardware in place. Mediastinum/Nodes: No mass or enlarged lymph nodes appreciated within the mediastinum or perihilar regions. Esophagus appears normal. Trachea and central bronchi are unremarkable. Lungs/Pleura: Lungs are clear.  No pleural effusion or pneumothorax. Upper Abdomen: Limited images of the upper abdomen are unremarkable. Musculoskeletal: No acute or significant osseous abnormality. Median sternotomy wires in place. Review of the MIP images confirms the above findings. IMPRESSION: 1. No acute findings. No pulmonary embolism  seen, with mild study limitations detailed above. No evidence of pneumonia or pulmonary edema. No pericardial effusion. 2. No evidence of surgical complicating feature. Electronically Signed   By: Franki Cabot M.D.   On: 02/15/2018 14:15   Mr Thoracic Spine W Wo Contrast  Result Date: 02/15/2018 CLINICAL DATA:  Mid back pain.  History of endocarditis. EXAM: MRI THORACIC WITHOUT AND WITH  CONTRAST TECHNIQUE: Multiplanar and multiecho pulse sequences of the thoracic spine were obtained without and with intravenous contrast. CONTRAST:  8 mL Gadavist COMPARISON:  None. FINDINGS: MRI THORACIC SPINE FINDINGS Alignment:  Normal Vertebrae: Normal marrow signal throughout the thoracic spine. Cord:  Normal signal and caliber. Paraspinal and other soft tissues: The visualized paraspinal and retroperitoneal structures are normal. Disc levels: There is no disc herniation, spinal canal stenosis or nerve root impingement. There is no epidural abscess or fluid collection. No abnormal contrast enhancement. IMPRESSION: Normal thoracic spine MRI.  No epidural abscess. Electronically Signed   By: Ulyses Jarred M.D.   On: 02/15/2018 21:52    Assessment and Plan:   1 chest pain-etiology unclear.  Her electrocardiogram shows no diagnostic ST changes.  Enzymes are negative.  Preoperative CTA prior to her recent aortic valve replacement showed calcium score 0 and no coronary disease.  I would not pursue further ischemia evaluation. 2 fever-concerning in patient with history of aortic valve replacement in December 2018.  Her valve was replaced because of endocarditis.  Blood cultures are pending.  I agree with continuing antibiotics (she is presently on vancomycin, Zosyn and Rocephin).  We will arrange a transthoracic echocardiogram.  She may require transesophageal echocardiogram particularly if blood cultures positive.  Check sed rate. 3 status post aortic valve replacement-continue Coumadin and aspirin. 4 hypertension-would  continue preadmission blood pressure medications and follow. 5 hypokalemia-supplement.  For questions or updates, please contact [facility] Please consult [email or web address] for contact info under     Signed, Kirk Ruths, MD  02/16/2018 9:42 AM

## 2018-02-16 NOTE — Progress Notes (Addendum)
$'@IPLOG'p$ @        PROGRESS NOTE                                                                                                                                                                                                             Patient Demographics:    Krystal Shaffer, is a 42 y.o. female, DOB - Feb 16, 1976, [id number]  Admit date - 02/15/2018   Admitting Physician Jani Gravel, MD  Outpatient Primary MD for the patient is Seward Carol, MD  LOS - 1  Chief Complaint  Patient presents with  . Chest Pain       Brief Narrative  -   Krystal Shaffer  is a 42 y.o. 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  " like sitting on the chest" .  Slight dyspnea.  + fever,  Dry cough.  Pt denies palp, diarrhea, brbpr, black stool, dysuria, hematuria.  Pt presented to ED, for evaluation of chest pain.    Subjective:    Krystal Shaffer today has, No headache, No chest pain, No abdominal pain - No Nausea, No new weakness tingling or numbness, No Cough - SOB.     Assessment  & Plan :     1.  Fever with chest pain in a patient with history of strep endocarditis requiring AVR about 6 months ago, likely source at that time was poor dentition.  Currently not septic, she has been placed on empiric vancomycin will add Rocephin as well, follow blood cultures, ESR is stable CRP is borderline elevated, she has so far undergone CT angiogram chest, MRI T-spine, UA and chest x-ray are all unremarkable.  Will closely monitor.  Echogram pending, cardiology on board.  2.  HX of AVR.  On Coumadin with INR subtherapeutic, start heparin drip, pharmacy consulted.  3. UDS +ve for opioids along with marijuana.  She denies doing any drugs or abusing narcotics, her urine drug screen and UA sample were obtained around 5 PM yesterday and she received only 1 dose of IV morphine in the ER which was close to 10 PM, I have confirmed this with the pharmacist.   However patient denies using any narcotics or drugs and I told her that sometimes a later sample could also be collected, but since she had a UDS positive I had to clarify it based on Endocarditis as part of history taking.  4.  Hypokalemia and hypomagnesemia.  Replace both and monitor.  5.  Migraines.  PRN NSAIDs  short term.   Addendum - patient complaint of Migrane headaches around noon time, PRN Motrin, Tylenol ordered patient refused, gave IV Toradol, called again few hrs later that headache not better, Migrane cocktail ordered Toradol+phenergan+benadryl - patient then asked to talk to me, I called her through the RN and she was extremely rude " you think I am just a cry baby" I told her not at all and explained the meds I ordered, I also asked what did she take for headaches at home that usually works, she replied "I take nothing as had no headaches for a longtime, you think I use Narcotics" I told her no but her UDS was certainly positive for it and then explained to her what is written in #5 above, she said she does not want to talk anymore and would like to talk to charge RN, requested charge RN to be connected to the patient. Also upon Med Rec in the ER to be noted that patient reported she took Excedrin for headache few days prior to admission.     Family Communication  :  None  Code Status :  Full  Disposition Plan  :  Tele  Consults  :  Cards  Procedures  :    TTE  CTA - non acute  MRI T Spine - non acute  DVT Prophylaxis  :  Coumadin  Lab Results  Component Value Date   INR 1.41 02/16/2018   INR 1.11 02/15/2018   INR 1.0 (A) 02/13/2018     Lab Results  Component Value Date   PLT 229 02/16/2018    Diet :  Diet Order            Diet Heart Room service appropriate? Yes; Fluid consistency: Thin  Diet effective now               Inpatient Medications Scheduled Meds: . aspirin EC  81 mg Oral Daily  . diphenhydrAMINE  12.5 mg Intravenous Once  .  lisinopril  20 mg Oral Daily   And  . hydrochlorothiazide  12.5 mg Oral Daily  . ketorolac  30 mg Intravenous Once  . metoprolol tartrate  25 mg Oral BID  . pantoprazole  40 mg Oral Daily  . promethazine  25 mg Intravenous Once  . Warfarin - Pharmacist Dosing Inpatient   Does not apply q1800   Continuous Infusions: . sodium chloride 500 mL (02/16/18 0346)  . cefTRIAXone (ROCEPHIN)  IV 2 g (02/16/18 1312)  . heparin 1,100 Units/hr (02/16/18 1309)  . vancomycin Stopped (02/15/18 1740)  . vancomycin 1,000 mg (02/16/18 1755)   PRN Meds:.sodium chloride, acetaminophen, ibuprofen, mirtazapine  Antibiotics  :   Anti-infectives (From admission, onward)   Start     Dose/Rate Route Frequency Ordered Stop   02/16/18 1200  cefTRIAXone (ROCEPHIN) 2 g in sodium chloride 0.9 % 100 mL IVPB     2 g 200 mL/hr over 30 Minutes Intravenous Daily 02/16/18 1048     02/16/18 0300  vancomycin (VANCOCIN) IVPB 1000 mg/200 mL premix     1,000 mg 200 mL/hr over 60 Minutes Intravenous Every 8 hours 02/15/18 1717     02/15/18 1730  vancomycin (VANCOCIN) 1,500 mg in sodium chloride 0.9 % 500 mL IVPB     1,500 mg 250 mL/hr over 120 Minutes Intravenous  Once 02/15/18 1708     02/15/18 1715  piperacillin-tazobactam (ZOSYN) IVPB 3.375 g     3.375 g 12.5 mL/hr over 240 Minutes Intravenous  Once  02/15/18 1703 02/15/18 2130   02/15/18 1645  cefTRIAXone (ROCEPHIN) 1 g in sodium chloride 0.9 % 100 mL IVPB     1 g 200 mL/hr over 30 Minutes Intravenous  Once 02/15/18 1639 02/15/18 2152          Objective:   Vitals:   02/16/18 0800 02/16/18 0800 02/16/18 1317 02/16/18 1657  BP:  121/76 121/78 127/78  Pulse: 86 82 78 83  Resp: 11     Temp:  98.5 F (36.9 C) 98.9 F (37.2 C) 98.5 F (36.9 C)  TempSrc:  Oral Oral Oral  SpO2: 99% 100% 100% 100%  Weight:      Height:        Wt Readings from Last 3 Encounters:  02/15/18 81 kg  12/12/17 74.8 kg  11/21/17 73.7 kg     Intake/Output Summary (Last 24  hours) at 02/16/2018 1838 Last data filed at 02/15/2018 2300 Gross per 24 hour  Intake 1740 ml  Output -  Net 1740 ml     Physical Exam  Awake Alert, Oriented X 3, No new F.N deficits, Normal affect [place].AT,PERRAL Supple Neck,No JVD, No cervical lymphadenopathy appriciated.  Symmetrical Chest wall movement, Good air movement bilaterally, CTAB RRR,No Gallops,Rubs , mechanical aortic systolic murmur, No Parasternal Heave +ve B.Sounds, Abd Soft, No tenderness, No organomegaly appriciated, No rebound - guarding or rigidity. No Cyanosis, Clubbing or edema, No new Rash or bruise       Data Review:    CBC Recent Labs  Lab 02/15/18 1214 02/16/18 0340  WBC 8.2 4.1  HGB 12.7 11.8*  HCT 41.1 37.6  PLT 256 229  MCV 90.3 87.9  MCH 27.9 27.6  MCHC 30.9 31.4  RDW 13.2 13.1    Chemistries  Recent Labs  Lab 02/15/18 1214 02/16/18 0720  NA 140 136  K 3.6 2.9*  CL 110 108  CO2 21* 21*  GLUCOSE 101* 93  BUN 13 8  CREATININE 0.84 0.76  CALCIUM 8.9 7.5*  MG  --  1.5*  AST  --  18  ALT  --  12  ALKPHOS  --  40  BILITOT  --  0.8   ------------------------------------------------------------------------------------------------------------------ No results for input(s): CHOL, HDL, LDLCALC, TRIG, CHOLHDL, LDLDIRECT in the last 72 hours.  No results found for: HGBA1C ------------------------------------------------------------------------------------------------------------------ No results for input(s): TSH, T4TOTAL, T3FREE, THYROIDAB in the last 72 hours.  Invalid input(s): FREET3 ------------------------------------------------------------------------------------------------------------------ No results for input(s): VITAMINB12, FOLATE, FERRITIN, TIBC, IRON, RETICCTPCT in the last 72 hours.  Coagulation profile Recent Labs  Lab 02/13/18 1531 02/15/18 1214 02/16/18 0340  INR 1.0* 1.11 1.41    Recent Labs    02/15/18 1214  DDIMER 1.13*    Cardiac Enzymes No results  for input(s): CKMB, TROPONINI, MYOGLOBIN in the last 168 hours.  Invalid input(s): CK ------------------------------------------------------------------------------------------------------------------ No results found for: BNP  Micro Results Recent Results (from the past 240 hour(s))  Urine culture     Status: Abnormal   Collection Time: 02/15/18  4:36 PM  Result Value Ref Range Status   Specimen Description URINE, CATHETERIZED  Final   Special Requests   Final    NONE Performed at [facility], [address], [place], [place] [address]    Culture MULTIPLE SPECIES PRESENT, SUGGEST RECOLLECTION (A)  Final   Report Status 02/16/2018 FINAL  Final  Blood culture (routine x 2)     Status: None (Preliminary result)   Collection Time: 02/15/18  4:39 PM  Result Value Ref  Range Status   Specimen Description BLOOD RIGHT ANTECUBITAL  Final   Special Requests   Final    BOTTLES DRAWN AEROBIC AND ANAEROBIC Blood Culture adequate volume   Culture   Final    NO GROWTH < 24 HOURS Performed at [facility], [address], [place], [place] [address]    Report Status PENDING  Incomplete  Blood culture (routine x 2)     Status: None (Preliminary result)   Collection Time: 02/15/18  4:44 PM  Result Value Ref Range Status   Specimen Description BLOOD LEFT ANTECUBITAL  Final   Special Requests   Final    BOTTLES DRAWN AEROBIC AND ANAEROBIC Blood Culture adequate volume   Culture   Final    NO GROWTH < 24 HOURS Performed at [facility], [place] [address], [place], [place] [address]    Report Status PENDING  Incomplete  MRSA PCR Screening     Status: None   Collection Time: 02/16/18 12:54 AM  Result Value Ref Range Status   MRSA by PCR NEGATIVE NEGATIVE Final    Comment:        The GeneXpert MRSA Assay (FDA approved for NASAL specimens only), is one component of a comprehensive MRSA colonization surveillance program. It is not intended to diagnose MRSA infection nor to  guide or monitor treatment for MRSA infections. Performed at [facility], [place] [address], [place], [place] [address]     Radiology Reports Dg Chest 2 View  Result Date: 02/15/2018 CLINICAL DATA:  Sudden onset of mid sternal chest pain with shortness of breath while at work there is associated nausea and vomiting and lightheadedness. History of aortic valve replacement last year. EXAM: CHEST - 2 VIEW COMPARISON:  PA and lateral chest x-ray of December 12, 2017 and chest CT scan of the same day FINDINGS: The lungs are well-expanded and clear. The heart and pulmonary vascularity are normal. There is a prosthetic aortic valve. There is no pleural effusion. The sternal wires are intact. IMPRESSION: There is no active cardiopulmonary disease. Electronically Signed   By: David  Martinique M.D.   On: 02/15/2018 12:36   Ct Angio Chest Pe W And/or Wo Contrast  Result Date: 02/15/2018 CLINICAL DATA:  Midline upper chest pain, shortness of breath. History of pericarditis resulting in a valve replacement in 2018. EXAM: CT ANGIOGRAPHY CHEST WITH CONTRAST TECHNIQUE: Multidetector CT imaging of the chest was performed using the standard protocol during bolus administration of intravenous contrast. Multiplanar CT image reconstructions and MIPs were obtained to evaluate the vascular anatomy. CONTRAST:  65 cc ISOVUE-370 IOPAMIDOL (ISOVUE-370) INJECTION 76% COMPARISON:  Chest CT angiogram dated 12/12/2017. FINDINGS: Cardiovascular: There is no pulmonary embolism identified within the main, lobar or segmental pulmonary arteries bilaterally, although evaluation of the most peripheral segmental and subsegmental pulmonary arteries is somewhat limited by patient body habitus and mild patient breathing motion artifact. No thoracic aortic aneurysm or evidence of aortic dissection appreciated. Heart size is within normal limits. No pericardial effusion. Aortic valve hardware in place. Mediastinum/Nodes: No mass or enlarged lymph  nodes appreciated within the mediastinum or perihilar regions. Esophagus appears normal. Trachea and central bronchi are unremarkable. Lungs/Pleura: Lungs are clear.  No pleural effusion or pneumothorax. Upper Abdomen: Limited images of the upper abdomen are unremarkable. Musculoskeletal: No acute or significant osseous abnormality. Median sternotomy wires in place. Review of the MIP images confirms the above findings. IMPRESSION: 1. No acute findings. No pulmonary embolism seen, with mild study  limitations detailed above. No evidence of pneumonia or pulmonary edema. No pericardial effusion. 2. No evidence of surgical complicating feature. Electronically Signed   By: Franki Cabot M.D.   On: 02/15/2018 14:15   Mr Thoracic Spine W Wo Contrast  Result Date: 02/15/2018 CLINICAL DATA:  Mid back pain.  History of endocarditis. EXAM: MRI THORACIC WITHOUT AND WITH CONTRAST TECHNIQUE: Multiplanar and multiecho pulse sequences of the thoracic spine were obtained without and with intravenous contrast. CONTRAST:  8 mL Gadavist COMPARISON:  None. FINDINGS: MRI THORACIC SPINE FINDINGS Alignment:  Normal Vertebrae: Normal marrow signal throughout the thoracic spine. Cord:  Normal signal and caliber. Paraspinal and other soft tissues: The visualized paraspinal and retroperitoneal structures are normal. Disc levels: There is no disc herniation, spinal canal stenosis or nerve root impingement. There is no epidural abscess or fluid collection. No abnormal contrast enhancement. IMPRESSION: Normal thoracic spine MRI.  No epidural abscess. Electronically Signed   By: Ulyses Jarred M.D.   On: 02/15/2018 21:52    Time Spent in minutes  30   Lala Lund M.D on 02/16/2018 at 6:38 PM  To page go to [email or web address] - password [facility]

## 2018-02-16 NOTE — Progress Notes (Signed)
Verbal order received per Dr. Candiss Norse for migraine cocktail.

## 2018-02-16 NOTE — Progress Notes (Signed)
Pt headache 8/10 nothing on MAR to give at 4 am Tylenol not due until 5:30 am. Paged MD

## 2018-02-16 NOTE — Progress Notes (Signed)
FYI- Rapid drug screen done after morphine was administered.

## 2018-02-16 NOTE — Progress Notes (Signed)
Pt upset over medications ordered for migraine h/a. Pt very short and rude with me and requests to talk with MD regarding medications for migraine. Dr. Candiss Norse talked with pt via telephone. Pt again short and rude with MD over telephone. Requests to talk with Charge Nurse. Colletta Maryland, RN in to talk with pt about concerns.

## 2018-02-16 NOTE — Progress Notes (Signed)
[facility] for Coumadin/ Heparin Indication: Mechanical valve  No Known Allergies  Patient Measurements: Height: 5\' 4"  (162.6 cm) Weight: 178 lb 9.2 oz (81 kg) IBW/kg (Calculated) : 54.7 Heparin Dosing Weight: 72 kg  Vital Signs: Temp: 98.5 F (36.9 C) (09/07 0800) Temp Source: Oral (09/07 0800) BP: 121/76 (09/07 0800) Pulse Rate: 82 (09/07 0800)  Labs: Recent Labs    02/13/18 1531 02/15/18 1214 02/16/18 0340 02/16/18 0720  HGB  --  12.7 11.8*  --   HCT  --  41.1 37.6  --   PLT  --  256 229  --   LABPROT  --  14.2 17.2*  --   INR 1.0* 1.11 1.41  --   CREATININE  --  0.84  --  0.76   Estimated Creatinine Clearance: 96.2 mL/min (by C-G formula based on SCr of 0.76 mg/dL).  Medical History: Past Medical History:  Diagnosis Date  . Cystitis 05/2017  . Endocarditis   . Family history of adverse reaction to anesthesia    " mother has a hard time waking "  . Migraine   . Migraines   . SIRS (systemic inflammatory response syndrome) ([place]) 02/15/2018   Assessment: 42 yo F presents with CP. PMH of mechanical AVR on Coumadin 5mg  daily exc for 2.5mg  on Tue/Thurs. Hx of noncompliance. INR 1.11 on admit. CBC stable. Pharmacy consulted to bridge with heparin.  INR today: 1.41 following one dose of warfarin  Goal of Therapy:  INR 2-3 Monitor platelets by anticoagulation protocol: Yes   Plan:  Warfarin 7.5mg  PO x1 tonight Heparin gtt at 1100 units/hr Heparin level in 8 hours and daily while on heparin Monitor daily INR, CBC, s/s of bleed   Georga Bora, PharmD Clinical Pharmacist 02/16/2018 10:49 AM Please check AMION for all [facility] numbers

## 2018-02-17 ENCOUNTER — Inpatient Hospital Stay (HOSPITAL_COMMUNITY): Payer: 59

## 2018-02-17 DIAGNOSIS — R079 Chest pain, unspecified: Secondary | ICD-10-CM

## 2018-02-17 DIAGNOSIS — I5030 Unspecified diastolic (congestive) heart failure: Secondary | ICD-10-CM

## 2018-02-17 DIAGNOSIS — I10 Essential (primary) hypertension: Secondary | ICD-10-CM | POA: Diagnosis present

## 2018-02-17 DIAGNOSIS — G43919 Migraine, unspecified, intractable, without status migrainosus: Secondary | ICD-10-CM | POA: Diagnosis present

## 2018-02-17 DIAGNOSIS — Z952 Presence of prosthetic heart valve: Secondary | ICD-10-CM

## 2018-02-17 LAB — CBC
HEMATOCRIT: 34 % — AB (ref 36.0–46.0)
HEMOGLOBIN: 10.6 g/dL — AB (ref 12.0–15.0)
MCH: 27.7 pg (ref 26.0–34.0)
MCHC: 31.2 g/dL (ref 30.0–36.0)
MCV: 88.8 fL (ref 78.0–100.0)
Platelets: 208 10*3/uL (ref 150–400)
RBC: 3.83 MIL/uL — AB (ref 3.87–5.11)
RDW: 12.9 % (ref 11.5–15.5)
WBC: 3.1 10*3/uL — ABNORMAL LOW (ref 4.0–10.5)

## 2018-02-17 LAB — COMPREHENSIVE METABOLIC PANEL
ALK PHOS: 55 U/L (ref 38–126)
ALT: 11 U/L (ref 0–44)
ANION GAP: 5 (ref 5–15)
AST: 17 U/L (ref 15–41)
Albumin: 2.6 g/dL — ABNORMAL LOW (ref 3.5–5.0)
BILIRUBIN TOTAL: 0.3 mg/dL (ref 0.3–1.2)
BUN: 7 mg/dL (ref 6–20)
CALCIUM: 7.6 mg/dL — AB (ref 8.9–10.3)
CO2: 21 mmol/L — AB (ref 22–32)
CREATININE: 0.74 mg/dL (ref 0.44–1.00)
Chloride: 111 mmol/L (ref 98–111)
Glucose, Bld: 105 mg/dL — ABNORMAL HIGH (ref 70–99)
Potassium: 3.2 mmol/L — ABNORMAL LOW (ref 3.5–5.1)
Sodium: 137 mmol/L (ref 135–145)
TOTAL PROTEIN: 5.7 g/dL — AB (ref 6.5–8.1)

## 2018-02-17 LAB — ECHOCARDIOGRAM COMPLETE
HEIGHTINCHES: 64 in
WEIGHTICAEL: 2832.47 [oz_av]

## 2018-02-17 LAB — PROTIME-INR
INR: 1.91
Prothrombin Time: 21.7 seconds — ABNORMAL HIGH (ref 11.4–15.2)

## 2018-02-17 LAB — HEPARIN LEVEL (UNFRACTIONATED)
Heparin Unfractionated: 0.43 IU/mL (ref 0.30–0.70)
Heparin Unfractionated: 0.5 IU/mL (ref 0.30–0.70)

## 2018-02-17 LAB — VANCOMYCIN, TROUGH: Vancomycin Tr: 11 ug/mL — ABNORMAL LOW (ref 15–20)

## 2018-02-17 LAB — MAGNESIUM: Magnesium: 2 mg/dL (ref 1.7–2.4)

## 2018-02-17 MED ORDER — WARFARIN SODIUM 5 MG PO TABS
5.0000 mg | ORAL_TABLET | Freq: Once | ORAL | Status: AC
  Administered 2018-02-17: 5 mg via ORAL
  Filled 2018-02-17: qty 1

## 2018-02-17 MED ORDER — KETOROLAC TROMETHAMINE 30 MG/ML IJ SOLN
30.0000 mg | Freq: Once | INTRAMUSCULAR | Status: AC
  Administered 2018-02-17: 30 mg via INTRAVENOUS
  Filled 2018-02-17: qty 1

## 2018-02-17 MED ORDER — PROMETHAZINE HCL 25 MG/ML IJ SOLN: 25.0000 mg | Freq: Once | INTRAMUSCULAR | Status: DC

## 2018-02-17 MED ORDER — POTASSIUM CHLORIDE CRYS ER 20 MEQ PO TBCR
40.0000 meq | EXTENDED_RELEASE_TABLET | ORAL | Status: AC
  Administered 2018-02-17 (×2): 40 meq via ORAL
  Filled 2018-02-17 (×3): qty 2

## 2018-02-17 MED ORDER — PROMETHAZINE HCL 25 MG PO TABS: 12.5000 mg | ORAL_TABLET | Freq: Three times a day (TID) | ORAL | Status: DC | PRN

## 2018-02-17 MED ORDER — PROMETHAZINE HCL 25 MG PO TABS
25.0000 mg | ORAL_TABLET | Freq: Three times a day (TID) | ORAL | Status: DC | PRN
  Administered 2018-02-18 (×2): 25 mg via ORAL
  Filled 2018-02-17 (×2): qty 1

## 2018-02-17 MED ORDER — KETOROLAC TROMETHAMINE 30 MG/ML IJ SOLN
30.0000 mg | Freq: Three times a day (TID) | INTRAMUSCULAR | Status: DC | PRN
  Administered 2018-02-18 – 2018-02-20 (×4): 30 mg via INTRAVENOUS
  Filled 2018-02-17 (×4): qty 1

## 2018-02-17 MED ORDER — DIPHENHYDRAMINE HCL 50 MG/ML IJ SOLN
12.5000 mg | Freq: Three times a day (TID) | INTRAMUSCULAR | Status: DC | PRN
  Administered 2018-02-18 – 2018-02-20 (×5): 12.5 mg via INTRAVENOUS
  Filled 2018-02-17 (×5): qty 1

## 2018-02-17 MED ORDER — DIPHENHYDRAMINE HCL 50 MG/ML IJ SOLN
12.5000 mg | Freq: Once | INTRAMUSCULAR | Status: AC
  Administered 2018-02-17: 12.5 mg via INTRAVENOUS
  Filled 2018-02-17: qty 1

## 2018-02-17 MED ORDER — PROMETHAZINE HCL 25 MG PO TABS
25.0000 mg | ORAL_TABLET | Freq: Once | ORAL | Status: AC
  Administered 2018-02-17: 25 mg via ORAL
  Filled 2018-02-17: qty 1

## 2018-02-17 NOTE — Progress Notes (Signed)
Pharmacy Antibiotic Note  Krystal Shaffer is a 42 y.o. female admitted on 02/15/2018 with fever of unknown origin.  Pharmacy has been consulted for vancomycin dosing. 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   Vancomycin trough collected ~30 minutes late = 11; all cultures remain negative and the patient is clinically improving, no reported fevers and WBC remain WNL; will defer increasing vancomycin dose at this time  Plan: -Vancomycin1 gm IV Q 8 hours  -Ceftriaxone 2G g IV QD -F/u maintenance gram negative coverage  -VT at SS   Height: 5\' 4"  (162.6 cm) Weight: 177 lb 0.5 oz (80.3 kg) IBW/kg (Calculated) : 54.7  Temp (24hrs), Avg:98.4 F (36.9 C), Min:98.1 F (36.7 C), Max:98.6 F (37 C)  Recent Labs  Lab 02/15/18 1214 02/15/18 1557 02/15/18 1846 02/16/18 0340 02/16/18 0720 02/17/18 0326 02/17/18 1157  WBC 8.2  --   --  4.1  --  3.1*  --   CREATININE 0.84  --   --   --  0.76 0.74  --   LATICACIDVEN  --  1.28 1.60  --   --   --   --   VANCOTROUGH  --   --   --   --   --   --  11*    Estimated Creatinine Clearance: 95.8 mL/min (by C-G formula based on SCr of 0.74 mg/dL).    No Known Allergies  Antimicrobials this admission: Vanc 9/6 >>  CFTX 9/7>>  Dose adjustments this admission: None  Microbiology results: 9/6 BCx:  9/6 UCx:    Thank you for allowing pharmacy to be a part of this patient's care.  Georga Bora, PharmD Clinical Pharmacist 02/17/2018 1:53 PM Please check AMION for all [facility] numbers

## 2018-02-17 NOTE — Progress Notes (Signed)
Progress Note  Patient Name: Krystal Shaffer Date of Encounter: 02/17/2018  Primary Cardiologist: Dr Martinique  Subjective   Chest heaviness; no dyspnea  Inpatient Medications    Scheduled Meds: . aspirin EC  81 mg Oral Daily  . lisinopril  20 mg Oral Daily   And  . hydrochlorothiazide  12.5 mg Oral Daily  . metoprolol tartrate  25 mg Oral BID  . pantoprazole  40 mg Oral Daily  . Warfarin - Pharmacist Dosing Inpatient   Does not apply q1800   Continuous Infusions: . sodium chloride 500 mL (02/16/18 0346)  . cefTRIAXone (ROCEPHIN)  IV 2 g (02/16/18 1312)  . heparin 1,350 Units/hr (02/16/18 1939)  . vancomycin Stopped (02/15/18 1740)  . vancomycin Stopped (02/17/18 0420)   PRN Meds: sodium chloride, acetaminophen, ibuprofen, mirtazapine   Vital Signs    Vitals:   02/16/18 1947 02/16/18 2231 02/16/18 2300 02/17/18 0500  BP: (!) 141/90 (!) 159/125 126/81 127/75  Pulse: 73 93 74 77  Resp:   17 20  Temp: 98.3 F (36.8 C)  98.6 F (37 C) 98.6 F (37 C)  TempSrc: Oral  Oral Oral  SpO2: 100%  100% 100%  Weight:    80.3 kg  Height:        Intake/Output Summary (Last 24 hours) at 02/17/2018 0758 Last data filed at 02/17/2018 0400 Gross per 24 hour  Intake 1178.92 ml  Output 300 ml  Net 878.92 ml   Filed Weights   02/15/18 1150 02/15/18 2230 02/17/18 0500  Weight: 80.3 kg 81 kg 80.3 kg    Telemetry    Sinus - Personally Reviewed  Physical Exam   GEN: No acute distress.   Neck: No JVD Cardiac: RRR, Crisp mechanical valve sound; 2/6 systolic mumur; no DM Respiratory: Clear to auscultation bilaterally. GI: Soft, nontender, non-distended  MS: No edema Neuro:  Nonfocal  Psych: Normal affect   Labs    Chemistry Recent Labs  Lab 02/15/18 1214 02/16/18 0720 02/17/18 0326  NA 140 136 137  K 3.6 2.9* 3.2*  CL 110 108 111  CO2 21* 21* 21*  GLUCOSE 101* 93 105*  BUN 13 8 7   CREATININE 0.84 0.76 0.74  CALCIUM 8.9 7.5* 7.6*  PROT  --  5.6* 5.7*  ALBUMIN   --  2.7* 2.6*  AST  --  18 17  ALT  --  12 11  ALKPHOS  --  40 55  BILITOT  --  0.8 0.3  GFRNONAA >60 >60 >60  GFRAA >60 >60 >60  ANIONGAP 9 7 5      Hematology Recent Labs  Lab 02/15/18 1214 02/16/18 0340 02/17/18 0326  WBC 8.2 4.1 3.1*  RBC 4.55 4.28 3.83*  HGB 12.7 11.8* 10.6*  HCT 41.1 37.6 34.0*  MCV 90.3 87.9 88.8  MCH 27.9 27.6 27.7  MCHC 30.9 31.4 31.2  RDW 13.2 13.1 12.9  PLT 256 229 208     Recent Labs  Lab 02/15/18 1223 02/15/18 1551  TROPIPOC 0.00 0.02     DDimer  Recent Labs  Lab 02/15/18 1214  DDIMER 1.13*     Radiology    Dg Chest 2 View  Result Date: 02/15/2018 CLINICAL DATA:  Sudden onset of mid sternal chest pain with shortness of breath while at work there is associated nausea and vomiting and lightheadedness. History of aortic valve replacement last year. EXAM: CHEST - 2 VIEW COMPARISON:  PA and lateral chest x-ray of December 12, 2017 and chest CT scan  of the same day FINDINGS: The lungs are well-expanded and clear. The heart and pulmonary vascularity are normal. There is a prosthetic aortic valve. There is no pleural effusion. The sternal wires are intact. IMPRESSION: There is no active cardiopulmonary disease. Electronically Signed   By: David  Martinique M.D.   On: 02/15/2018 12:36   Ct Angio Chest Pe W And/or Wo Contrast  Result Date: 02/15/2018 CLINICAL DATA:  Midline upper chest pain, shortness of breath. History of pericarditis resulting in a valve replacement in 2018. EXAM: CT ANGIOGRAPHY CHEST WITH CONTRAST TECHNIQUE: Multidetector CT imaging of the chest was performed using the standard protocol during bolus administration of intravenous contrast. Multiplanar CT image reconstructions and MIPs were obtained to evaluate the vascular anatomy. CONTRAST:  65 cc ISOVUE-370 IOPAMIDOL (ISOVUE-370) INJECTION 76% COMPARISON:  Chest CT angiogram dated 12/12/2017. FINDINGS: Cardiovascular: There is no pulmonary embolism identified within the main, lobar or  segmental pulmonary arteries bilaterally, although evaluation of the most peripheral segmental and subsegmental pulmonary arteries is somewhat limited by patient body habitus and mild patient breathing motion artifact. No thoracic aortic aneurysm or evidence of aortic dissection appreciated. Heart size is within normal limits. No pericardial effusion. Aortic valve hardware in place. Mediastinum/Nodes: No mass or enlarged lymph nodes appreciated within the mediastinum or perihilar regions. Esophagus appears normal. Trachea and central bronchi are unremarkable. Lungs/Pleura: Lungs are clear.  No pleural effusion or pneumothorax. Upper Abdomen: Limited images of the upper abdomen are unremarkable. Musculoskeletal: No acute or significant osseous abnormality. Median sternotomy wires in place. Review of the MIP images confirms the above findings. IMPRESSION: 1. No acute findings. No pulmonary embolism seen, with mild study limitations detailed above. No evidence of pneumonia or pulmonary edema. No pericardial effusion. 2. No evidence of surgical complicating feature. Electronically Signed   By: Franki Cabot M.D.   On: 02/15/2018 14:15   Mr Thoracic Spine W Wo Contrast  Result Date: 02/15/2018 CLINICAL DATA:  Mid back pain.  History of endocarditis. EXAM: MRI THORACIC WITHOUT AND WITH CONTRAST TECHNIQUE: Multiplanar and multiecho pulse sequences of the thoracic spine were obtained without and with intravenous contrast. CONTRAST:  8 mL Gadavist COMPARISON:  None. FINDINGS: MRI THORACIC SPINE FINDINGS Alignment:  Normal Vertebrae: Normal marrow signal throughout the thoracic spine. Cord:  Normal signal and caliber. Paraspinal and other soft tissues: The visualized paraspinal and retroperitoneal structures are normal. Disc levels: There is no disc herniation, spinal canal stenosis or nerve root impingement. There is no epidural abscess or fluid collection. No abnormal contrast enhancement. IMPRESSION: Normal thoracic  spine MRI.  No epidural abscess. Electronically Signed   By: Ulyses Jarred M.D.   On: 02/15/2018 21:52    Patient Profile     42 y.o. female with past medical history of bicuspid aortic valve, prior aortic valve replacement in the setting of bacterial endocarditis December 2018 admitted with chest pain and fever.  Assessment & Plan    1 chest pain-patient ruled out.  As outlined in previous note preoperative CTA in December showed calcium score 0 and no coronary disease.  No further ischemia evaluation anticipated.  Also note follow-up CTA this admission showed stable aorta.   2 fever-Concerning in patient with prior history of SBE requiring aortic valve replacement in December 2018. Fevers have resolved.  Blood cultures are negative to date.  Sed rate is 13.  White blood cell count 3.1.  No evidence of peripheral stigmata of SBE on physical examination.  Await echocardiogram.  Continue antibiotics for now.  She may require transesophageal echocardiogram if blood cultures positive.   3 status post aortic valve replacement-continue Coumadin and aspirin. 4 hypertension-continue present medications and follow. 5 hypokalemia-supplement.  For questions or updates, please contact [place] Please consult [email or web address] for contact info under        Signed, Kirk Ruths, MD  02/17/2018, 7:58 AM

## 2018-02-17 NOTE — Progress Notes (Signed)
PROGRESS NOTE    Krystal Shaffer  PHX:[id number] DOB: 28-Dec-1975 DOA: 02/15/2018 PCP: Seward Carol, MD   Brief Narrative:  [name],[name], c/o n/v, and chest pain starting this am. " like sitting on the chest" . Slight dyspnea. + fever, Dry cough. Pt denies palp, diarrhea, brbpr, black stool, dysuria, hematuria. Pt presented to ED, for evaluation of chest pain.    Assessment & Plan:   Principal Problem:   Fever Active Problems:   Tachycardia   Chest pain   Essential hypertension   Intractable migraine without status migrainosus   S/P AVR  1. Fever with Chest Pain Patient had presented with fever, and substernal chest pain history of endocarditis status post aortic valve replacement.  Patient currently not septic.  Patient has been pancultured blood cultures pending.  CT angiogram chest negative for PE or any acute abnormalities.  MRI of the T-spine negative for epidural abscess or any acute abnormalities.  2D echo pending.  Sed rate of 13.  Patient may need a transesophageal echocardiogram if blood cultures are positive.  Continue empiric IV vancomycin and IV Rocephin for now.  Cardiology following and appreciate input and recommendations.  2.  Status post aortic valve replacement INR of 1.91.  Coumadin per pharmacy.  Continue aspirin.  3.  Migraine headaches It is noted per RN that cocktail of Toradol, Benadryl, Phenergan helped with patient's migraine headaches last night.  Will place on Toradol, Benadryl, Phenergan as needed.  Follow.  4.  Hypokalemia Replete.  Magnesium of 2.0.  5.  Hypertension Blood pressure stable.  Continue current regimen of lisinopril HCTZ, metoprolol.   DVT prophylaxis: Coumadin Code Status: Full Family Communication: Updated patient.  No family at bedside. Disposition Plan: To be determined.   Consultants:   Cardiology: Dr. Stanford Breed 02/16/2018  Procedures:    CT angiogram chest 02/15/2018  Chest x-ray 02/15/2018  MRI T-spine 02/15/2018  2D echo pending 02/17/2018    Antimicrobials:   IV Rocephin 02/15/2018  IV vancomycin 02/15/2018   Subjective: Patient laying in bed.  Denies any shortness of breath.  States some improvement with chest pain.  No nausea or vomiting.  Objective: Vitals:   02/16/18 2300 02/17/18 0500 02/17/18 0907 02/17/18 1438  BP: 126/81 127/75 132/81 123/83  Pulse: 74 77 72 75  Resp: 17 20 15    Temp: 98.6 F (37 C) 98.6 F (37 C)  98.7 F (37.1 C)  TempSrc: Oral Oral  Oral  SpO2: 100% 100%  100%  Weight:  80.3 kg    Height:        Intake/Output Summary (Last 24 hours) at 02/17/2018 1622 Last data filed at 02/17/2018 1312 Gross per 24 hour  Intake 1800.59 ml  Output 300 ml  Net 1500.59 ml   Filed Weights   02/15/18 1150 02/15/18 2230 02/17/18 0500  Weight: 80.3 kg 81 kg 80.3 kg    Examination:  General exam: Appears calm and comfortable  Respiratory system: Clear to auscultation. Respiratory effort normal. Cardiovascular system: S1 & S2 heard, RRR mechanical click/aortic systolic murmur. No JVD, rubs, gallops. No pedal edema. Gastrointestinal system: Abdomen is nondistended, soft and nontender. No organomegaly or masses felt. Normal bowel sounds heard. Central nervous system: Alert and oriented. No focal neurological deficits. Extremities: Symmetric 5 x 5 power. Skin: No rashes, lesions or ulcers Psychiatry: Judgement and insight appear normal. Mood & affect appropriate.     Data Reviewed: I have personally reviewed following labs and imaging studies  CBC: Recent Labs  Lab 02/15/18 1214 02/16/18 0340 02/17/18 0326  WBC 8.2 4.1 3.1*  HGB 12.7 11.8* 10.6*  HCT 41.1 37.6 34.0*  MCV 90.3 87.9 88.8  PLT 256 229 062   Basic Metabolic Panel: Recent Labs  Lab 02/15/18 1214 02/16/18 0720 02/17/18 0326  NA 140 136 137  K 3.6 2.9* 3.2*  CL 110 108 111  CO2 21* 21* 21*  GLUCOSE 101* 93 105*   BUN 13 8 7   CREATININE 0.84 0.76 0.74  CALCIUM 8.9 7.5* 7.6*  MG  --  1.5* 2.0   GFR: Estimated Creatinine Clearance: 95.8 mL/min (by C-G formula based on SCr of 0.74 mg/dL). Liver Function Tests: Recent Labs  Lab 02/16/18 0720 02/17/18 0326  AST 18 17  ALT 12 11  ALKPHOS 40 55  BILITOT 0.8 0.3  PROT 5.6* 5.7*  ALBUMIN 2.7* 2.6*   No results for input(s): LIPASE, AMYLASE in the last 168 hours. No results for input(s): AMMONIA in the last 168 hours. Coagulation Profile: Recent Labs  Lab 02/13/18 1531 02/15/18 1214 02/16/18 0340 02/17/18 0326  INR 1.0* 1.11 1.41 1.91   Cardiac Enzymes: No results for input(s): CKTOTAL, CKMB, CKMBINDEX, TROPONINI in the last 168 hours. BNP (last 3 results) No results for input(s): PROBNP in the last 8760 hours. HbA1C: No results for input(s): HGBA1C in the last 72 hours. CBG: No results for input(s): GLUCAP in the last 168 hours. Lipid Profile: No results for input(s): CHOL, HDL, LDLCALC, TRIG, CHOLHDL, LDLDIRECT in the last 72 hours. Thyroid Function Tests: No results for input(s): TSH, T4TOTAL, FREET4, T3FREE, THYROIDAB in the last 72 hours. Anemia Panel: No results for input(s): VITAMINB12, FOLATE, FERRITIN, TIBC, IRON, RETICCTPCT in the last 72 hours. Sepsis Labs: Recent Labs  Lab 02/15/18 1557 02/15/18 1846  LATICACIDVEN 1.28 1.60    Recent Results (from the past 240 hour(s))  Urine culture     Status: Abnormal   Collection Time: 02/15/18  4:36 PM  Result Value Ref Range Status   Specimen Description URINE, CATHETERIZED  Final   Special Requests   Final    NONE Performed at [facility], [place] [address], [place], [place] [address]    Culture MULTIPLE SPECIES PRESENT, SUGGEST RECOLLECTION (A)  Final   Report Status 02/16/2018 FINAL  Final  Blood culture (routine x 2)     Status: None (Preliminary result)   Collection Time: 02/15/18  4:39 PM  Result Value Ref Range Status   Specimen Description BLOOD RIGHT  ANTECUBITAL  Final   Special Requests   Final    BOTTLES DRAWN AEROBIC AND ANAEROBIC Blood Culture adequate volume   Culture   Final    NO GROWTH 2 DAYS Performed at [facility], [place] [address], [place], [place] [address]    Report Status PENDING  Incomplete  Blood culture (routine x 2)     Status: None (Preliminary result)   Collection Time: 02/15/18  4:44 PM  Result Value Ref Range Status   Specimen Description BLOOD LEFT ANTECUBITAL  Final   Special Requests   Final    BOTTLES DRAWN AEROBIC AND ANAEROBIC Blood Culture adequate volume   Culture   Final    NO GROWTH 2 DAYS Performed at [facility], [place] [address], [place], [place] [address]    Report Status PENDING  Incomplete  MRSA PCR Screening     Status: None   Collection Time: 02/16/18 12:54 AM  Result Value Ref Range  Status   MRSA by PCR NEGATIVE NEGATIVE Final    Comment:        The GeneXpert MRSA Assay (FDA approved for NASAL specimens only), is one component of a comprehensive MRSA colonization surveillance program. It is not intended to diagnose MRSA infection nor to guide or monitor treatment for MRSA infections. Performed at [facility], [place] [address], [place], [place] [address]          Radiology Studies: Mr Thoracic Spine W Wo Contrast  Result Date: 02/15/2018 CLINICAL DATA:  Mid back pain.  History of endocarditis. EXAM: MRI THORACIC WITHOUT AND WITH CONTRAST TECHNIQUE: Multiplanar and multiecho pulse sequences of the thoracic spine were obtained without and with intravenous contrast. CONTRAST:  8 mL Gadavist COMPARISON:  None. FINDINGS: MRI THORACIC SPINE FINDINGS Alignment:  Normal Vertebrae: Normal marrow signal throughout the thoracic spine. Cord:  Normal signal and caliber. Paraspinal and other soft tissues: The visualized paraspinal and retroperitoneal structures are normal. Disc levels: There is no disc herniation, spinal canal stenosis or nerve root impingement. There is no  epidural abscess or fluid collection. No abnormal contrast enhancement. IMPRESSION: Normal thoracic spine MRI.  No epidural abscess. Electronically Signed   By: Ulyses Jarred M.D.   On: 02/15/2018 21:52        Scheduled Meds: . aspirin EC  81 mg Oral Daily  . diphenhydrAMINE  12.5 mg Intravenous Once  . lisinopril  20 mg Oral Daily   And  . hydrochlorothiazide  12.5 mg Oral Daily  . ketorolac  30 mg Intravenous Once  . metoprolol tartrate  25 mg Oral BID  . pantoprazole  40 mg Oral Daily  . promethazine  25 mg Oral Once  . warfarin  5 mg Oral ONCE-1800  . Warfarin - Pharmacist Dosing Inpatient   Does not apply q1800   Continuous Infusions: . sodium chloride 10 mL/hr at 02/17/18 1312  . cefTRIAXone (ROCEPHIN)  IV Stopped (02/17/18 0931)  . heparin 1,350 Units/hr (02/17/18 1312)  . vancomycin Stopped (02/15/18 1740)  . vancomycin 1,000 mg (02/17/18 1312)     LOS: 2 days    Time spent: 35 minutes    Irine Seal, MD [facility] Pager [phone]  If 7PM-7AM, please contact night-coverage [email or web address] Password [facility] 02/17/2018, 4:22 PM

## 2018-02-17 NOTE — Progress Notes (Signed)
  Echocardiogram 2D Echocardiogram has been performed.  Krystal Shaffer L Androw 02/17/2018, 3:23 PM

## 2018-02-17 NOTE — Progress Notes (Signed)
ANTICOAGULATION CONSULT NOTE   Pharmacy Consult for Coumadin/ Heparin Indication: Mechanical valve  No Known Allergies  Patient Measurements: Height: 5\' 4"  (162.6 cm) Weight: 177 lb 0.5 oz (80.3 kg) IBW/kg (Calculated) : 54.7 Heparin Dosing Weight: 72 kg  Vital Signs: Temp: 98.6 F (37 C) (09/08 0500) Temp Source: Oral (09/08 0500) BP: 132/81 (09/08 0907) Pulse Rate: 72 (09/08 0907)  Labs: Recent Labs    02/15/18 1214 02/16/18 0340 02/16/18 0720 02/16/18 1848 02/17/18 0326 02/17/18 0908  HGB 12.7 11.8*  --   --  10.6*  --   HCT 41.1 37.6  --   --  34.0*  --   PLT 256 229  --   --  208  --   LABPROT 14.2 17.2*  --   --  21.7*  --   INR 1.11 1.41  --   --  1.91  --   HEPARINUNFRC  --   --   --  0.11* 0.43 0.50  CREATININE 0.84  --  0.76  --  0.74  --    Estimated Creatinine Clearance: 95.8 mL/min (by C-G formula based on SCr of 0.74 mg/dL).  Medical History: Past Medical History:  Diagnosis Date  . Cystitis 05/2017  . Endocarditis   . Family history of adverse reaction to anesthesia    " mother has a hard time waking "  . Migraine   . Migraines   . SIRS (systemic inflammatory response syndrome) ([place]) 02/15/2018   Assessment: 42 yo F presents with CP. PMH of mechanical AVR on Coumadin 5mg  daily exc for 2.5mg  on Tue/Thurs. Hx of noncompliance. INR 1.11 on admit. CBC stable. Pharmacy consulted to bridge with heparin.  Heparin level remains therapeutic: 0.50 INR up 1.41>1.91 following two boosted doses, will resume PTA dose  Goal of Therapy:  Heparin level 0.3-0.7 INR 2-3 Monitor platelets by anticoagulation protocol: Yes   Plan:  Warfarin 5mg  PO x1 tonight Continue heparin gtt at 1350 units/hr Heparin level daily while on heparin Monitor daily INR, CBC, s/s of bleed  Thanks for allowing pharmacy to be a part of this patient's care.  Georga Bora, PharmD Clinical Pharmacist 02/17/2018 11:43 AM Please check AMION for all [place] numbers

## 2018-02-17 NOTE — Progress Notes (Signed)
ANTICOAGULATION CONSULT NOTE   Pharmacy Consult for Heparin Indication: Mechanical valve  No Known Allergies  Patient Measurements: Height: 5\' 4"  (162.6 cm) Weight: 178 lb 9.2 oz (81 kg) IBW/kg (Calculated) : 54.7 Heparin Dosing Weight: 72 kg  Vital Signs: Temp: 98.6 F (37 C) (09/07 2300) Temp Source: Oral (09/07 2300) BP: 126/81 (09/07 2300) Pulse Rate: 74 (09/07 2300)  Labs: Recent Labs    02/15/18 1214 02/16/18 0340 02/16/18 0720 02/16/18 1848 02/17/18 0326  HGB 12.7 11.8*  --   --  10.6*  HCT 41.1 37.6  --   --  34.0*  PLT 256 229  --   --  208  LABPROT 14.2 17.2*  --   --  21.7*  INR 1.11 1.41  --   --  1.91  HEPARINUNFRC  --   --   --  0.11* 0.43  CREATININE 0.84  --  0.76  --   --    Estimated Creatinine Clearance: 96.2 mL/min (by C-G formula based on SCr of 0.76 mg/dL).  Medical History: Past Medical History:  Diagnosis Date  . Cystitis 05/2017  . Endocarditis   . Family history of adverse reaction to anesthesia    " mother has a hard time waking "  . Migraine   . Migraines   . SIRS (systemic inflammatory response syndrome) ([place]) 02/15/2018   Assessment: 42 yo F presents with CP. PMH of mechanical AVR on Coumadin 5mg  daily exc for 2.5mg  on Tue/Thurs. Hx of noncompliance. INR 1.11 on admit. CBC stable. Pharmacy consulted to bridge with heparin.  Heparin level therapeutic  Goal of Therapy:  Heparin level 0.3-0.7 INR 2-3 Monitor platelets by anticoagulation protocol: Yes   Plan:  Continue Heparin gtt to  1350 units/hr Heparin level in 6 hours and daily while on heparin Monitor daily INR, CBC, s/s of bleed  Thanks for allowing pharmacy to be a part of this patient's care.  Excell Seltzer, PharmD Clinical Pharmacist

## 2018-02-18 ENCOUNTER — Inpatient Hospital Stay (HOSPITAL_COMMUNITY): Payer: 59

## 2018-02-18 DIAGNOSIS — Z952 Presence of prosthetic heart valve: Secondary | ICD-10-CM

## 2018-02-18 DIAGNOSIS — R509 Fever, unspecified: Principal | ICD-10-CM

## 2018-02-18 DIAGNOSIS — R079 Chest pain, unspecified: Secondary | ICD-10-CM

## 2018-02-18 DIAGNOSIS — M79609 Pain in unspecified limb: Secondary | ICD-10-CM

## 2018-02-18 LAB — CBC
HEMATOCRIT: 32.8 % — AB (ref 36.0–46.0)
Hemoglobin: 10.4 g/dL — ABNORMAL LOW (ref 12.0–15.0)
MCH: 28 pg (ref 26.0–34.0)
MCHC: 31.7 g/dL (ref 30.0–36.0)
MCV: 88.4 fL (ref 78.0–100.0)
PLATELETS: 196 10*3/uL (ref 150–400)
RBC: 3.71 MIL/uL — AB (ref 3.87–5.11)
RDW: 12.5 % (ref 11.5–15.5)
WBC: 3.8 10*3/uL — ABNORMAL LOW (ref 4.0–10.5)

## 2018-02-18 LAB — COMPREHENSIVE METABOLIC PANEL
ALBUMIN: 2.7 g/dL — AB (ref 3.5–5.0)
ALT: 11 U/L (ref 0–44)
AST: 15 U/L (ref 15–41)
Alkaline Phosphatase: 39 U/L (ref 38–126)
Anion gap: 7 (ref 5–15)
BILIRUBIN TOTAL: 0.4 mg/dL (ref 0.3–1.2)
BUN: 9 mg/dL (ref 6–20)
CHLORIDE: 111 mmol/L (ref 98–111)
CO2: 20 mmol/L — ABNORMAL LOW (ref 22–32)
CREATININE: 0.79 mg/dL (ref 0.44–1.00)
Calcium: 8.1 mg/dL — ABNORMAL LOW (ref 8.9–10.3)
GFR calc Af Amer: >60 mL/min (ref >60)
GLUCOSE: 107 mg/dL — AB (ref 70–99)
POTASSIUM: 3.8 mmol/L (ref 3.5–5.1)
Sodium: 138 mmol/L (ref 135–145)
Total Protein: 5.8 g/dL — ABNORMAL LOW (ref 6.5–8.1)

## 2018-02-18 LAB — PROTIME-INR
INR: 1.79
PROTHROMBIN TIME: 20.6 s — AB (ref 11.4–15.2)

## 2018-02-18 LAB — HEPARIN LEVEL (UNFRACTIONATED): Heparin Unfractionated: 0.43 IU/mL (ref 0.30–0.70)

## 2018-02-18 MED ORDER — PROCHLORPERAZINE EDISYLATE 10 MG/2ML IJ SOLN: 10.0000 mg | Freq: Every evening | INTRAMUSCULAR | Status: DC | PRN

## 2018-02-18 MED ORDER — WARFARIN SODIUM 7.5 MG PO TABS
7.5000 mg | ORAL_TABLET | Freq: Once | ORAL | Status: AC
  Administered 2018-02-18: 7.5 mg via ORAL
  Filled 2018-02-18: qty 1

## 2018-02-18 MED ORDER — TRAMADOL HCL 50 MG PO TABS
50.0000 mg | ORAL_TABLET | Freq: Four times a day (QID) | ORAL | Status: DC | PRN
  Administered 2018-02-18: 50 mg via ORAL
  Administered 2018-02-20 – 2018-02-21 (×2): 100 mg via ORAL
  Filled 2018-02-18 (×2): qty 2
  Filled 2018-02-18: qty 1

## 2018-02-18 NOTE — Progress Notes (Signed)
[place] for Coumadin/ Heparin Indication: Mechanical valve  No Known Allergies  Patient Measurements: Height: 5\' 4"  (162.6 cm) Weight: 178 lb 12.8 oz (81.1 kg) IBW/kg (Calculated) : 54.7 Heparin Dosing Weight: 72 kg  Vital Signs: Temp: 98.2 F (36.8 C) (09/09 0530) Temp Source: Oral (09/09 0530) BP: 138/85 (09/09 0940) Pulse Rate: 80 (09/09 0943)  Labs: Recent Labs    02/16/18 0340 02/16/18 0720  02/17/18 0326 02/17/18 0908 02/18/18 0528  HGB 11.8*  --   --  10.6*  --  10.4*  HCT 37.6  --   --  34.0*  --  32.8*  PLT 229  --   --  208  --  196  LABPROT 17.2*  --   --  21.7*  --  20.6*  INR 1.41  --   --  1.91  --  1.79  HEPARINUNFRC  --   --    < > 0.43 0.50 0.43  CREATININE  --  0.76  --  0.74  --  0.79   < > = values in this interval not displayed.   Estimated Creatinine Clearance: 96.4 mL/min (by C-G formula based on SCr of 0.79 mg/dL).  Medical History: Past Medical History:  Diagnosis Date  . Cystitis 05/2017  . Endocarditis   . Family history of adverse reaction to anesthesia    " mother has a hard time waking "  . Migraine   . Migraines   . SIRS (systemic inflammatory response syndrome) ([place]) 02/15/2018   Assessment: 42 yo F presents with CP. PMH of mechanical AVR on Coumadin 5mg  daily exc for 2.5mg  on Tue/Thurs. Hx of noncompliance. INR 1.11 on admit. CBC stable. Pharmacy consulted to bridge with heparin.  Heparin level remains therapeutic INR up 1.41>1.91 > 1.79  Goal of Therapy:  Heparin level 0.3-0.7 INR 2-3 Monitor platelets by anticoagulation protocol: Yes   Plan:  Warfarin 7.5mg  PO x1 tonight Continue heparin gtt at 1350 units/hr Heparin level daily while on heparin Monitor daily INR, CBC, s/s of bleed  Thanks for allowing pharmacy to be a part of this patient's care.  Thank you Anette Guarneri, PharmD [phone] 02/18/2018 9:47 AM Please check AMION for all [place] numbers

## 2018-02-18 NOTE — Progress Notes (Addendum)
PROGRESS NOTE    Lummie Montijo  YNW:[id number] DOB: May 11, 1976 DOA: 02/15/2018 PCP: Seward Carol, MD   Brief Narrative:  [name],[name], c/o n/v, and chest pain starting this am. " like sitting on the chest" . Slight dyspnea. + fever, Dry cough. Pt denies palp, diarrhea, brbpr, black stool, dysuria, hematuria. Pt presented to ED, for evaluation of chest pain.    Assessment & Plan:   Principal Problem:   Fever Active Problems:   Tachycardia   Chest pain   Essential hypertension   Intractable migraine without status migrainosus   S/P AVR  1. Fever with Chest Pain Patient had presented with fever, and substernal chest pain history of endocarditis status post aortic valve replacement.  Patient currently not septic.  Patient has been pancultured blood cultures pending.  CT angiogram chest negative for PE or any acute abnormalities.  MRI of the T-spine negative for epidural abscess or any acute abnormalities.  2D echo with EF 55-60%, mechanical prosthesis present, with mean gradient across mechanical AV increased from 20 - 32 mmHg. Sed rate of 13.  Patient may need a transesophageal echocardiogram if blood cultures are positive.  Continue empiric IV vancomycin and IV Rocephin for now.Patient for TEE tomorrow per cardiology.  Cardiology following and appreciate input and recommendations.  2.  Status post aortic valve replacement INR of 1.79.  Coumadin per pharmacy.  Continue aspirin.  3.  Migraine headaches It is noted per RN that cocktail of Toradol, Benadryl, Phenergan helped with patient's migraine headaches.  Continue Toradol as needed, Benadryl as needed.  We will add IV Compazine as needed nightly.  Ultram p.o. as needed.  Will need outpatient follow-up with neurology.   4.  Hypokalemia Replete.  Magnesium of 2.0.  5.  Hypertension Blood pressure stable.  Continue lisinopril HCTZ, metoprolol.     DVT prophylaxis: Coumadin Code Status: Full Family Communication: Updated patient.  No family at bedside. Disposition Plan: To be determined.   Consultants:   Cardiology: Dr. Stanford Breed 02/16/2018  Procedures:   CT angiogram chest 02/15/2018  Chest x-ray 02/15/2018  MRI T-spine 02/15/2018  2D echo 02/17/2018    Antimicrobials:   IV Rocephin 02/15/2018  IV vancomycin 02/15/2018   Subjective: Patient laying in bed.  Denies any shortness of breath.  States chest pain improving.  No nausea or emesis.  Still with some complaints of migraine headaches.    Objective: Vitals:   02/17/18 2134 02/18/18 0530 02/18/18 0940 02/18/18 0943  BP:  (!) 153/90 138/85   Pulse: 76 68  80  Resp:  15    Temp:  98.2 F (36.8 C)    TempSrc:  Oral    SpO2:  100%    Weight:  81.1 kg    Height:        Intake/Output Summary (Last 24 hours) at 02/18/2018 1050 Last data filed at 02/18/2018 0000 Gross per 24 hour  Intake 944.66 ml  Output -  Net 944.66 ml   Filed Weights   02/15/18 2230 02/17/18 0500 02/18/18 0530  Weight: 81 kg 80.3 kg 81.1 kg    Examination:  General exam: NAD Respiratory system: CTAB.  No wheezes, no crackles, no rhonchi.  Respiratory effort normal. Cardiovascular system: Regular rate and rhythm with mechanical click/aortic systolic murmur.  No JVD, no rubs, no gallops.  No lower extremity edema. Gastrointestinal system: Abdomen is soft, nontender, nondistended, positive bowel sounds.  No rebound.  No guarding.   Central nervous system:  Alert and oriented. No focal neurological deficits. Extremities: Symmetric 5 x 5 power. Skin: No rashes, lesions or ulcers Psychiatry: Judgement and insight appear normal. Mood & affect appropriate.     Data Reviewed: I have personally reviewed following labs and imaging studies  CBC: Recent Labs  Lab 02/15/18 1214 02/16/18 0340 02/17/18 0326 02/18/18 0528  WBC 8.2 4.1 3.1* 3.8*  HGB 12.7 11.8* 10.6* 10.4*  HCT 41.1 37.6 34.0*  32.8*  MCV 90.3 87.9 88.8 88.4  PLT 256 229 208 660   Basic Metabolic Panel: Recent Labs  Lab 02/15/18 1214 02/16/18 0720 02/17/18 0326 02/18/18 0528  NA 140 136 137 138  K 3.6 2.9* 3.2* 3.8  CL 110 108 111 111  CO2 21* 21* 21* 20*  GLUCOSE 101* 93 105* 107*  BUN 13 8 7 9   CREATININE 0.84 0.76 0.74 0.79  CALCIUM 8.9 7.5* 7.6* 8.1*  MG  --  1.5* 2.0  --    GFR: Estimated Creatinine Clearance: 96.4 mL/min (by C-G formula based on SCr of 0.79 mg/dL). Liver Function Tests: Recent Labs  Lab 02/16/18 0720 02/17/18 0326 02/18/18 0528  AST 18 17 15   ALT 12 11 11   ALKPHOS 40 55 39  BILITOT 0.8 0.3 0.4  PROT 5.6* 5.7* 5.8*  ALBUMIN 2.7* 2.6* 2.7*   No results for input(s): LIPASE, AMYLASE in the last 168 hours. No results for input(s): AMMONIA in the last 168 hours. Coagulation Profile: Recent Labs  Lab 02/13/18 1531 02/15/18 1214 02/16/18 0340 02/17/18 0326 02/18/18 0528  INR 1.0* 1.11 1.41 1.91 1.79   Cardiac Enzymes: No results for input(s): CKTOTAL, CKMB, CKMBINDEX, TROPONINI in the last 168 hours. BNP (last 3 results) No results for input(s): PROBNP in the last 8760 hours. HbA1C: No results for input(s): HGBA1C in the last 72 hours. CBG: No results for input(s): GLUCAP in the last 168 hours. Lipid Profile: No results for input(s): CHOL, HDL, LDLCALC, TRIG, CHOLHDL, LDLDIRECT in the last 72 hours. Thyroid Function Tests: No results for input(s): TSH, T4TOTAL, FREET4, T3FREE, THYROIDAB in the last 72 hours. Anemia Panel: No results for input(s): VITAMINB12, FOLATE, FERRITIN, TIBC, IRON, RETICCTPCT in the last 72 hours. Sepsis Labs: Recent Labs  Lab 02/15/18 1557 02/15/18 1846  LATICACIDVEN 1.28 1.60    Recent Results (from the past 240 hour(s))  Urine culture     Status: Abnormal   Collection Time: 02/15/18  4:36 PM  Result Value Ref Range Status   Specimen Description URINE, CATHETERIZED  Final   Special Requests   Final    NONE Performed at  [facility], [place] [address], [place], [place] [address]    Culture MULTIPLE SPECIES PRESENT, SUGGEST RECOLLECTION (A)  Final   Report Status 02/16/2018 FINAL  Final  Blood culture (routine x 2)     Status: None (Preliminary result)   Collection Time: 02/15/18  4:39 PM  Result Value Ref Range Status   Specimen Description BLOOD RIGHT ANTECUBITAL  Final   Special Requests   Final    BOTTLES DRAWN AEROBIC AND ANAEROBIC Blood Culture adequate volume   Culture   Final    NO GROWTH 3 DAYS Performed at [facility], [place] [address] [place], [place] [address]    Report Status PENDING  Incomplete  Blood culture (routine x 2)     Status: None (Preliminary result)   Collection Time: 02/15/18  4:44 PM  Result Value Ref Range Status   Specimen Description BLOOD LEFT ANTECUBITAL  Final   Special Requests   Final    BOTTLES DRAWN AEROBIC AND ANAEROBIC Blood Culture adequate volume   Culture   Final    NO GROWTH 3 DAYS Performed at [facility], [address] [place], [place] [address]    Report Status PENDING  Incomplete  MRSA PCR Screening     Status: None   Collection Time: 02/16/18 12:54 AM  Result Value Ref Range Status   MRSA by PCR NEGATIVE NEGATIVE Final    Comment:        The GeneXpert MRSA Assay (FDA approved for NASAL specimens only), is one component of a comprehensive MRSA colonization surveillance program. It is not intended to diagnose MRSA infection nor to guide or monitor treatment for MRSA infections. Performed at [facility], [place] [address], [place], [place] [address]          Radiology Studies: No results found.      Scheduled Meds: . aspirin EC  81 mg Oral Daily  . lisinopril  20 mg Oral Daily   And  . hydrochlorothiazide  12.5 mg Oral Daily  . metoprolol tartrate  25 mg Oral BID  . pantoprazole  40 mg Oral Daily  . warfarin  7.5 mg Oral ONCE-1800  . Warfarin - Pharmacist Dosing Inpatient   Does not apply q1800    Continuous Infusions: . sodium chloride 10 mL/hr at 02/18/18 0000  . cefTRIAXone (ROCEPHIN)  IV 2 g (02/18/18 0945)  . heparin 1,350 Units/hr (02/18/18 0501)  . vancomycin 1,000 mg (02/18/18 0503)     LOS: 3 days    Time spent: 35 minutes    Irine Seal, MD [facility] Pager [phone]  If 7PM-7AM, please contact night-coverage [email or web address] Password [email or web address] 02/18/2018, 10:50 AM

## 2018-02-18 NOTE — Progress Notes (Signed)
Preliminary notes--Bilateral lower extremities venous duplex exam completed.   Negative for DVT where veins can visualize.  Hongying Landry Mellow (RDMS RVT) 02/18/18 4:09 PM

## 2018-02-18 NOTE — Progress Notes (Signed)
Progress Note  Patient Name: Krystal Shaffer Date of Encounter: 02/18/2018  Primary Cardiologist: Peter Martinique, MD   Subjective   Has mild chest heaviness. Resolved fever. No dyspnea.   Inpatient Medications    Scheduled Meds: . aspirin EC  81 mg Oral Daily  . lisinopril  20 mg Oral Daily   And  . hydrochlorothiazide  12.5 mg Oral Daily  . metoprolol tartrate  25 mg Oral BID  . pantoprazole  40 mg Oral Daily  . Warfarin - Pharmacist Dosing Inpatient   Does not apply q1800   Continuous Infusions: . sodium chloride 10 mL/hr at 02/18/18 0000  . cefTRIAXone (ROCEPHIN)  IV Stopped (02/17/18 0931)  . heparin 1,350 Units/hr (02/18/18 0501)  . vancomycin 1,000 mg (02/18/18 0503)   PRN Meds: sodium chloride, acetaminophen, diphenhydrAMINE, ibuprofen, ketorolac, mirtazapine, promethazine   Vital Signs    Vitals:   02/17/18 2134 02/18/18 0530 02/18/18 0940 02/18/18 0943  BP:  (!) 153/90 138/85   Pulse: 76 68  80  Resp:  15    Temp:  98.2 F (36.8 C)    TempSrc:  Oral    SpO2:  100%    Weight:  81.1 kg    Height:        Intake/Output Summary (Last 24 hours) at 02/18/2018 0945 Last data filed at 02/18/2018 0000 Gross per 24 hour  Intake 1064.66 ml  Output -  Net 1064.66 ml   Filed Weights   02/15/18 2230 02/17/18 0500 02/18/18 0530  Weight: 81 kg 80.3 kg 81.1 kg    Telemetry    SR- Personally Reviewed  ECG    N/A  Physical Exam   GEN: No acute distress.   Neck: No JVD Cardiac: RRR, Crisp valve sound with 2/6 systolic murmur, rubs, or gallops.  Respiratory: Clear to auscultation bilaterally. GI: Soft, nontender, non-distended  MS: No edema; No deformity. Neuro:  Nonfocal  Psych: Normal affect   Labs    Chemistry Recent Labs  Lab 02/16/18 0720 02/17/18 0326 02/18/18 0528  NA 136 137 138  K 2.9* 3.2* 3.8  CL 108 111 111  CO2 21* 21* 20*  GLUCOSE 93 105* 107*  BUN 8 7 9   CREATININE 0.76 0.74 0.79  CALCIUM 7.5* 7.6* 8.1*  PROT 5.6* 5.7* 5.8*    ALBUMIN 2.7* 2.6* 2.7*  AST 18 17 15   ALT 12 11 11   ALKPHOS 40 55 39  BILITOT 0.8 0.3 0.4  GFRNONAA >60 >60 >60  GFRAA >60 >60 >60  ANIONGAP 7 5 7      Hematology Recent Labs  Lab 02/16/18 0340 02/17/18 0326 02/18/18 0528  WBC 4.1 3.1* 3.8*  RBC 4.28 3.83* 3.71*  HGB 11.8* 10.6* 10.4*  HCT 37.6 34.0* 32.8*  MCV 87.9 88.8 88.4  MCH 27.6 27.7 28.0  MCHC 31.4 31.2 31.7  RDW 13.1 12.9 12.5  PLT 229 208 196    Cardiac EnzymesNo results for input(s): TROPONINI in the last 168 hours.  Recent Labs  Lab 02/15/18 1223 02/15/18 1551  TROPIPOC 0.00 0.02     BNPNo results for input(s): BNP, PROBNP in the last 168 hours.   DDimer  Recent Labs  Lab 02/15/18 1214  DDIMER 1.13*     Radiology    No results found.  Cardiac Studies   Echo 02/17/18 Study Conclusions  - Left ventricle: The cavity size was normal. Systolic function was   normal. The estimated ejection fraction was in the range of 55%   to 60%.  Wall motion was normal; there were no regional wall   motion abnormalities. Left ventricular diastolic function   parameters were normal. - Aortic valve: A mechanical prosthesis was present. There was   moderate stenosis. There was mild regurgitation. Peak velocity   (S): 375 cm/s. Mean gradient (S): 32 mm Hg. Valve area (VTI):   0.88 cm^2. Valve area (Vmax): 0.86 cm^2. Valve area (Vmean): 0.87   cm^2. - Mitral valve: Transvalvular velocity was within the normal range.   There was no evidence for stenosis. There was no regurgitation. - Right ventricle: The cavity size was normal. Wall thickness was   normal. Systolic function was normal. - Right atrium: The atrium was mildly dilated. - Atrial septum: No defect or patent foramen ovale was identified   by color flow Doppler. - Tricuspid valve: There was mild regurgitation. - Pulmonary arteries: Systolic pressure was within the normal   range. PA peak pressure: 22 mm Hg (S). - Pericardium, extracardiac: A trivial  pericardial effusion was   identified.  Impressions:  - Compared with the echo 09/2017 the mean gradient across the   mechanical aortic valve has increased from 20 mmHg to 32 mmHg.   There are no obvious vegetations. Consider TEE to better   evaluate.  Patient Profile     Krystal Shaffer is a 42 y.o. female with a hx of bicuspid aortic valve with prior aortic valve replacement in the setting of bacterial endocarditis December 2018 admitted with fever and chest pain.   Assessment & Plan     1. Chest pain - Ruled out. Troponin negative. Elevated d-dimer. No PE on CTA of chest with stable aorta. Recent coronary CT in 05/2017 showed calcium score 0 and no coronary disease.  No further ischemia evaluation anticipated.   2. Fever with hx of endocarditis requiring aortic valve replacement in December 2018 - Resolved fever. Sed rate 13. WBC low. Blood cultures are negative to date.   - Echo yesterday showed normal LVEF with increased mean gradient across mechanical valve to 48mm Hg from 71mm Hg. There are no obvious vegetations. Consider TEE to better evaluate. - Will review with MD.   3. Status post aortic valve replacement - Continue Coumadin and aspirin. - As above  4 hypertension -Stable on current medications   5 hypokalemia - Resolved on supplement    For questions or updates, please contact [place] Please consult [email or web address] for contact info under   SignedLeanor Kail, PA  02/18/2018, 9:45 AM

## 2018-02-19 ENCOUNTER — Encounter (HOSPITAL_COMMUNITY)
Admission: EM | Disposition: A | Discharge status: Home / Self Care | Payer: Self-pay | Source: Home / Self Care | Attending: Internal Medicine

## 2018-02-19 ENCOUNTER — Inpatient Hospital Stay (HOSPITAL_COMMUNITY): Payer: 59

## 2018-02-19 ENCOUNTER — Other Ambulatory Visit: Payer: Self-pay

## 2018-02-19 ENCOUNTER — Encounter (HOSPITAL_COMMUNITY): Payer: Self-pay | Admitting: *Deleted

## 2018-02-19 DIAGNOSIS — I340 Nonrheumatic mitral (valve) insufficiency: Secondary | ICD-10-CM

## 2018-02-19 DIAGNOSIS — I351 Nonrheumatic aortic (valve) insufficiency: Secondary | ICD-10-CM

## 2018-02-19 DIAGNOSIS — R6510 Systemic inflammatory response syndrome (SIRS) of non-infectious origin without acute organ dysfunction: Secondary | ICD-10-CM

## 2018-02-19 HISTORY — PX: TEE WITHOUT CARDIOVERSION: SHX5443

## 2018-02-19 LAB — CBC
HEMATOCRIT: 32 % — AB (ref 36.0–46.0)
HEMOGLOBIN: 10.1 g/dL — AB (ref 12.0–15.0)
MCH: 27.8 pg (ref 26.0–34.0)
MCHC: 31.6 g/dL (ref 30.0–36.0)
MCV: 88.2 fL (ref 78.0–100.0)
Platelets: 213 10*3/uL (ref 150–400)
RBC: 3.63 MIL/uL — AB (ref 3.87–5.11)
RDW: 12.5 % (ref 11.5–15.5)
WBC: 4.6 10*3/uL (ref 4.0–10.5)

## 2018-02-19 LAB — COMPREHENSIVE METABOLIC PANEL
ALK PHOS: 39 U/L (ref 38–126)
ALT: 11 U/L (ref 0–44)
ANION GAP: 8 (ref 5–15)
AST: 18 U/L (ref 15–41)
Albumin: 2.8 g/dL — ABNORMAL LOW (ref 3.5–5.0)
BILIRUBIN TOTAL: 0.5 mg/dL (ref 0.3–1.2)
BUN: 11 mg/dL (ref 6–20)
CO2: 22 mmol/L (ref 22–32)
CREATININE: 0.8 mg/dL (ref 0.44–1.00)
Calcium: 8.4 mg/dL — ABNORMAL LOW (ref 8.9–10.3)
Chloride: 106 mmol/L (ref 98–111)
Glucose, Bld: 84 mg/dL (ref 70–99)
Potassium: 3.8 mmol/L (ref 3.5–5.1)
Sodium: 136 mmol/L (ref 135–145)
Total Protein: 6 g/dL — ABNORMAL LOW (ref 6.5–8.1)

## 2018-02-19 LAB — PROTIME-INR
INR: 1.85
PROTHROMBIN TIME: 21.2 s — AB (ref 11.4–15.2)

## 2018-02-19 LAB — HEPARIN LEVEL (UNFRACTIONATED): HEPARIN UNFRACTIONATED: 0.47 [IU]/mL (ref 0.30–0.70)

## 2018-02-19 SURGERY — ECHOCARDIOGRAM, TRANSESOPHAGEAL
Anesthesia: Moderate Sedation

## 2018-02-19 MED ORDER — SODIUM CHLORIDE 0.9 % IV SOLN: INTRAVENOUS | Status: DC

## 2018-02-19 MED ORDER — WARFARIN SODIUM 7.5 MG PO TABS
7.5000 mg | ORAL_TABLET | Freq: Once | ORAL | Status: AC
  Administered 2018-02-19: 7.5 mg via ORAL
  Filled 2018-02-19: qty 1

## 2018-02-19 MED ORDER — MIDAZOLAM HCL 5 MG/ML IJ SOLN
INTRAMUSCULAR | Status: AC
  Filled 2018-02-19: qty 2

## 2018-02-19 MED ORDER — FENTANYL CITRATE (PF) 100 MCG/2ML IJ SOLN
INTRAMUSCULAR | Status: DC | PRN
  Administered 2018-02-19 (×2): 12.5 ug via INTRAVENOUS
  Administered 2018-02-19: 25 ug via INTRAVENOUS
  Administered 2018-02-19: 12.5 ug via INTRAVENOUS

## 2018-02-19 MED ORDER — BUTAMBEN-TETRACAINE-BENZOCAINE 2-2-14 % EX AERO
INHALATION_SPRAY | CUTANEOUS | Status: DC | PRN
  Administered 2018-02-19: 2 via TOPICAL

## 2018-02-19 MED ORDER — KETOROLAC TROMETHAMINE 15 MG/ML IJ SOLN
INTRAMUSCULAR | Status: AC
  Administered 2018-02-19: 30 mg via INTRAVENOUS
  Filled 2018-02-19: qty 2

## 2018-02-19 MED ORDER — PROMETHAZINE HCL 25 MG/ML IJ SOLN
25.0000 mg | Freq: Four times a day (QID) | INTRAMUSCULAR | Status: DC | PRN
  Administered 2018-02-19 – 2018-02-20 (×3): 25 mg via INTRAVENOUS
  Filled 2018-02-19 (×3): qty 1

## 2018-02-19 MED ORDER — MIDAZOLAM HCL 10 MG/2ML IJ SOLN
INTRAMUSCULAR | Status: DC | PRN
  Administered 2018-02-19: 1 mg via INTRAVENOUS
  Administered 2018-02-19: 2 mg via INTRAVENOUS
  Administered 2018-02-19 (×3): 1 mg via INTRAVENOUS

## 2018-02-19 MED ORDER — FENTANYL CITRATE (PF) 100 MCG/2ML IJ SOLN
INTRAMUSCULAR | Status: AC
  Filled 2018-02-19: qty 2

## 2018-02-19 MED ORDER — DIPHENHYDRAMINE HCL 50 MG/ML IJ SOLN
INTRAMUSCULAR | Status: AC
  Filled 2018-02-19: qty 1

## 2018-02-19 NOTE — Progress Notes (Signed)
PROGRESS NOTE    Krystal Shaffer  RFF:[id number] DOB: Apr 25, 1976 DOA: 02/15/2018 PCP: Seward Carol, MD   Brief Narrative:  [name],[name], c/o n/v, and chest pain starting this am. " like sitting on the chest" . Slight dyspnea. + fever, Dry cough. Pt denies palp, diarrhea, brbpr, black stool, dysuria, hematuria. Pt presented to ED, for evaluation of chest pain.    Assessment & Plan:   Principal Problem:   Fever Active Problems:   Tachycardia   Chest pain   Essential hypertension   Intractable migraine without status migrainosus   S/P AVR   SIRS (systemic inflammatory response syndrome) ([place])  1. Fever with Chest Pain Patient had presented with fever, and substernal chest pain history of endocarditis status post aortic valve replacement.  Patient currently not septic.  Patient has been pancultured blood cultures pending.  CT angiogram chest negative for PE or any acute abnormalities.  MRI of the T-spine negative for epidural abscess or any acute abnormalities.  2D echo with EF 55-60%, mechanical prosthesis present, with mean gradient across mechanical AV increased from 20 - 32 mmHg. Sed rate of 13.  Patient s/p transesophageal echocardiogram 02/19/2018 which was negative for vegetations.  Continue empiric IV vancomycin and IV Rocephin.  If blood cultures remain negative could likely discontinue antibiotics.  Cardiology following and appreciate input and recommendations.    2.  Status post aortic valve replacement INR of 1.85.  Coumadin per pharmacy.  Continue aspirin.  3.  Migraine headaches It is noted per RN that cocktail of Toradol, Benadryl, Phenergan helped with patient's migraine headaches.  Continue Toradol as needed, Benadryl as needed.  Discontinue IV Compazine and placed back on IV Phenergan as needed for headache cocktail.  Ultram as needed.  Will likely need outpatient follow-up with neurology  post discharge.   4.  Hypokalemia Repleted.  Magnesium of 2.0.  5.  Hypertension Continue current regimen of lisinopril, HCTZ, metoprolol.     DVT prophylaxis: Coumadin Code Status: Full Family Communication: Updated patient.  No family at bedside. Disposition Plan: To be determined.   Consultants:   Cardiology: Dr. Stanford Breed 02/16/2018  Procedures:   CT angiogram chest 02/15/2018  Chest x-ray 02/15/2018  MRI T-spine 02/15/2018  2D echo 02/17/2018  TEE 02/19/2018 per Dr. Radford Pax  Antimicrobials:   IV Rocephin 02/15/2018  IV vancomycin 02/15/2018   Subjective: Patient in PACU just returned from TEE.  Complaining of headache.  Denies any shortness of breath.   Objective: Vitals:   02/19/18 0920 02/19/18 0925 02/19/18 0930 02/19/18 0938  BP: (!) 116/55 (!) 189/83 (!) 151/74 115/65  Pulse: 71 95 89 78  Resp: 17 (!) 23 (!) 26 19  Temp:      TempSrc:    Oral  SpO2: 100% 100% 100% 100%  Weight:      Height:        Intake/Output Summary (Last 24 hours) at 02/19/2018 0944 Last data filed at 02/19/2018 0700 Gross per 24 hour  Intake 1042.01 ml  Output 4 ml  Net 1038.01 ml   Filed Weights   02/18/18 0530 02/19/18 0653 02/19/18 0816  Weight: 81.1 kg 81.6 kg 81.5 kg    Examination:  General exam: NAD Respiratory system: Lungs clear to auscultation bilaterally.  No wheezes, no crackles, no rhonchi.  Normal respiratory effort.  Cardiovascular system: RRR with mechanical click/aortic systolic murmur.  No JVD, no rubs, no gallops.  No lower extremity edema. Gastrointestinal system: Abdomen is nontender, nondistended,  soft, positive bowel sounds.  No rebound.  No guarding.  Central nervous system: Alert and oriented. No focal neurological deficits. Extremities: Symmetric 5 x 5 power. Skin: No rashes, lesions or ulcers Psychiatry: Judgement and insight appear normal. Mood & affect appropriate.     Data Reviewed: I have personally reviewed following labs and imaging  studies  CBC: Recent Labs  Lab 02/15/18 1214 02/16/18 0340 02/17/18 0326 02/18/18 0528 02/19/18 0657  WBC 8.2 4.1 3.1* 3.8* 4.6  HGB 12.7 11.8* 10.6* 10.4* 10.1*  HCT 41.1 37.6 34.0* 32.8* 32.0*  MCV 90.3 87.9 88.8 88.4 88.2  PLT 256 229 208 196 829   Basic Metabolic Panel: Recent Labs  Lab 02/15/18 1214 02/16/18 0720 02/17/18 0326 02/18/18 0528 02/19/18 0657  NA 140 136 137 138 136  K 3.6 2.9* 3.2* 3.8 3.8  CL 110 108 111 111 106  CO2 21* 21* 21* 20* 22  GLUCOSE 101* 93 105* 107* 84  BUN 13 8 7 9 11   CREATININE 0.84 0.76 0.74 0.79 0.80  CALCIUM 8.9 7.5* 7.6* 8.1* 8.4*  MG  --  1.5* 2.0  --   --    GFR: Estimated Creatinine Clearance: 96.5 mL/min (by C-G formula based on SCr of 0.8 mg/dL). Liver Function Tests: Recent Labs  Lab 02/16/18 0720 02/17/18 0326 02/18/18 0528 02/19/18 0657  AST 18 17 15 18   ALT 12 11 11 11   ALKPHOS 40 55 39 39  BILITOT 0.8 0.3 0.4 0.5  PROT 5.6* 5.7* 5.8* 6.0*  ALBUMIN 2.7* 2.6* 2.7* 2.8*   No results for input(s): LIPASE, AMYLASE in the last 168 hours. No results for input(s): AMMONIA in the last 168 hours. Coagulation Profile: Recent Labs  Lab 02/15/18 1214 02/16/18 0340 02/17/18 0326 02/18/18 0528 02/19/18 0657  INR 1.11 1.41 1.91 1.79 1.85   Cardiac Enzymes: No results for input(s): CKTOTAL, CKMB, CKMBINDEX, TROPONINI in the last 168 hours. BNP (last 3 results) No results for input(s): PROBNP in the last 8760 hours. HbA1C: No results for input(s): HGBA1C in the last 72 hours. CBG: No results for input(s): GLUCAP in the last 168 hours. Lipid Profile: No results for input(s): CHOL, HDL, LDLCALC, TRIG, CHOLHDL, LDLDIRECT in the last 72 hours. Thyroid Function Tests: No results for input(s): TSH, T4TOTAL, FREET4, T3FREE, THYROIDAB in the last 72 hours. Anemia Panel: No results for input(s): VITAMINB12, FOLATE, FERRITIN, TIBC, IRON, RETICCTPCT in the last 72 hours. Sepsis Labs: Recent Labs  Lab 02/15/18 1557  02/15/18 1846  LATICACIDVEN 1.28 1.60    Recent Results (from the past 240 hour(s))  Urine culture     Status: Abnormal   Collection Time: 02/15/18  4:36 PM  Result Value Ref Range Status   Specimen Description URINE, CATHETERIZED  Final   Special Requests   Final    NONE Performed at [facility], [place] [address] [place], [place] [address]    Culture MULTIPLE SPECIES PRESENT, SUGGEST RECOLLECTION (A)  Final   Report Status 02/16/2018 FINAL  Final  Blood culture (routine x 2)     Status: None (Preliminary result)   Collection Time: 02/15/18  4:39 PM  Result Value Ref Range Status   Specimen Description BLOOD RIGHT ANTECUBITAL  Final   Special Requests   Final    BOTTLES DRAWN AEROBIC AND ANAEROBIC Blood Culture adequate volume   Culture   Final    NO GROWTH 4 DAYS Performed at [facility], [place] [address], [place], [place] [address]  Report Status PENDING  Incomplete  Blood culture (routine x 2)     Status: None (Preliminary result)   Collection Time: 02/15/18  4:44 PM  Result Value Ref Range Status   Specimen Description BLOOD LEFT ANTECUBITAL  Final   Special Requests   Final    BOTTLES DRAWN AEROBIC AND ANAEROBIC Blood Culture adequate volume   Culture   Final    NO GROWTH 4 DAYS Performed at [facility], [address], [place], [place] [address]    Report Status PENDING  Incomplete  MRSA PCR Screening     Status: None   Collection Time: 02/16/18 12:54 AM  Result Value Ref Range Status   MRSA by PCR NEGATIVE NEGATIVE Final    Comment:        The GeneXpert MRSA Assay (FDA approved for NASAL specimens only), is one component of a comprehensive MRSA colonization surveillance program. It is not intended to diagnose MRSA infection nor to guide or monitor treatment for MRSA infections. Performed at [facility], [place] [address] [place], [place] [address]          Radiology Studies: No results found.      Scheduled Meds: .  [MAR Hold] aspirin EC  81 mg Oral Daily  . [MAR Hold] lisinopril  20 mg Oral Daily   And  . [MAR Hold] hydrochlorothiazide  12.5 mg Oral Daily  . [MAR Hold] metoprolol tartrate  25 mg Oral BID  . [MAR Hold] pantoprazole  40 mg Oral Daily  . [MAR Hold] Warfarin - Pharmacist Dosing Inpatient   Does not apply q1800   Continuous Infusions: . [MAR Hold] sodium chloride 10 mL/hr at 02/18/18 0000  . sodium chloride    . [MAR Hold] cefTRIAXone (ROCEPHIN)  IV 2 g (02/18/18 0945)  . heparin 1,350 Units/hr (02/18/18 2320)  . [MAR Hold] vancomycin 1,000 mg (02/19/18 0533)     LOS: 4 days    Time spent: 35 minutes    Irine Seal, MD [facility] Pager [phone]  If 7PM-7AM, please contact night-coverage [email or web address] Password [facility] 02/19/2018, 9:44 AM

## 2018-02-19 NOTE — Progress Notes (Signed)
Brief follow up post TEE, as she was sleeping intermittently after the procedure. Discussed the procedure with Dr. Radford Pax, and reviewed images. Does not appear to be obvious infection/involvement of aortic valve on imaging. Reviewed results with patient. Blood cultures appear no growth to date. Will continue to follow.  Buford Dresser, MD, PhD [facility]  [address], [place], [place] [address] [phone]

## 2018-02-19 NOTE — Interval H&P Note (Signed)
History and Physical Interval Note:  02/19/2018 8:41 AM  Krystal Shaffer  has presented today for surgery, with the diagnosis of SEVERE AS  The various methods of treatment have been discussed with the patient and family. After consideration of risks, benefits and other options for treatment, the patient has consented to  Procedure(s): TRANSESOPHAGEAL ECHOCARDIOGRAM (TEE) (N/A) as a surgical intervention .  The patient's history has been reviewed, patient examined, no change in status, stable for surgery.  I have reviewed the patient's chart and labs.  Questions were answered to the patient's satisfaction.     Fransico Him

## 2018-02-19 NOTE — Progress Notes (Signed)
[place] for Coumadin/ Heparin Indication: Mechanical valve  No Known Allergies  Patient Measurements: Height: 5\' 4"  (162.6 cm) Weight: 179 lb 10.8 oz (81.5 kg) IBW/kg (Calculated) : 54.7 Heparin Dosing Weight: 72 kg  Vital Signs: Temp: 98.8 F (37.1 C) (09/10 0938) Temp Source: Oral (09/10 0938) BP: 109/63 (09/10 1000) Pulse Rate: 64 (09/10 1000)  Labs: Recent Labs    02/17/18 0326 02/17/18 0908 02/18/18 0528 02/19/18 0657  HGB 10.6*  --  10.4* 10.1*  HCT 34.0*  --  32.8* 32.0*  PLT 208  --  196 213  LABPROT 21.7*  --  20.6* 21.2*  INR 1.91  --  1.79 1.85  HEPARINUNFRC 0.43 0.50 0.43 0.47  CREATININE 0.74  --  0.79 0.80   Estimated Creatinine Clearance: 96.5 mL/min (by C-G formula based on SCr of 0.8 mg/dL).  Medical History: Past Medical History:  Diagnosis Date  . Cystitis 05/2017  . Endocarditis   . Family history of adverse reaction to anesthesia    " mother has a hard time waking "  . Migraine   . Migraines   . SIRS (systemic inflammatory response syndrome) ([place]) 02/15/2018   Assessment: 42 yo F presents with CP. PMH of mechanical AVR on Coumadin 5mg  daily exc for 2.5mg  on Tue/Thurs. Hx of noncompliance. INR 1.11 on admit. CBC stable. Pharmacy consulted to bridge with heparin.  Heparin level remains therapeutic INR increasing = 1.85 today  Goal of Therapy:  Heparin level 0.3-0.7 INR 2-3 Monitor platelets by anticoagulation protocol: Yes   Plan:  Repeat Warfarin 7.5mg  PO x1 tonight Continue heparin gtt at 1350 units/hr Heparin level daily while on heparin Monitor daily INR, CBC, s/s of bleed  Thanks for allowing pharmacy to be a part of this patient's care.  Thank you Anette Guarneri, PharmD [phone] 02/19/2018 10:20 AM Please check AMION for all [facility] numbers

## 2018-02-19 NOTE — CV Procedure (Addendum)
    PROCEDURE NOTE:  Procedure:  Transesophageal echocardiogram Operator:  Fransico Him, MD Indications:  Aortic stenosis Complications: None  During this procedure the patient is administered a total of Versed 6 mg and Fentanyl 62.5 mg to achieve and maintain moderate conscious sedation.  The patient's heart rate, blood pressure, and oxygen saturation are monitored continuously during the procedure. The period of conscious sedation is 20 minutes, of which I was present face-to-face 100% of this time.  Results: Normal LV size and function EF 55-60% Normal RV size and function Normal RA Normal LA and LA appendage Normal TV Normal PV with trivial PR Normal MV with mild MR Stable Mechanical AV prosthesis with no rocking motion, no visible vegetation, normal leaflet motion and no perivalvular AI Normal interatrial septum with no evidence of shunt by colorflow dopper  Normal thoracic and ascending aorta.  The patient tolerated the procedure well and was transferred back to their room in stable condition.  Signed: Fransico Him, MD [facility]

## 2018-02-20 ENCOUNTER — Encounter: Payer: 59 | Admitting: Cardiothoracic Surgery

## 2018-02-20 ENCOUNTER — Encounter (HOSPITAL_COMMUNITY): Payer: Self-pay | Admitting: Cardiology

## 2018-02-20 DIAGNOSIS — Z5181 Encounter for therapeutic drug level monitoring: Secondary | ICD-10-CM

## 2018-02-20 DIAGNOSIS — G43819 Other migraine, intractable, without status migrainosus: Secondary | ICD-10-CM

## 2018-02-20 DIAGNOSIS — Z7901 Long term (current) use of anticoagulants: Secondary | ICD-10-CM

## 2018-02-20 LAB — COMPREHENSIVE METABOLIC PANEL
ALT: 18 U/L (ref 0–44)
AST: 20 U/L (ref 15–41)
Albumin: 2.8 g/dL — ABNORMAL LOW (ref 3.5–5.0)
Alkaline Phosphatase: 45 U/L (ref 38–126)
Anion gap: 6 (ref 5–15)
BILIRUBIN TOTAL: 0.4 mg/dL (ref 0.3–1.2)
BUN: 11 mg/dL (ref 6–20)
CHLORIDE: 109 mmol/L (ref 98–111)
CO2: 23 mmol/L (ref 22–32)
Calcium: 8.3 mg/dL — ABNORMAL LOW (ref 8.9–10.3)
Creatinine, Ser: 0.88 mg/dL (ref 0.44–1.00)
Glucose, Bld: 91 mg/dL (ref 70–99)
POTASSIUM: 3.9 mmol/L (ref 3.5–5.1)
Sodium: 138 mmol/L (ref 135–145)
TOTAL PROTEIN: 6 g/dL — AB (ref 6.5–8.1)

## 2018-02-20 LAB — CULTURE, BLOOD (ROUTINE X 2)
CULTURE: NO GROWTH
Culture: NO GROWTH
Special Requests: ADEQUATE
Special Requests: ADEQUATE

## 2018-02-20 LAB — CBC
HEMATOCRIT: 32.2 % — AB (ref 36.0–46.0)
Hemoglobin: 10.3 g/dL — ABNORMAL LOW (ref 12.0–15.0)
MCH: 27.9 pg (ref 26.0–34.0)
MCHC: 32 g/dL (ref 30.0–36.0)
MCV: 87.3 fL (ref 78.0–100.0)
PLATELETS: 235 10*3/uL (ref 150–400)
RBC: 3.69 MIL/uL — ABNORMAL LOW (ref 3.87–5.11)
RDW: 12.4 % (ref 11.5–15.5)
WBC: 4.8 10*3/uL (ref 4.0–10.5)

## 2018-02-20 LAB — PROTIME-INR
INR: 1.77
Prothrombin Time: 20.5 seconds — ABNORMAL HIGH (ref 11.4–15.2)

## 2018-02-20 LAB — HEPARIN LEVEL (UNFRACTIONATED): HEPARIN UNFRACTIONATED: 0.41 [IU]/mL (ref 0.30–0.70)

## 2018-02-20 MED ORDER — ENOXAPARIN SODIUM 80 MG/0.8ML ~~LOC~~ SOLN: 80.0000 mg | Freq: Two times a day (BID) | SUBCUTANEOUS | Status: DC

## 2018-02-20 MED ORDER — ENOXAPARIN SODIUM 80 MG/0.8ML ~~LOC~~ SOLN
80.0000 mg | Freq: Once | SUBCUTANEOUS | Status: AC
  Administered 2018-02-20: 80 mg via SUBCUTANEOUS
  Filled 2018-02-20: qty 0.8

## 2018-02-20 MED ORDER — ENOXAPARIN SODIUM 80 MG/0.8ML ~~LOC~~ SOLN
80.0000 mg | Freq: Once | SUBCUTANEOUS | Status: AC
  Administered 2018-02-21: 80 mg via SUBCUTANEOUS
  Filled 2018-02-20: qty 0.8

## 2018-02-20 MED ORDER — WARFARIN SODIUM 5 MG PO TABS
9.0000 mg | ORAL_TABLET | Freq: Once | ORAL | Status: AC
  Administered 2018-02-20: 9 mg via ORAL
  Filled 2018-02-20: qty 1

## 2018-02-20 NOTE — Care Management (Signed)
01-20-18 BENEFITS CHECK :  #  6.   S/W   LYNN @  [facility]  # [phone]  1. LOVENOX   80 MG SQ Q 12H FOR 5 DAYS COVER- YES CO-PAY- $ 15.00 TIER- 3 DRUG PRIOR APPROVAL- NO  2. ENOXAPARIN  COVER- YES CO-PAY- $ 11.00 TIER- 2 DRUG PRIOR APPROVAL- NO  PREFERRED PHARMACY : YES [facility]  AND [facility]

## 2018-02-20 NOTE — Plan of Care (Signed)
  Problem: Education: Goal: Knowledge of General Education information will improve Description Including pain rating scale, medication(s)/side effects and non-pharmacologic comfort measures Outcome: Completed/Met   Problem: Clinical Measurements: Goal: Respiratory complications will improve Outcome: Completed/Met   Problem: Activity: Goal: Risk for activity intolerance will decrease Outcome: Completed/Met   Problem: Nutrition: Goal: Adequate nutrition will be maintained Outcome: Completed/Met

## 2018-02-20 NOTE — Discharge Instructions (Signed)

## 2018-02-20 NOTE — Progress Notes (Addendum)
ANTICOAGULATION CONSULT NOTE  Pharmacy Consult:  Heparin / Coumadin Indication: Mechanical valve  No Known Allergies  Patient Measurements: Height: 5\' 4"  (162.6 cm) Weight: 179 lb 12.8 oz (81.6 kg) IBW/kg (Calculated) : 54.7 Heparin Dosing Weight: 72 kg  Vital Signs: Temp: 98.4 F (36.9 C) (09/11 0415) Temp Source: Oral (09/11 0415) BP: 135/90 (09/11 0415) Pulse Rate: 74 (09/11 0415)  Labs: Recent Labs    02/18/18 0528 02/19/18 0657 02/20/18 0357  HGB 10.4* 10.1* 10.3*  HCT 32.8* 32.0* 32.2*  PLT 196 213 235  LABPROT 20.6* 21.2* 20.5*  INR 1.79 1.85 1.77  HEPARINUNFRC 0.43 0.47 0.41  CREATININE 0.79 0.80 0.88   Estimated Creatinine Clearance: 87.9 mL/min (by C-G formula based on SCr of 0.88 mg/dL).  Assessment: 42 yo F presents with CP. PMH of mechanical AVR on Coumadin 5mg  daily except for 2.5mg  on Tue/Thurs. History of noncompliance.  INR sub-therapeutic on admit and IV heparin bridge was started.     Heparin level is therapeutic and stable.  INR is sub-therapeutic and dips down slightly.  No bleeding reported.   Goal of Therapy:  Heparin level 0.3-0.7 units/mL INR 2-3 Monitor platelets by anticoagulation protocol: Yes      Plan:  Continue heparin gtt at 1350 units/hr  Coumadin 9mg  PO today Daily PT / INR, heparin level and CBC  F/U with discontinuation of abx  Cortlyn Cannell D. Mina Marble, PharmD, BCPS, [place] 02/20/2018, 12:14 PM   ================================  Addendum: Switch from IV heparin to SQ Lovenox Renal function stable INR goal changed to 2.5 - 3.5 per MD (outpatient [facility] clinic aimed for 2 - 3)   Plan: Stop IV heparin now One hour later, start Lovenox 80mg  SQ Q12H CBC Q72H while on Lovenox   Tywone Bembenek D. Mina Marble, PharmD, BCPS, [place] 02/20/2018, 1:18 PM

## 2018-02-20 NOTE — Progress Notes (Signed)
Progress Note  Patient Name: Krystal Shaffer Date of Encounter: 02/20/2018  Primary Cardiologist: Peter Martinique, MD   Subjective   Doing well, other than persistent migraine. No chest pain today. We spent time discussing the next steps (see below) and plans going forward. All questions answered.  Inpatient Medications    Scheduled Meds: . aspirin EC  81 mg Oral Daily  . enoxaparin (LOVENOX) injection  80 mg Subcutaneous Once  . [START ON 02/21/2018] enoxaparin (LOVENOX) injection  80 mg Subcutaneous Once  . [START ON 02/21/2018] enoxaparin (LOVENOX) injection  80 mg Subcutaneous Q12H  . lisinopril  20 mg Oral Daily   And  . hydrochlorothiazide  12.5 mg Oral Daily  . metoprolol tartrate  25 mg Oral BID  . pantoprazole  40 mg Oral Daily  . warfarin  9 mg Oral ONCE-1800  . Warfarin - Pharmacist Dosing Inpatient   Does not apply q1800   Continuous Infusions: . sodium chloride Stopped (02/19/18 0813)   PRN Meds: sodium chloride, acetaminophen, diphenhydrAMINE, ibuprofen, ketorolac, mirtazapine, promethazine, traMADol   Vital Signs    Vitals:   02/19/18 2125 02/20/18 0415 02/20/18 0500 02/20/18 1016  BP: 137/90 135/90    Pulse: 80 74  74  Resp: 17 17    Temp: 98.7 F (37.1 C) 98.4 F (36.9 C)    TempSrc: Oral Oral    SpO2: 100% 100%    Weight:   81.6 kg   Height:        Intake/Output Summary (Last 24 hours) at 02/20/2018 1404 Last data filed at 02/20/2018 1300 Gross per 24 hour  Intake 1598.12 ml  Output -  Net 1598.12 ml   Filed Weights   02/19/18 0653 02/19/18 0816 02/20/18 0500  Weight: 81.6 kg 81.5 kg 81.6 kg    Telemetry    SR- Personally Reviewed  ECG    No new  Physical Exam   GEN: No acute distress.   Neck: No JVD Cardiac: RRR, Crisp valve sound with 2/6 systolic murmur, rubs, or gallops.  Respiratory: Clear to auscultation bilaterally. GI: Soft, nontender, non-distended  MS: No edema; No deformity. Neuro:  Nonfocal  Psych: Normal affect    Labs    Chemistry Recent Labs  Lab 02/18/18 0528 02/19/18 0657 02/20/18 0357  NA 138 136 138  K 3.8 3.8 3.9  CL 111 106 109  CO2 20* 22 23  GLUCOSE 107* 84 91  BUN 9 11 11   CREATININE 0.79 0.80 0.88  CALCIUM 8.1* 8.4* 8.3*  PROT 5.8* 6.0* 6.0*  ALBUMIN 2.7* 2.8* 2.8*  AST 15 18 20   ALT 11 11 18   ALKPHOS 39 39 45  BILITOT 0.4 0.5 0.4  GFRNONAA >60 >60 >60  GFRAA >60 >60 >60  ANIONGAP 7 8 6      Hematology Recent Labs  Lab 02/18/18 0528 02/19/18 0657 02/20/18 0357  WBC 3.8* 4.6 4.8  RBC 3.71* 3.63* 3.69*  HGB 10.4* 10.1* 10.3*  HCT 32.8* 32.0* 32.2*  MCV 88.4 88.2 87.3  MCH 28.0 27.8 27.9  MCHC 31.7 31.6 32.0  RDW 12.5 12.5 12.4  PLT 196 213 235    Cardiac EnzymesNo results for input(s): TROPONINI in the last 168 hours.  Recent Labs  Lab 02/15/18 1223 02/15/18 1551  TROPIPOC 0.00 0.02     BNPNo results for input(s): BNP, PROBNP in the last 168 hours.   DDimer  Recent Labs  Lab 02/15/18 1214  DDIMER 1.13*     Radiology    No results  found.  Cardiac Studies   Echo 02/17/18 Study Conclusions  - Left ventricle: The cavity size was normal. Systolic function was   normal. The estimated ejection fraction was in the range of 55%   to 60%. Wall motion was normal; there were no regional wall   motion abnormalities. Left ventricular diastolic function   parameters were normal. - Aortic valve: A mechanical prosthesis was present. There was   moderate stenosis. There was mild regurgitation. Peak velocity   (S): 375 cm/s. Mean gradient (S): 32 mm Hg. Valve area (VTI):   0.88 cm^2. Valve area (Vmax): 0.86 cm^2. Valve area (Vmean): 0.87   cm^2. - Mitral valve: Transvalvular velocity was within the normal range.   There was no evidence for stenosis. There was no regurgitation. - Right ventricle: The cavity size was normal. Wall thickness was   normal. Systolic function was normal. - Right atrium: The atrium was mildly dilated. - Atrial septum: No  defect or patent foramen ovale was identified   by color flow Doppler. - Tricuspid valve: There was mild regurgitation. - Pulmonary arteries: Systolic pressure was within the normal   range. PA peak pressure: 22 mm Hg (S). - Pericardium, extracardiac: A trivial pericardial effusion was   identified.  Impressions:  - Compared with the echo 09/2017 the mean gradient across the   mechanical aortic valve has increased from 20 mmHg to 32 mmHg.   There are no obvious vegetations. Consider TEE to better   evaluate.  TEE 02/19/18 Results: Normal LV size and function EF 55-60% Normal RV size and function Normal RA Normal LA and LA appendage Normal TV Normal PV with trivial PR Normal MV with mild MR Stable Mechanical AV prosthesis with no rocking motion, no visible vegetation, normal leaflet motion and no perivalvular AI Normal interatrial septum with no evidence of shunt by colorflow dopper  Normal thoracic and ascending aorta.  Patient Profile     Kortne All is a 42 y.o. female with a hx of bicuspid aortic valve with prior aortic valve replacement in the setting of bacterial endocarditis December 2018 admitted with fever and chest pain.  Assessment & Plan    1. Chest pain - Ruled out. Troponin negative. Elevated d-dimer. No PE on CTA of chest with stable aorta. Recent coronary CT in 05/2017 showed calcium score 0 and no coronary disease.  No further ischemia evaluation.  -question whether chest pain is related to aortic valve gradient (see below)  2. Fever with hx of endocarditis requiring aortic valve replacement in December 2018 - Resolved fever. Sed rate 13. WBC low. Blood cultures are negative to date.  Empiric antibiotics stopped. - Echo showed normal LVEF with increased mean gradient across mechanical valve to 39mm Hg from 60mm Hg. There are no obvious vegetations.  - TEE on 9/10 without abscess or vegetation. No evidence of endocarditis.   3. Status post aortic valve  replacement - Continue Coumadin and aspirin. INR was subtherapeutic on admission (POCT was 1.0 on 02/13/18). Her INR is not yet therapeutic, goal usually 2-3 for AVR. Given concern for new increased gradient, reasonable to aim for 2.5-3.5 for a short term and reimage valve. Medicine team plans to change to SQ lovenox and bridge to therapeutic INR. Appreciate pharmacy assistance with management. - gradient has increased from 72mmHg to 32 mmHg. On my personal review of imaging, valve appears well seated, both leaflets open appropriately. No obvious pannus that I can see. CTPE done for other indication, but I cannot see  obvious thrombus on the valve. Concern with increased gradient would be thrombus, pannus, mismatch. Given acuity of symptoms and subtherapeutic INR, thrombus most concerning, but appears to open well on TEE. No evidence of vegetation. - once therapeutic and stable on INR, consider limited echo to repeat gradient. - she has follow up schedule with Dr. Prescott Gum and Dr. Martinique already scheduled. If her symptoms persist, may need to discuss additional testing to assess valve gradient and function.  4 hypertension -Stable on current medications   5. Migraine: per medicine team, appreciate their management  [facility] will sign off in anticipation of possible discharge tomorrow.   Medication Recommendations: continue aspirin 81 mg, coumadin/lovenox bridge, amoxicillin prophylaxis, lisinopril-hctz, metoprolol Other recommendations (labs, testing, etc): outpatient INR monitoring Follow up as an outpatient:  Has scheduled follow up with Dr. Martinique and Dr. Prescott Gum.  For questions or updates, please contact [place] Please consult [email or web address] for contact info under   Signed, Buford Dresser, MD  02/20/2018, 2:04 PM

## 2018-02-20 NOTE — Progress Notes (Signed)
PROGRESS NOTE    Krystal Shaffer  PPI:[id number] DOB: [birth year] DOA: 02/15/2018 PCP: Seward Carol, MD   Brief Narrative:  [name],[name], c/o n/v, and chest pain starting this am. " like sitting on the chest" . Slight dyspnea. + fever, Dry cough. Pt denies palp, diarrhea, brbpr, black stool, dysuria, hematuria. Pt presented to ED, for evaluation of chest pain.    Assessment & Plan:   Principal Problem:   Fever Active Problems:   Tachycardia   Chest pain   Essential hypertension   Intractable migraine without status migrainosus   S/P AVR   SIRS (systemic inflammatory response syndrome) ([place])  1. Fever with Chest Pain -Patient presented with fever, and substernal chest pain history of endocarditis status post aortic valve replacement.   -Krystal Shaffer no septic on examination and has remained afebrile for over 48 hours. -TEE negative for vegetations. -Negative blood cultures. -Will discontinue antibiotics and observe patient for 24 hours off abx's. -CT Angio of the chest is negative for pulmonary embolisms or any acute cardiopulmonary process.  -Patient also had MRI of the T-spine which is negative for epidural abscess or osteomyelitis.  2.  Status post aortic valve replacement -INR of 1.77. -Coumadin per pharmacy.   -Continue aspirin. -Will discontinue heparin drip and use Lovenox instead.  3.  Migraine headaches -It is noted per RN that cocktail of Toradol, Benadryl, Phenergan helped with patient's migraine headaches.   -Patient denies any headaches currently -Will continue Toradol, Benadryl and Phenergan as needed.   -Outpatient follow-up with neurology service to further address long-term therapy of migraines.  4.  Hypokalemia -Electrolytes has been repleted and are within normal limits currently. -Magnesium level also stable.  5.  Hypertension -Is stable and well-controlled -Continue  lisinopril, HCTZ and metoprolol.     DVT prophylaxis: Coumadin/bridging with Lovenox. Code Status: Full Family Communication: No family at bedside. Disposition Plan: Remains inpatient, will discontinue IV antibiotics and observe for 24 hours.  Heparin drip will be also discontinued and the patient will be placed on Lovenox with anticipated discharge home on dual therapy in case her INR is not therapeutic.   Consultants:   Cardiology: Dr. Stanford Breed 02/16/2018  Procedures:   CT angiogram chest 02/15/2018  Chest x-ray 02/15/2018  MRI T-spine 02/15/2018  2D echo 02/17/2018  TEE 02/19/2018 per Dr. Radford Pax  Antimicrobials:   IV Rocephin 02/15/2018  IV vancomycin 02/15/2018   Subjective: Patient in no acute distress.  Denies chest pain, palpitations or shortness of breath currently.  Objective: Vitals:   02/19/18 2125 02/20/18 0415 02/20/18 0500 02/20/18 1016  BP: 137/90 135/90    Pulse: 80 74  74  Resp: 17 17    Temp: 98.7 F (37.1 C) 98.4 F (36.9 C)    TempSrc: Oral Oral    SpO2: 100% 100%    Weight:   81.6 kg   Height:        Intake/Output Summary (Last 24 hours) at 02/20/2018 1309 Last data filed at 02/20/2018 0800 Gross per 24 hour  Intake 1417.09 ml  Output -  Net 1417.09 ml   Filed Weights   02/19/18 0653 02/19/18 0816 02/20/18 0500  Weight: 81.6 kg 81.5 kg 81.6 kg    Examination: General exam: Alert, awake, oriented x 3; has remained afebrile for the last 48 hours and is in no acute distress.  Patient denies chest pain, nausea, vomiting, abdominal pain and shortness of breath. Respiratory system: Clear to auscultation. Respiratory effort normal. Cardiovascular  system:RRR.  Positive mechanical click on auscultation, positive systolic murmur; no JVD, no rubs, no gallops.   Gastrointestinal system: Abdomen is nondistended, soft and nontender. No organomegaly or masses felt. Normal bowel sounds heard. Central nervous system: Alert and oriented. No focal neurological  deficits. Extremities: No C/C/E, +pedal pulses Skin: No rashes, lesions or ulcers Psychiatry: Judgement and insight appear normal. Mood & affect appropriate.   Data Reviewed: I have personally reviewed following labs and imaging studies  CBC: Recent Labs  Lab 02/16/18 0340 02/17/18 0326 02/18/18 0528 02/19/18 0657 02/20/18 0357  WBC 4.1 3.1* 3.8* 4.6 4.8  HGB 11.8* 10.6* 10.4* 10.1* 10.3*  HCT 37.6 34.0* 32.8* 32.0* 32.2*  MCV 87.9 88.8 88.4 88.2 87.3  PLT 229 208 196 213 417   Basic Metabolic Panel: Recent Labs  Lab 02/16/18 0720 02/17/18 0326 02/18/18 0528 02/19/18 0657 02/20/18 0357  NA 136 137 138 136 138  K 2.9* 3.2* 3.8 3.8 3.9  CL 108 111 111 106 109  CO2 21* 21* 20* 22 23  GLUCOSE 93 105* 107* 84 91  BUN 8 7 9 11 11   CREATININE 0.76 0.74 0.79 0.80 0.88  CALCIUM 7.5* 7.6* 8.1* 8.4* 8.3*  MG 1.5* 2.0  --   --   --    GFR: Estimated Creatinine Clearance: 87.9 mL/min (by C-G formula based on SCr of 0.88 mg/dL).   Liver Function Tests: Recent Labs  Lab 02/16/18 0720 02/17/18 0326 02/18/18 0528 02/19/18 0657 02/20/18 0357  AST 18 17 15 18 20   ALT 12 11 11 11 18   ALKPHOS 40 55 39 39 45  BILITOT 0.8 0.3 0.4 0.5 0.4  PROT 5.6* 5.7* 5.8* 6.0* 6.0*  ALBUMIN 2.7* 2.6* 2.7* 2.8* 2.8*   Coagulation Profile: Recent Labs  Lab 02/16/18 0340 02/17/18 0326 02/18/18 0528 02/19/18 0657 02/20/18 0357  INR 1.41 1.91 1.79 1.85 1.77   Sepsis Labs: Recent Labs  Lab 02/15/18 1557 02/15/18 1846  LATICACIDVEN 1.28 1.60    Recent Results (from the past 240 hour(s))  Urine culture     Status: Abnormal   Collection Time: 02/15/18  4:36 PM  Result Value Ref Range Status   Specimen Description URINE, CATHETERIZED  Final   Special Requests   Final    NONE Performed at [facility], [place] [address] [place], [place] [address]    Culture MULTIPLE SPECIES PRESENT, SUGGEST RECOLLECTION (A)  Final   Report Status 02/16/2018 FINAL  Final  Blood culture  (routine x 2)     Status: None   Collection Time: 02/15/18  4:39 PM  Result Value Ref Range Status   Specimen Description BLOOD RIGHT ANTECUBITAL  Final   Special Requests   Final    BOTTLES DRAWN AEROBIC AND ANAEROBIC Blood Culture adequate volume   Culture   Final    NO GROWTH 5 DAYS Performed at [facility], [place] [address] [place], [place] [address]    Report Status 02/20/2018 FINAL  Final  Blood culture (routine x 2)     Status: None   Collection Time: 02/15/18  4:44 PM  Result Value Ref Range Status   Specimen Description BLOOD LEFT ANTECUBITAL  Final   Special Requests   Final    BOTTLES DRAWN AEROBIC AND ANAEROBIC Blood Culture adequate volume   Culture   Final    NO GROWTH 5 DAYS Performed at [facility], [place] [address], [place], [place] [address]    Report Status 02/20/2018 FINAL  Final  MRSA PCR Screening     Status: None   Collection Time: 02/16/18 12:54 AM  Result Value Ref Range Status   MRSA by PCR NEGATIVE NEGATIVE Final    Comment:        The GeneXpert MRSA Assay (FDA approved for NASAL specimens only), is one component of a comprehensive MRSA colonization surveillance program. It is not intended to diagnose MRSA infection nor to guide or monitor treatment for MRSA infections. Performed at [facility], [place] [address], [place], [place] [address]      Scheduled Meds: . aspirin EC  81 mg Oral Daily  . lisinopril  20 mg Oral Daily   And  . hydrochlorothiazide  12.5 mg Oral Daily  . metoprolol tartrate  25 mg Oral BID  . pantoprazole  40 mg Oral Daily  . warfarin  9 mg Oral ONCE-1800  . Warfarin - Pharmacist Dosing Inpatient   Does not apply q1800   Continuous Infusions: . sodium chloride Stopped (02/19/18 0813)  . heparin 1,350 Units/hr (02/20/18 0700)     LOS: 5 days    Time spent: 30 minutes    Barton Dubois, MD [facility] Pager [phone]  If 7PM-7AM, please contact night-coverage [email or web address] Password  [email or web address] 02/20/2018, 1:09 PM

## 2018-02-21 DIAGNOSIS — R6510 Systemic inflammatory response syndrome (SIRS) of non-infectious origin without acute organ dysfunction: Secondary | ICD-10-CM

## 2018-02-21 LAB — PROTIME-INR
INR: 2.23
PROTHROMBIN TIME: 24.5 s — AB (ref 11.4–15.2)

## 2018-02-21 MED ORDER — ACETAMINOPHEN 325 MG PO TABS: 650.0000 mg | ORAL_TABLET | Freq: Four times a day (QID) | ORAL | 0 refills | Status: AC | PRN

## 2018-02-21 MED ORDER — WARFARIN SODIUM 7.5 MG PO TABS: 7.5000 mg | ORAL_TABLET | Freq: Every day | ORAL | 0 refills | Status: DC

## 2018-02-21 MED ORDER — ENOXAPARIN SODIUM 80 MG/0.8ML ~~LOC~~ SOLN: 80.0000 mg | Freq: Two times a day (BID) | SUBCUTANEOUS | 0 refills | Status: DC

## 2018-02-21 MED ORDER — TRAMADOL HCL 50 MG PO TABS: 50.0000 mg | ORAL_TABLET | Freq: Four times a day (QID) | ORAL | 0 refills | Status: DC | PRN

## 2018-02-21 MED ORDER — WARFARIN SODIUM 7.5 MG PO TABS: 7.5000 mg | ORAL_TABLET | Freq: Every day | ORAL | Status: DC

## 2018-02-21 MED ORDER — PANTOPRAZOLE SODIUM 40 MG PO TBEC: 40.0000 mg | DELAYED_RELEASE_TABLET | Freq: Every day | ORAL | 1 refills | Status: DC

## 2018-02-21 NOTE — Care Management Note (Signed)
Case Management Note  Patient Details  Name: Krystal Shaffer MRN: [id number] Date of Birth: Jan 23, 1976  Subjective/Objective:   Pt presented for chest pain, dyspnea and fever. PTA Independent from home. Plan will be to transition home on Lovenox injections. Benefits Check Completed and pt is aware of cost.                 Action/Plan: CM did call [facility] and they have Lovenox 80 mg in stock.   No further needs from CM at this time.   Expected Discharge Date:  02/21/18               Expected Discharge Plan:  Home/Self Care  In-House Referral:  NA  Discharge planning Services  CM Consult, Medication Assistance  Post Acute Care Choice:  NA Choice offered to:  NA  DME Arranged:  N/A DME Agency:  NA  HH Arranged:  NA HH Agency:  NA  Status of Service:  Completed, signed off  If discussed at [place] of Stay Meetings, dates discussed:    Additional Comments:  Bethena Roys, RN 02/21/2018, 11:26 AM

## 2018-02-21 NOTE — Discharge Summary (Signed)
Physician Discharge Summary  Krystal Shaffer JOA:[id number] DOB: [birth year] DOA: 02/15/2018  PCP: Seward Carol, MD  Admit date: 02/15/2018 Discharge date: 02/21/2018  Time spent: 35 minutes  Recommendations for Outpatient Follow-up:  1. Repeat BMET to follow electrolytes and renal function trend. 2. Repeat INR and to assure stability and goal on INR level 3. Reassess BP and adjust antihypertensive regimen as needed 4. Please arrange outpatient follow up with neurology for further evaluation and treatment of migraines.   Discharge Diagnoses:  Principal Problem:   Fever Active Problems:   Tachycardia   Chest pain   Essential hypertension   Intractable migraine without status migrainosus   S/P AVR   SIRS (systemic inflammatory response syndrome) (HCC)   Subtherapeutic anticoagulation   Discharge Condition: stable and improved. Discharge home with instruction sto follow up with PCP in 10 days. Patient will also follow up at coumadin clinic on 9/16 and has scheduled visit with cardiology and cardio thoracic surgery service.   Diet recommendation: heart healthy diet   Filed Weights   02/19/18 0816 02/20/18 0500 02/21/18 0446  Weight: 81.5 kg 81.6 kg 81.3 kg    Brief History of present illness:  [name],[name], c/o n/v, and chest pain starting this am. " like sitting on the chest" . Slight dyspnea. + fever, Dry cough. Pt denies palp, diarrhea, brbpr, black stool, dysuria, hematuria. Pt presented to ED, for evaluation of chest pain.  Hospital Course:  1. SIRS/Fever with Chest Pain -Patient presented with fever, and substernal chest pain, history of endocarditis status post aortic valve replacement.   -patient is no septic on examination and has remained afebrile for over 60 hours prior to discharge; 24 hours afebrile w/o antibiotic therapy. -patient was treated for 5 days with IV vancomycin and IV  Rocephin -TEE neg for vegetations and cultures w/o any growth. -CT Angio of the chest is negative for pulmonary embolisms or any acute cardiopulmonary process. -Patient also had MRI of the T-spine which is negative for epidural abscess or osteomyelitis. -outpatient follow up with cardiology service and cardiothoracic surgery service.  2.  Status post aortic valve replacement -INR of 2.2. -Coumadin dose recommended by pharmacy was 7.5 at discharge and follow up with coumadin clinic on 9/16.   -Continue aspirin. -lovenox for bridging until follow up with coumadin clinic -INR goal is 2.5-3.5  3.  Migraine headaches -It is noted per RN that cocktail of Toradol, Benadryl, Phenergan helped with patient's migraine headaches. -PRN tramadol for severe pain prescribed at discharge.   -Patient denies any headaches currently -Outpatient follow-up with neurology service to further address long-term therapy of migraines. -side effects experienced in the past with usage of topamax.  4.  Hypokalemia -Electrolytes has been repleted and are within normal limits currently. -Magnesium level also stable. -repeat BMET at follow up visit to follow electrolytes trend.   5.  Hypertension -Is stable and well-controlled -Continue lisinopril, HCTZ and metoprolol.   -heart healthy diet and medication compliance encouraged.  Procedures:  CT angiogram chest 02/15/2018  Chest x-ray 02/15/2018  MRI T-spine 02/15/2018  2D echo 02/17/2018  TEE 02/19/2018 per Dr. Radford Pax  Consultations:  Cardiology   Discharge Exam: Vitals:   02/20/18 2024 02/21/18 0446  BP: 116/67 (!) 113/58  Pulse: 79 64  Resp: 20 15  Temp: 98.3 F (36.8 C) 98.1 F (36.7 C)  SpO2: 100% 100%   General exam: Alert, awake, oriented x 3; has remained afebrile for the last 48 hours  and is in no acute distress.  Patient denies chest pain, nausea, vomiting, abdominal pain and shortness of breath. Respiratory system: Clear to auscultation.  Respiratory effort normal. Cardiovascular system:RRR.  Positive mechanical click on auscultation, positive systolic murmur; no JVD, no rubs, no gallops.   Gastrointestinal system: Abdomen is nondistended, soft and nontender. No organomegaly or masses felt. Normal bowel sounds heard. Central nervous system: Alert and oriented. No focal neurological deficits. Extremities: No C/C/E, +pedal pulses Skin: No rashes, lesions or ulcers Psychiatry: Judgement and insight appear normal. Mood & affect appropriate.   Discharge Instructions   Discharge Instructions    Diet - low sodium heart healthy   Complete by:  As directed    Discharge instructions   Complete by:  As directed    Take medications as prescribed  Arrange follow up with PCP in 10 days Follow up with coumadin clinic 9/16 Maintain adequate hydration  Follow heart healthy diet     Allergies as of 02/21/2018   No Known Allergies     Medication List    STOP taking these medications   EXCEDRIN EXTRA STRENGTH 250-250-65 MG tablet Generic drug:  aspirin-acetaminophen-caffeine   lisinopril 10 MG tablet Commonly known as:  PRINIVIL,ZESTRIL   mirtazapine 30 MG tablet Commonly known as:  REMERON     TAKE these medications   acetaminophen 325 MG tablet Commonly known as:  TYLENOL Take 2 tablets (650 mg total) by mouth every 6 (six) hours as needed for mild pain, fever or headache.   amoxicillin 500 MG tablet Commonly known as:  AMOXIL Take 2,000 mg by mouth See admin instructions. Take 4 tablets (2000 mg) by mouth one hour prior to dental appointments   aspirin 81 MG EC tablet Take 1 tablet (81 mg total) by mouth daily.   enoxaparin 80 MG/0.8ML injection Commonly known as:  LOVENOX Inject 0.8 mLs (80 mg total) into the skin every 12 (twelve) hours for 5 days.   lisinopril-hydrochlorothiazide 20-12.5 MG tablet Commonly known as:  PRINZIDE,ZESTORETIC Take 1 tablet by mouth daily.   medroxyPROGESTERone 150 MG/ML  injection Commonly known as:  DEPO-PROVERA Inject 150 mg into the muscle every 3 (three) months.   metoprolol tartrate 25 MG tablet Commonly known as:  LOPRESSOR Take 1 tablet (25 mg total) by mouth 2 (two) times daily.   pantoprazole 40 MG tablet Commonly known as:  PROTONIX Take 1 tablet (40 mg total) by mouth daily. Start taking on:  02/22/2018   traMADol 50 MG tablet Commonly known as:  ULTRAM Take 1-2 tablets (50-100 mg total) by mouth every 6 (six) hours as needed for severe pain.   warfarin 7.5 MG tablet Commonly known as:  COUMADIN Take as directed. If you are unsure how to take this medication, talk to your nurse or doctor. Original instructions:  Take 1 tablet (7.5 mg total) by mouth daily at 6 PM. What changed:    medication strength  how much to take  how to take this  when to take this  additional instructions      No Known Allergies Follow-up Information    Seward Carol, MD. Schedule an appointment as soon as possible for a visit in 10 day(s).   Specialty:  Internal Medicine Contact information: [address] [place] [address] [phone]        Martinique, Peter M, MD .   Specialty:  Cardiology Contact information: [address] [place] [address] [phone]  The results of significant diagnostics from this hospitalization (including imaging, microbiology, ancillary and laboratory) are listed below for reference.    Significant Diagnostic Studies: Dg Chest 2 View  Result Date: 02/15/2018 CLINICAL DATA:  Sudden onset of mid sternal chest pain with shortness of breath while at work there is associated nausea and vomiting and lightheadedness. History of aortic valve replacement last year. EXAM: CHEST - 2 VIEW COMPARISON:  PA and lateral chest x-ray of December 12, 2017 and chest CT scan of the same day FINDINGS: The lungs are well-expanded and clear. The heart and pulmonary vascularity are normal. There  is a prosthetic aortic valve. There is no pleural effusion. The sternal wires are intact. IMPRESSION: There is no active cardiopulmonary disease. Electronically Signed   By: David  Martinique M.D.   On: 02/15/2018 12:36   Ct Angio Chest Pe W And/or Wo Contrast  Result Date: 02/15/2018 CLINICAL DATA:  Midline upper chest pain, shortness of breath. History of pericarditis resulting in a valve replacement in 2018. EXAM: CT ANGIOGRAPHY CHEST WITH CONTRAST TECHNIQUE: Multidetector CT imaging of the chest was performed using the standard protocol during bolus administration of intravenous contrast. Multiplanar CT image reconstructions and MIPs were obtained to evaluate the vascular anatomy. CONTRAST:  65 cc ISOVUE-370 IOPAMIDOL (ISOVUE-370) INJECTION 76% COMPARISON:  Chest CT angiogram dated 12/12/2017. FINDINGS: Cardiovascular: There is no pulmonary embolism identified within the main, lobar or segmental pulmonary arteries bilaterally, although evaluation of the most peripheral segmental and subsegmental pulmonary arteries is somewhat limited by patient body habitus and mild patient breathing motion artifact. No thoracic aortic aneurysm or evidence of aortic dissection appreciated. Heart size is within normal limits. No pericardial effusion. Aortic valve hardware in place. Mediastinum/Nodes: No mass or enlarged lymph nodes appreciated within the mediastinum or perihilar regions. Esophagus appears normal. Trachea and central bronchi are unremarkable. Lungs/Pleura: Lungs are clear.  No pleural effusion or pneumothorax. Upper Abdomen: Limited images of the upper abdomen are unremarkable. Musculoskeletal: No acute or significant osseous abnormality. Median sternotomy wires in place. Review of the MIP images confirms the above findings. IMPRESSION: 1. No acute findings. No pulmonary embolism seen, with mild study limitations detailed above. No evidence of pneumonia or pulmonary edema. No pericardial effusion. 2. No evidence of  surgical complicating feature. Electronically Signed   By: Franki Cabot M.D.   On: 02/15/2018 14:15   Mr Thoracic Spine W Wo Contrast  Result Date: 02/15/2018 CLINICAL DATA:  Mid back pain.  History of endocarditis. EXAM: MRI THORACIC WITHOUT AND WITH CONTRAST TECHNIQUE: Multiplanar and multiecho pulse sequences of the thoracic spine were obtained without and with intravenous contrast. CONTRAST:  8 mL Gadavist COMPARISON:  None. FINDINGS: MRI THORACIC SPINE FINDINGS Alignment:  Normal Vertebrae: Normal marrow signal throughout the thoracic spine. Cord:  Normal signal and caliber. Paraspinal and other soft tissues: The visualized paraspinal and retroperitoneal structures are normal. Disc levels: There is no disc herniation, spinal canal stenosis or nerve root impingement. There is no epidural abscess or fluid collection. No abnormal contrast enhancement. IMPRESSION: Normal thoracic spine MRI.  No epidural abscess. Electronically Signed   By: Ulyses Jarred M.D.   On: 02/15/2018 21:52    Microbiology: Recent Results (from the past 240 hour(s))  Urine culture     Status: Abnormal   Collection Time: 02/15/18  4:36 PM  Result Value Ref Range Status   Specimen Description URINE, CATHETERIZED  Final   Special Requests   Final    NONE Performed at [facility]  Hospital Lab, [place] [address] [place], [place] [address]    Culture MULTIPLE SPECIES PRESENT, SUGGEST RECOLLECTION (A)  Final   Report Status 02/16/2018 FINAL  Final  Blood culture (routine x 2)     Status: None   Collection Time: 02/15/18  4:39 PM  Result Value Ref Range Status   Specimen Description BLOOD RIGHT ANTECUBITAL  Final   Special Requests   Final    BOTTLES DRAWN AEROBIC AND ANAEROBIC Blood Culture adequate volume   Culture   Final    NO GROWTH 5 DAYS Performed at [facility], [place] [address] [place], [place] [address]    Report Status 02/20/2018 FINAL  Final  Blood culture (routine x 2)     Status: None   Collection Time:  02/15/18  4:44 PM  Result Value Ref Range Status   Specimen Description BLOOD LEFT ANTECUBITAL  Final   Special Requests   Final    BOTTLES DRAWN AEROBIC AND ANAEROBIC Blood Culture adequate volume   Culture   Final    NO GROWTH 5 DAYS Performed at [facility], [place] [address], [place], [place] [address]    Report Status 02/20/2018 FINAL  Final  MRSA PCR Screening     Status: None   Collection Time: 02/16/18 12:54 AM  Result Value Ref Range Status   MRSA by PCR NEGATIVE NEGATIVE Final    Comment:        The GeneXpert MRSA Assay (FDA approved for NASAL specimens only), is one component of a comprehensive MRSA colonization surveillance program. It is not intended to diagnose MRSA infection nor to guide or monitor treatment for MRSA infections. Performed at [facility], [place] [address] [place], [place] [address]      Labs: Basic Metabolic Panel: Recent Labs  Lab 02/16/18 0720 02/17/18 0326 02/18/18 0528 02/19/18 0657 02/20/18 0357  NA 136 137 138 136 138  K 2.9* 3.2* 3.8 3.8 3.9  CL 108 111 111 106 109  CO2 21* 21* 20* 22 23  GLUCOSE 93 105* 107* 84 91  BUN 8 7 9 11 11   CREATININE 0.76 0.74 0.79 0.80 0.88  CALCIUM 7.5* 7.6* 8.1* 8.4* 8.3*  MG 1.5* 2.0  --   --   --    Liver Function Tests: Recent Labs  Lab 02/16/18 0720 02/17/18 0326 02/18/18 0528 02/19/18 0657 02/20/18 0357  AST 18 17 15 18 20   ALT 12 11 11 11 18   ALKPHOS 40 55 39 39 45  BILITOT 0.8 0.3 0.4 0.5 0.4  PROT 5.6* 5.7* 5.8* 6.0* 6.0*  ALBUMIN 2.7* 2.6* 2.7* 2.8* 2.8*   CBC: Recent Labs  Lab 02/16/18 0340 02/17/18 0326 02/18/18 0528 02/19/18 0657 02/20/18 0357  WBC 4.1 3.1* 3.8* 4.6 4.8  HGB 11.8* 10.6* 10.4* 10.1* 10.3*  HCT 37.6 34.0* 32.8* 32.0* 32.2*  MCV 87.9 88.8 88.4 88.2 87.3  PLT 229 208 196 213 235   Signed:  Barton Dubois MD.  [facility] 02/21/2018, 10:37 AM

## 2018-02-21 NOTE — Progress Notes (Signed)
ANTICOAGULATION CONSULT NOTE  Pharmacy Consult:  Lovenox / Coumadin Indication: Mechanical valve  No Known Allergies  Patient Measurements: Height: 5\' 4"  (162.6 cm) Weight: 179 lb 4.8 oz (81.3 kg) IBW/kg (Calculated) : 54.7 Heparin Dosing Weight: 72 kg  Vital Signs: Temp: 98.1 F (36.7 C) (09/12 0446) Temp Source: Oral (09/12 0446) BP: 113/58 (09/12 0446) Pulse Rate: 64 (09/12 0446)  Labs: Recent Labs    02/19/18 0657 02/20/18 0357 02/21/18 0603  HGB 10.1* 10.3*  --   HCT 32.0* 32.2*  --   PLT 213 235  --   LABPROT 21.2* 20.5* 24.5*  INR 1.85 1.77 2.23  HEPARINUNFRC 0.47 0.41  --   CREATININE 0.80 0.88  --    Estimated Creatinine Clearance: 87.6 mL/min (by C-G formula based on SCr of 0.88 mg/dL).  Assessment: 42 yo F presents with CP. PMH of mechanical AVR on Coumadin 5mg  daily except for 2.5mg  on Tue/Thurs. History of noncompliance.  INR sub-therapeutic on admit and IV heparin bridge was started.     INR = 2.23  Goal of Therapy:  INR 2-3  (goal of 2.5 to 3.5 for short term) Monitor platelets by anticoagulation protocol: Yes      Plan:  Lovenox 80 mg sq Q 12 hours Warfarin 7.5 mg po daily Daily INR  Thank you Anette Guarneri, PharmD [phone]   02/21/2018, 10:01 AM

## 2018-02-25 ENCOUNTER — Ambulatory Visit (INDEPENDENT_AMBULATORY_CARE_PROVIDER_SITE_OTHER): Payer: 59 | Admitting: Pharmacist

## 2018-02-25 DIAGNOSIS — Z952 Presence of prosthetic heart valve: Secondary | ICD-10-CM | POA: Diagnosis not present

## 2018-02-25 DIAGNOSIS — Z5181 Encounter for therapeutic drug level monitoring: Secondary | ICD-10-CM | POA: Diagnosis not present

## 2018-02-25 LAB — POCT INR: INR: 2.6 (ref 2.0–3.0)

## 2018-02-27 ENCOUNTER — Encounter: Payer: 59 | Admitting: Cardiothoracic Surgery

## 2018-03-06 ENCOUNTER — Ambulatory Visit (INDEPENDENT_AMBULATORY_CARE_PROVIDER_SITE_OTHER): Payer: 59 | Admitting: Cardiothoracic Surgery

## 2018-03-06 ENCOUNTER — Encounter: Payer: Self-pay | Admitting: Cardiothoracic Surgery

## 2018-03-06 ENCOUNTER — Other Ambulatory Visit: Payer: Self-pay | Admitting: *Deleted

## 2018-03-06 ENCOUNTER — Other Ambulatory Visit: Payer: Self-pay

## 2018-03-06 VITALS — BP 126/88 | HR 88 | Resp 18 | Ht 64.0 in | Wt 186.2 lb

## 2018-03-06 DIAGNOSIS — Z952 Presence of prosthetic heart valve: Secondary | ICD-10-CM

## 2018-03-06 DIAGNOSIS — I739 Peripheral vascular disease, unspecified: Secondary | ICD-10-CM

## 2018-03-06 NOTE — Progress Notes (Signed)
PCP is Seward Carol, MD Referring Provider is Martinique, Peter M, MD  Chief Complaint  Patient presents with  . Follow-up    f/u, s/p AVR 06/04/17  . Aortic Stenosis    HPI: 57-month follow-up after AVR for aortic valve endocarditis.  Patient had a 21 mm [employer] mechanical valve.  She was recently hospitalized for possible sepsis- SIRS and had an echo.  The aortic valve had no vegetations, no perivalvular leak.  The estimated transvalvular gradient was overestimated which is common for that particular valve plus the patient was possibly septic.  I reviewed the images both transthoracic and trans-esophageal and feel her valve is functioning well.  She is taking her Coumadin is recommended.  She is now back at work.  Patient complains of claudication type pain in her left calf.  This is reproducible.  Ultrasound of the leg showed no DVT when she was hospitalized earlier this month.  She did not have ABIs performed prior to her aortic valve replacement 10 months ago. Her lower extremity pulses are trace palpable so we will proceed with rest and exercise ABIs.  She will return the office after that study is completed.  The patient is reassured that her mechanical AVR is functioning  Past Medical History:  Diagnosis Date  . Cystitis 05/2017  . Endocarditis   . Family history of adverse reaction to anesthesia    " mother has a hard time waking "  . Migraine   . Migraines   . SIRS (systemic inflammatory response syndrome) ([place]) 02/15/2018    Past Surgical History:  Procedure Laterality Date  . AORTIC VALVE REPLACEMENT N/A 06/04/2017   Procedure: AORTIC VALVE REPLACEMENT (AVR);  Surgeon: Prescott Gum, Collier Salina, MD;  Location: [facility];  Service: Open Heart Surgery;  Laterality: N/A;  . MULTIPLE EXTRACTIONS WITH ALVEOLOPLASTY N/A 05/31/2017   Procedure: Extraction of tooth  #'s [phone] with alveoloplasty and gross debridement of remaining dentition;  Surgeon: Lenn Cal, DDS;  Location:  [facility];  Service: Oral Surgery;  Laterality: N/A;  . TEE WITHOUT CARDIOVERSION N/A 05/29/2017   Procedure: TRANSESOPHAGEAL ECHOCARDIOGRAM (TEE);  Surgeon: Jerline Pain, MD;  Location: [facility] ENDOSCOPY;  Service: Cardiovascular;  Laterality: N/A;  . TEE WITHOUT CARDIOVERSION N/A 06/04/2017   Procedure: TRANSESOPHAGEAL ECHOCARDIOGRAM (TEE);  Surgeon: Prescott Gum, Collier Salina, MD;  Location: [facility];  Service: Open Heart Surgery;  Laterality: N/A;  . TEE WITHOUT CARDIOVERSION N/A 02/19/2018   Procedure: TRANSESOPHAGEAL ECHOCARDIOGRAM (TEE);  Surgeon: Sueanne Margarita, MD;  Location[facility] ENDOSCOPY;  Service: Cardiovascular;  Laterality: N/A;  . WISDOM TOOTH EXTRACTION      Family History  Problem Relation Age of Onset  . Obesity Mother   . Hypertension Sister   . Cirrhosis Father     Social History Social History   Tobacco Use  . Smoking status: Former Research scientist (life sciences)  . Smokeless tobacco: Never Used  Substance Use Topics  . Alcohol use: Yes    Comment: occasional  . Drug use: No    Current Outpatient Medications  Medication Sig Dispense Refill  . acetaminophen (TYLENOL) 325 MG tablet Take 2 tablets (650 mg total) by mouth every 6 (six) hours as needed for mild pain, fever or headache. 40 tablet 0  . amoxicillin (AMOXIL) 500 MG tablet Take 2,000 mg by mouth See admin instructions. Take 4 tablets (2000 mg) by mouth one hour prior to dental appointments  5  . aspirin EC 81 MG EC tablet Take 1 tablet (81 mg total) by mouth  daily.    . lisinopril-hydrochlorothiazide (PRINZIDE,ZESTORETIC) 20-12.5 MG tablet Take 1 tablet by mouth daily.  4  . medroxyPROGESTERone (DEPO-PROVERA) 150 MG/ML injection Inject 150 mg into the muscle every 3 (three) months.    . metoprolol tartrate (LOPRESSOR) 25 MG tablet Take 1 tablet (25 mg total) by mouth 2 (two) times daily. 60 tablet 4  . pantoprazole (PROTONIX) 40 MG tablet Take 1 tablet (40 mg total) by mouth daily. 30 tablet 1  . warfarin (COUMADIN) 7.5 MG tablet Take 1 tablet  (7.5 mg total) by mouth daily at 6 PM. (Patient taking differently: Take 5 mg by mouth daily at 6 PM. ) 10 tablet 0   No current facility-administered medications for this visit.     No Known Allergies  Review of Systems   No headache or chest pain currently Claudication type pain with exercise in the left lower leg BP 126/88 (BP Location: Right Arm, Patient Position: Sitting, Cuff Size: Normal)   Pulse 88   Resp 18   Ht 5\' 4"  (1.626 m)   Wt 186 lb 3.2 oz (84.5 kg)   SpO2 99% Comment: RA  BMI 31.96 kg/m  Physical Exam      Exam    General- alert and comfortable    Neck- no JVD, no cervical adenopathy palpable, no carotid bruit   Lungs- clear without rales, wheezes   Cor- regular rate and rhythm, no murmur , gallop   Abdomen- soft, non-tender   Extremities - warm, non-tender, minimal edema   Neuro- oriented, appropriate, no focal weakness   Diagnostic Tests: Echocardiogram images personally reviewed showing normal functioning of the 21 mm [employer] mechanical AVR  Impression: Return after ABIs to discuss possible vascular disease of the lower extremities.  Continue Coumadin as recommended. Importance of dental antibiotic prophylaxis emphasized to the patient Plan: Return in 2 weeks to discuss ABIs-claudication   Len Childs, MD [facility] [phone]

## 2018-03-15 ENCOUNTER — Inpatient Hospital Stay (HOSPITAL_COMMUNITY): Admission: RE | Admit: 2018-03-15 | Payer: 59 | Source: Ambulatory Visit

## 2018-03-15 ENCOUNTER — Other Ambulatory Visit: Payer: Self-pay | Admitting: Pharmacist

## 2018-03-15 MED ORDER — WARFARIN SODIUM 5 MG PO TABS: 5.0000 mg | ORAL_TABLET | Freq: Every day | ORAL | 0 refills | Status: DC

## 2018-03-22 ENCOUNTER — Ambulatory Visit (HOSPITAL_COMMUNITY)
Admission: RE | Admit: 2018-03-22 | Discharge: 2018-03-22 | Disposition: A | Discharge status: Home / Self Care | Payer: 59 | Source: Ambulatory Visit | Attending: Cardiothoracic Surgery | Admitting: Cardiothoracic Surgery

## 2018-03-22 ENCOUNTER — Ambulatory Visit (INDEPENDENT_AMBULATORY_CARE_PROVIDER_SITE_OTHER): Payer: 59 | Admitting: Pharmacist

## 2018-03-22 DIAGNOSIS — I739 Peripheral vascular disease, unspecified: Secondary | ICD-10-CM

## 2018-03-22 DIAGNOSIS — Z952 Presence of prosthetic heart valve: Secondary | ICD-10-CM

## 2018-03-22 DIAGNOSIS — Z5181 Encounter for therapeutic drug level monitoring: Secondary | ICD-10-CM

## 2018-03-22 LAB — POCT INR: INR: 2.3 (ref 2.0–3.0)

## 2018-03-27 ENCOUNTER — Encounter: Payer: 59 | Admitting: Cardiothoracic Surgery

## 2018-03-27 NOTE — Progress Notes (Signed)
This encounter was created in error - please disregard.

## 2018-04-17 ENCOUNTER — Encounter: Payer: 59 | Admitting: Cardiothoracic Surgery

## 2018-04-17 ENCOUNTER — Other Ambulatory Visit: Payer: Self-pay

## 2018-04-18 MED ORDER — WARFARIN SODIUM 5 MG PO TABS: 5.0000 mg | ORAL_TABLET | Freq: Every day | ORAL | 1 refills | Status: DC

## 2018-04-19 ENCOUNTER — Telehealth: Payer: Self-pay | Admitting: Cardiology

## 2018-04-19 ENCOUNTER — Other Ambulatory Visit: Payer: Self-pay | Admitting: Pharmacist Clinician (PhC)/ Clinical Pharmacy Specialist

## 2018-04-19 MED ORDER — WARFARIN SODIUM 5 MG PO TABS: 5.0000 mg | ORAL_TABLET | Freq: Every day | ORAL | 0 refills | Status: DC

## 2018-04-19 NOTE — Telephone Encounter (Signed)
done

## 2018-04-19 NOTE — Telephone Encounter (Signed)
New message    *STAT* If patient is at the pharmacy, call can be transferred to refill team.   1. Which medications need to be refilled? (please list name of each medication and dose if known) warfarin (COUMADIN) 5 MG tablet **  2. Which pharmacy/location (including street and city if local pharmacy) is medication to be sent to?[facility] [place], [place] AT [address]  3. Do they need a 30 day or 90 day supply?[place]

## 2018-05-08 ENCOUNTER — Telehealth: Payer: Self-pay

## 2018-05-08 NOTE — Telephone Encounter (Signed)
Called pt to schedule overdue inr left msg 

## 2018-05-15 ENCOUNTER — Ambulatory Visit (INDEPENDENT_AMBULATORY_CARE_PROVIDER_SITE_OTHER): Payer: 59 | Admitting: Cardiothoracic Surgery

## 2018-05-15 ENCOUNTER — Encounter: Payer: Self-pay | Admitting: Cardiothoracic Surgery

## 2018-05-15 ENCOUNTER — Other Ambulatory Visit: Payer: Self-pay

## 2018-05-15 VITALS — BP 101/71 | HR 97 | Resp 18 | Ht 64.0 in | Wt 205.4 lb

## 2018-05-15 DIAGNOSIS — Z952 Presence of prosthetic heart valve: Secondary | ICD-10-CM | POA: Diagnosis not present

## 2018-05-15 NOTE — Progress Notes (Signed)
PCP is Seward Carol, MD Referring Provider is Martinique, Taryll Reichenberger M, MD  Chief Complaint  Patient presents with  . Follow-up    f/u after ABIs, s/p AVR    HPI: Patient returns for discussion of results of lower extremity Dopplers performed because of left leg claudication.  ABI measured at 0.5 on the left.  She is status post AVR for severe aortic insufficiency 1 year ago for endocarditis.  She has a mechanical valve and is on chronic Coumadin and aspirin 81 mg.  She works full-time for [employer] tries to walk about a mile in the parking lot on her break.  She develops calf and leg fatigue but pushes through it when possible.  She is in sinus rhythm and her last echo showed normal functioning valve with good LV function.  She does not smoke.  She is a nondiabetic  Past Medical History:  Diagnosis Date  . Cystitis 05/2017  . Endocarditis   . Family history of adverse reaction to anesthesia    " mother has a hard time waking "  . Migraine   . Migraines   . SIRS (systemic inflammatory response syndrome) ([place]) 02/15/2018    Past Surgical History:  Procedure Laterality Date  . AORTIC VALVE REPLACEMENT N/A 06/04/2017   Procedure: AORTIC VALVE REPLACEMENT (AVR);  Surgeon: Prescott Gum, Collier Salina, MD;  Location: [place];  Service: Open Heart Surgery;  Laterality: N/A;  . MULTIPLE EXTRACTIONS WITH ALVEOLOPLASTY N/A 05/31/2017   Procedure: Extraction of tooth  #'s [phone] with alveoloplasty and gross debridement of remaining dentition;  Surgeon: Lenn Cal, DDS;  Location: [place];  Service: Oral Surgery;  Laterality: N/A;  . TEE WITHOUT CARDIOVERSION N/A 05/29/2017   Procedure: TRANSESOPHAGEAL ECHOCARDIOGRAM (TEE);  Surgeon: Jerline Pain, MD;  Location: [facility] ENDOSCOPY;  Service: Cardiovascular;  Laterality: N/A;  . TEE WITHOUT CARDIOVERSION N/A 06/04/2017   Procedure: TRANSESOPHAGEAL ECHOCARDIOGRAM (TEE);  Surgeon: Prescott Gum, Collier Salina, MD;  Location: [place];  Service: Open Heart Surgery;   Laterality: N/A;  . TEE WITHOUT CARDIOVERSION N/A 02/19/2018   Procedure: TRANSESOPHAGEAL ECHOCARDIOGRAM (TEE);  Surgeon: Sueanne Margarita, MD;  Location: [facility] ENDOSCOPY;  Service: Cardiovascular;  Laterality: N/A;  . WISDOM TOOTH EXTRACTION      Family History  Problem Relation Age of Onset  . Obesity Mother   . Hypertension Sister   . Cirrhosis Father     Social History Social History   Tobacco Use  . Smoking status: Former Research scientist (life sciences)  . Smokeless tobacco: Never Used  Substance Use Topics  . Alcohol use: Yes    Comment: occasional  . Drug use: No    Current Outpatient Medications  Medication Sig Dispense Refill  . acetaminophen (TYLENOL) 325 MG tablet Take 2 tablets (650 mg total) by mouth every 6 (six) hours as needed for mild pain, fever or headache. 40 tablet 0  . amoxicillin (AMOXIL) 500 MG tablet Take 2,000 mg by mouth See admin instructions. Take 4 tablets (2000 mg) by mouth one hour prior to dental appointments  5  . aspirin EC 81 MG EC tablet Take 1 tablet (81 mg total) by mouth daily.    Marland Kitchen lisinopril-hydrochlorothiazide (PRINZIDE,ZESTORETIC) 20-12.5 MG tablet Take 1 tablet by mouth daily.  4  . metoprolol tartrate (LOPRESSOR) 25 MG tablet Take 1 tablet (25 mg total) by mouth 2 (two) times daily. 60 tablet 4  . warfarin (COUMADIN) 5 MG tablet Take 1 tablet (5 mg total) by mouth daily. 90 tablet 0   No  current facility-administered medications for this visit.     No Known Allergies  Review of Systems   Daily symptoms of left leg claudication  BP 101/71 (BP Location: Left Arm, Patient Position: Sitting, Cuff Size: Large)   Pulse 97   Resp 18   Ht 5\' 4"  (1.626 m)   Wt 205 lb 6.4 oz (93.2 kg)   SpO2 97% Comment: RA  BMI 35.26 kg/m  Physical Exam Alert and comfortable Soft flow murmur through aortic valve with normal aortic valve closure click Sinus rhythm Lungs clear No palpable left pedal pulses but no edema.  Foot is warm.  Diagnostic Tests: ABI results  reviewed with patient.  Impression: 42 year old woman with claudication pain that has developed since aortic valve replacement for endocarditis.  At the time of surgery she did not have this complaint.  Because of her young age she did not have ABIs performed prior to AVR in December 2018.  Since they are abnormal and she has classic symptoms of claudication I will refer her to  [facility] for evaluation and follow-up.  Plan: Plan on seeing the patient back in a year for her annual follow-up status post AVR.   She understands importance of antibiotic prophylaxis before any dental work. Len Childs, MD [facility] [phone]

## 2018-06-21 NOTE — Telephone Encounter (Signed)
Called pt to schedule overdue inr left msg 

## 2018-06-24 NOTE — Telephone Encounter (Signed)
Called pt to schedule overdue inr for 1/17 8am

## 2018-06-28 ENCOUNTER — Ambulatory Visit (INDEPENDENT_AMBULATORY_CARE_PROVIDER_SITE_OTHER): Payer: 59 | Admitting: *Deleted

## 2018-06-28 DIAGNOSIS — Z5181 Encounter for therapeutic drug level monitoring: Secondary | ICD-10-CM | POA: Diagnosis not present

## 2018-06-28 DIAGNOSIS — Z952 Presence of prosthetic heart valve: Secondary | ICD-10-CM

## 2018-06-28 LAB — POCT INR: INR: 3.5 — AB (ref 2.0–3.0)

## 2018-06-28 NOTE — Patient Instructions (Signed)
Description   Skip today's dose, then Continue with 5mg  tablet daily.  Repeat INR in 2 weeks

## 2018-07-30 ENCOUNTER — Encounter: Payer: Self-pay | Admitting: Family

## 2018-07-30 ENCOUNTER — Encounter: Payer: 59 | Admitting: Vascular Surgery

## 2018-08-14 ENCOUNTER — Telehealth: Payer: Self-pay

## 2018-08-14 NOTE — Telephone Encounter (Signed)
Called and left a msg stating that we need to schedule coumadin appt

## 2018-08-15 ENCOUNTER — Other Ambulatory Visit: Payer: Self-pay | Admitting: Pharmacist Clinician (PhC)/ Clinical Pharmacy Specialist

## 2018-08-15 MED ORDER — METOPROLOL TARTRATE 25 MG PO TABS: 25.0000 mg | ORAL_TABLET | Freq: Two times a day (BID) | ORAL | 1 refills | Status: DC

## 2018-08-16 ENCOUNTER — Other Ambulatory Visit: Payer: Self-pay

## 2018-08-16 ENCOUNTER — Other Ambulatory Visit: Payer: Self-pay | Admitting: Pharmacist Clinician (PhC)/ Clinical Pharmacy Specialist

## 2018-08-16 IMAGING — DX DG ORTHOPANTOGRAM /PANORAMIC
1 series · 1 of 1 positions shown · non-contrast
Comparison: None.

CLINICAL DATA: Encounter for bacteremia.

EXAM:
ORTHOPANTOGRAM/PANORAMIC

[view not recorded]
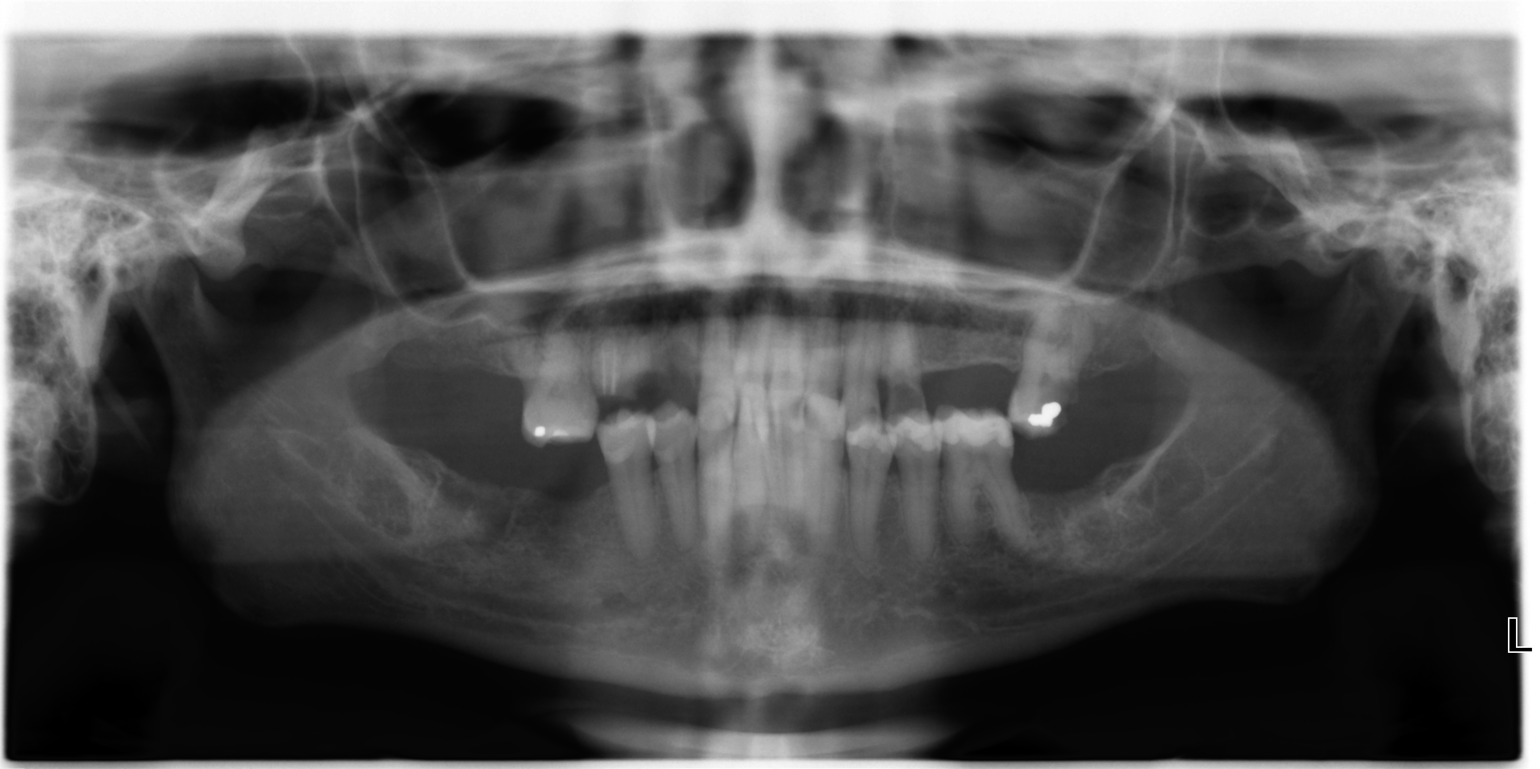

[1 of 1 positions shown; findings below may reference images not displayed]

FINDINGS: The patient is missing multiple teeth. Dental amalgam is identified.
There is a large carie involving the left upper fourth tooth. The
crown of the right upper third tooth is missing and there is a large
carie within the base of this tooth.Lucency around the root of the
right upper third is suspected which may represent abscess. The
crown of the right upper fourth tooth is also missing. The
visualized portions of the maxillary sinuses appear clear.
IMPRESSION: 1. Suspect periapical abscess involving the right upper third tooth.
2. Poor dentition with loss of multiple teeth as well as bilateral
upper tooth dental caries.

## 2018-08-16 MED ORDER — METOPROLOL TARTRATE 25 MG PO TABS: 25.0000 mg | ORAL_TABLET | Freq: Two times a day (BID) | ORAL | 1 refills | Status: DC

## 2018-08-16 MED ORDER — WARFARIN SODIUM 5 MG PO TABS: 5.0000 mg | ORAL_TABLET | Freq: Every day | ORAL | 0 refills | Status: DC

## 2018-08-16 NOTE — Telephone Encounter (Signed)
Rx(s) sent to pharmacy electronically.  

## 2018-08-21 ENCOUNTER — Telehealth: Payer: Self-pay

## 2018-08-21 NOTE — Telephone Encounter (Signed)
Called pt to let them know that their coumadin appt is friday

## 2018-08-23 ENCOUNTER — Other Ambulatory Visit: Payer: Self-pay

## 2018-08-23 ENCOUNTER — Encounter: Payer: Self-pay | Admitting: *Deleted

## 2018-08-23 ENCOUNTER — Ambulatory Visit (INDEPENDENT_AMBULATORY_CARE_PROVIDER_SITE_OTHER): Payer: 59 | Admitting: Pharmacist Clinician (PhC)/ Clinical Pharmacy Specialist

## 2018-08-23 DIAGNOSIS — Z952 Presence of prosthetic heart valve: Secondary | ICD-10-CM

## 2018-08-23 DIAGNOSIS — Z5181 Encounter for therapeutic drug level monitoring: Secondary | ICD-10-CM | POA: Diagnosis not present

## 2018-08-23 LAB — POCT INR: INR: 1.6 — AB (ref 2.0–3.0)

## 2018-08-23 MED ORDER — METOPROLOL TARTRATE 25 MG PO TABS: 25.0000 mg | ORAL_TABLET | Freq: Two times a day (BID) | ORAL | 1 refills | Status: DC

## 2018-08-26 IMAGING — DX DG CHEST 1V PORT
1 series · 1 of 1 positions shown · non-contrast
Comparison: 06/05/2017

CLINICAL DATA: Left lower lobe atelectasis. Status post aortic
valve replacement.

EXAM:
PORTABLE CHEST 1 VIEW

[chest]
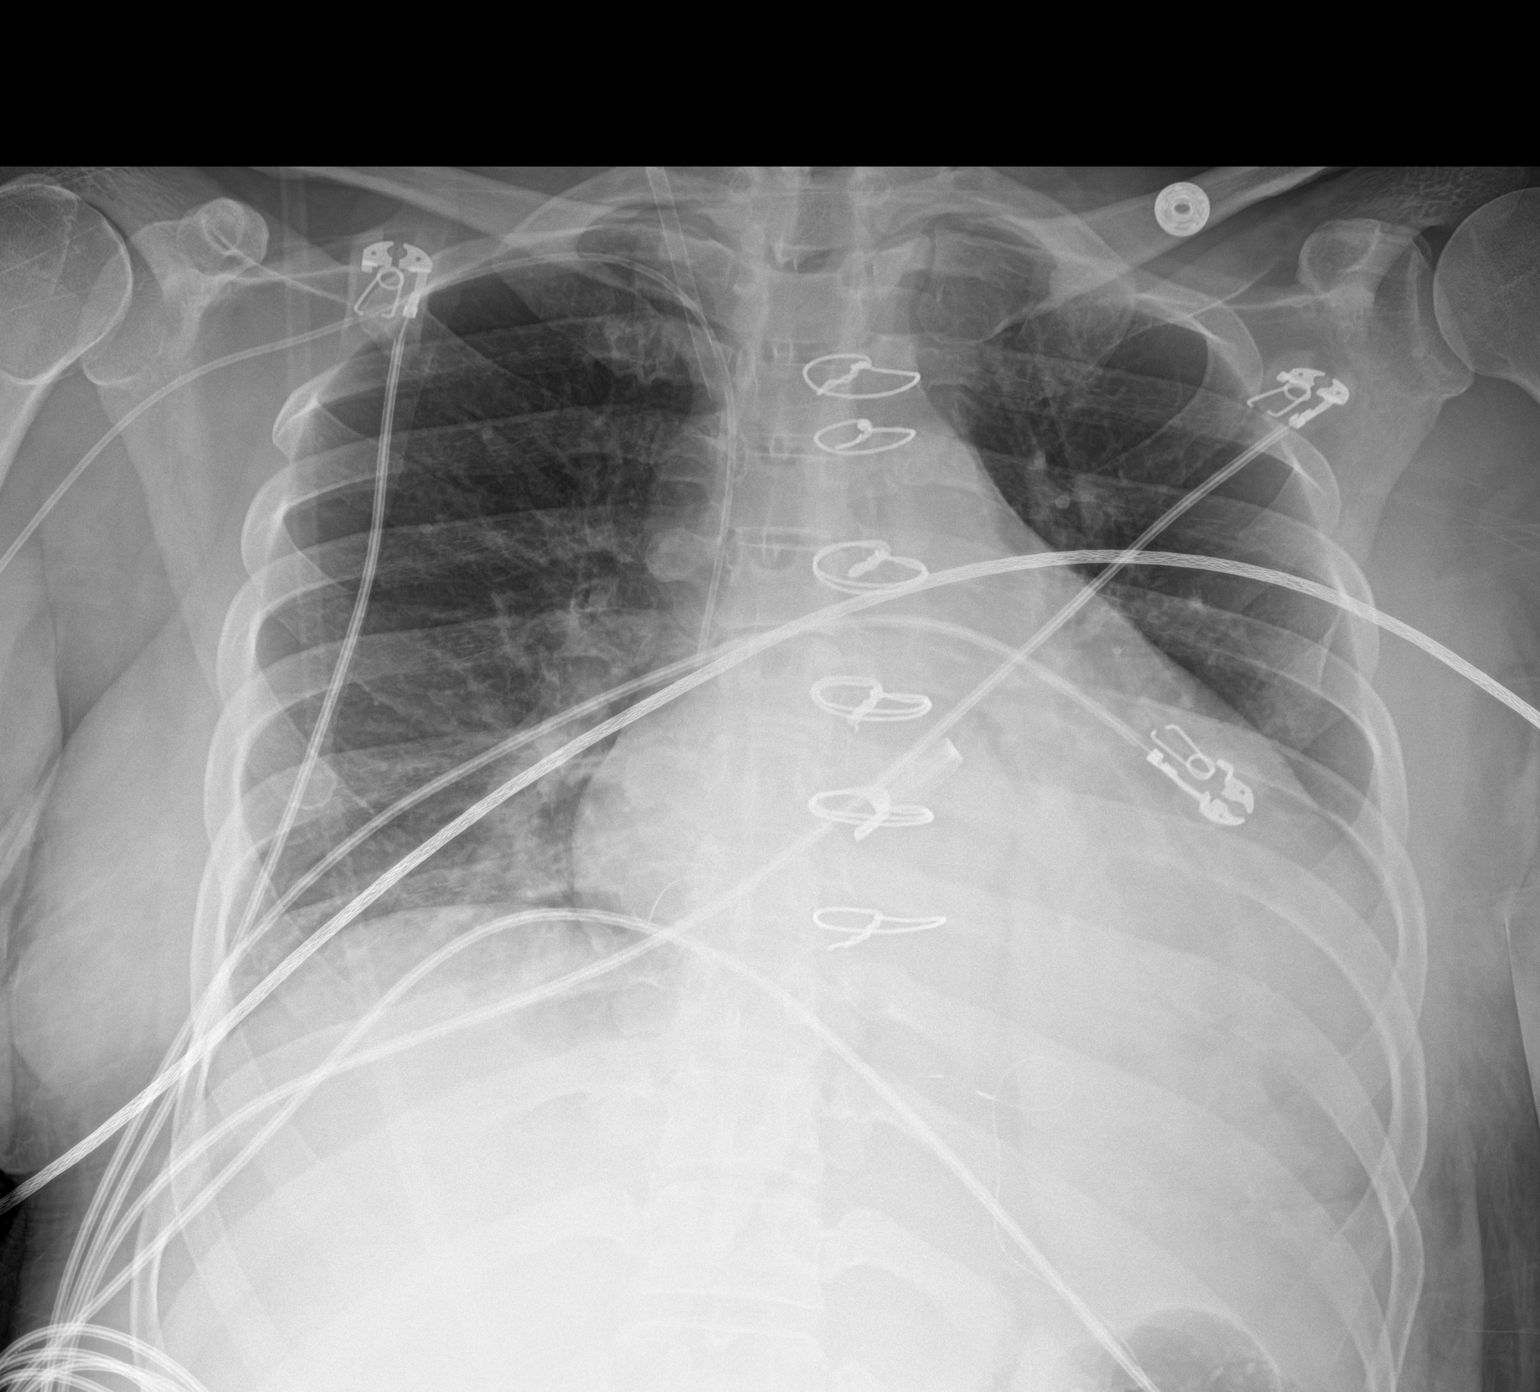

[1 of 1 positions shown; findings below may reference images not displayed]

FINDINGS: Swan-Ganz catheter has been removed. Sheath remains. PICC remains.
Chest tube has been removed.

There is persistent atelectasis at the left lung base. Slight new
atelectasis at the right base. Lungs are otherwise clear.
Cardiomegaly. No pneumothorax. Pulmonary vascularity is normal.
Prosthetic aortic valve noted.
IMPRESSION: Persistent left base atelectasis. New slight atelectasis at the
right base.

## 2018-08-27 IMAGING — DX DG CHEST 2V
2 series · 2 of 2 positions shown · non-contrast
Comparison: Chest x-ray of June 06, 2017

CLINICAL DATA: Aortic valve replacement 3 days ago.

EXAM:
CHEST  2 VIEW

[chest lat]
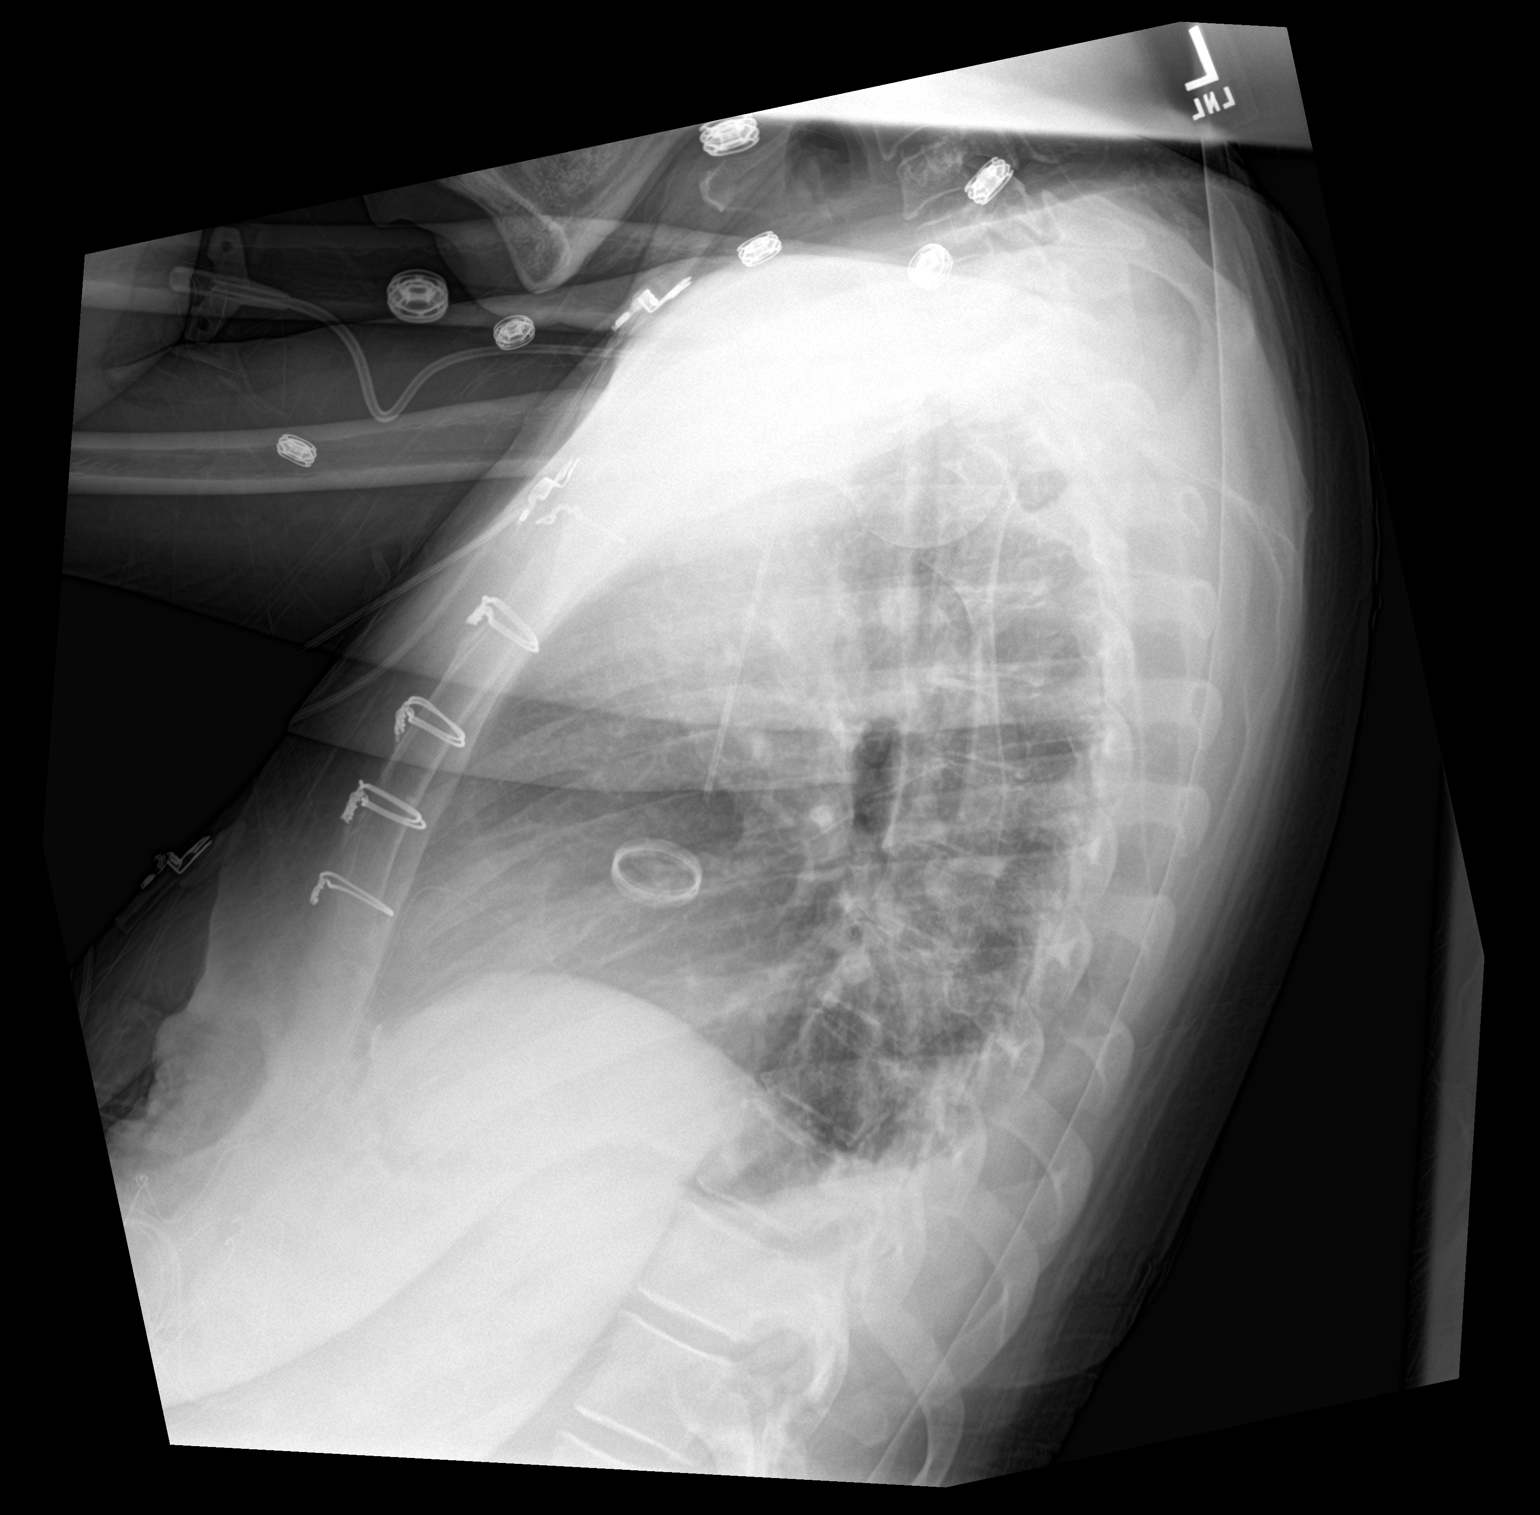

[chest ap]
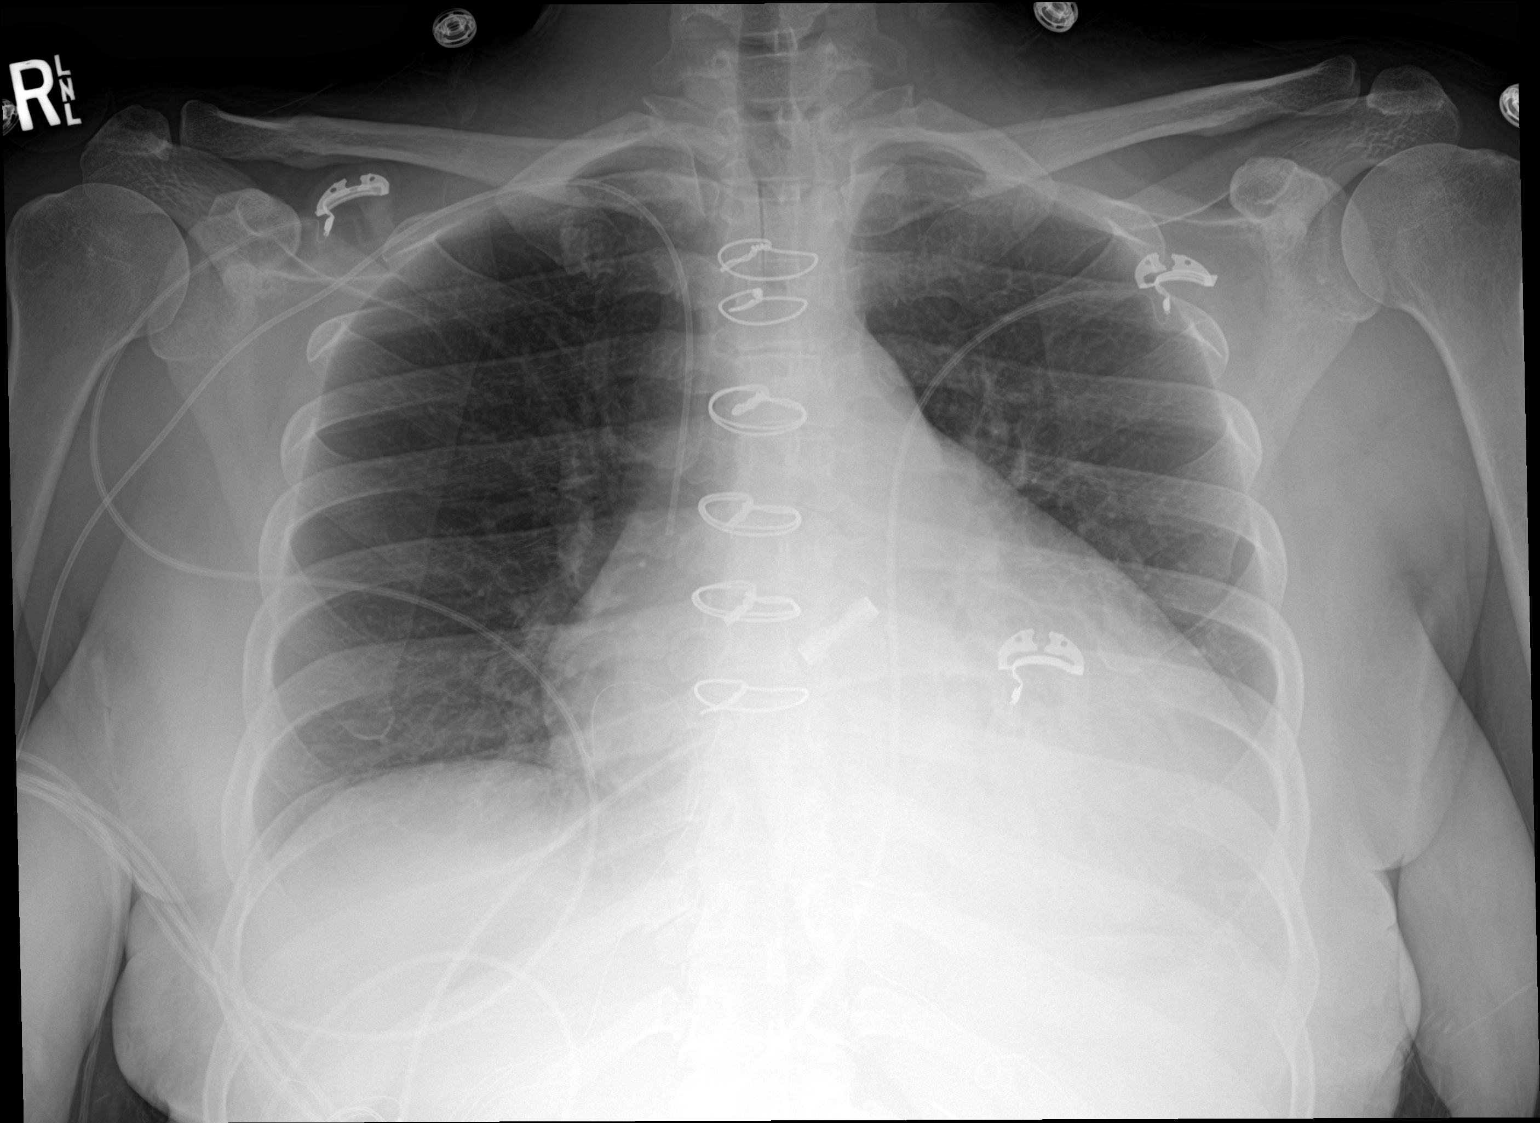

[2 of 2 positions shown; findings below may reference images not displayed]

FINDINGS: The lungs are adequately inflated. The retrocardiac region on the
left remains dense and the hemidiaphragm obscured. The cardiac
silhouette remains enlarged. The pulmonary vascularity is normal.
The sternal wires are intact. The right-sided PICC line tip projects
over the distal third of the SVC.
IMPRESSION: Persistent left lower lobe atelectasis or pneumonia. Interval
clearing of subsegmental atelectasis at the right lung base.

Stable cardiomegaly without pulmonary vascular congestion.

## 2018-08-28 IMAGING — DX DG CHEST 2V
2 series · 2 of 2 positions shown · non-contrast
Comparison: June 07, 2017

CLINICAL DATA: Status post aortic valve replacement

EXAM:
CHEST  2 VIEW

[chest pa]
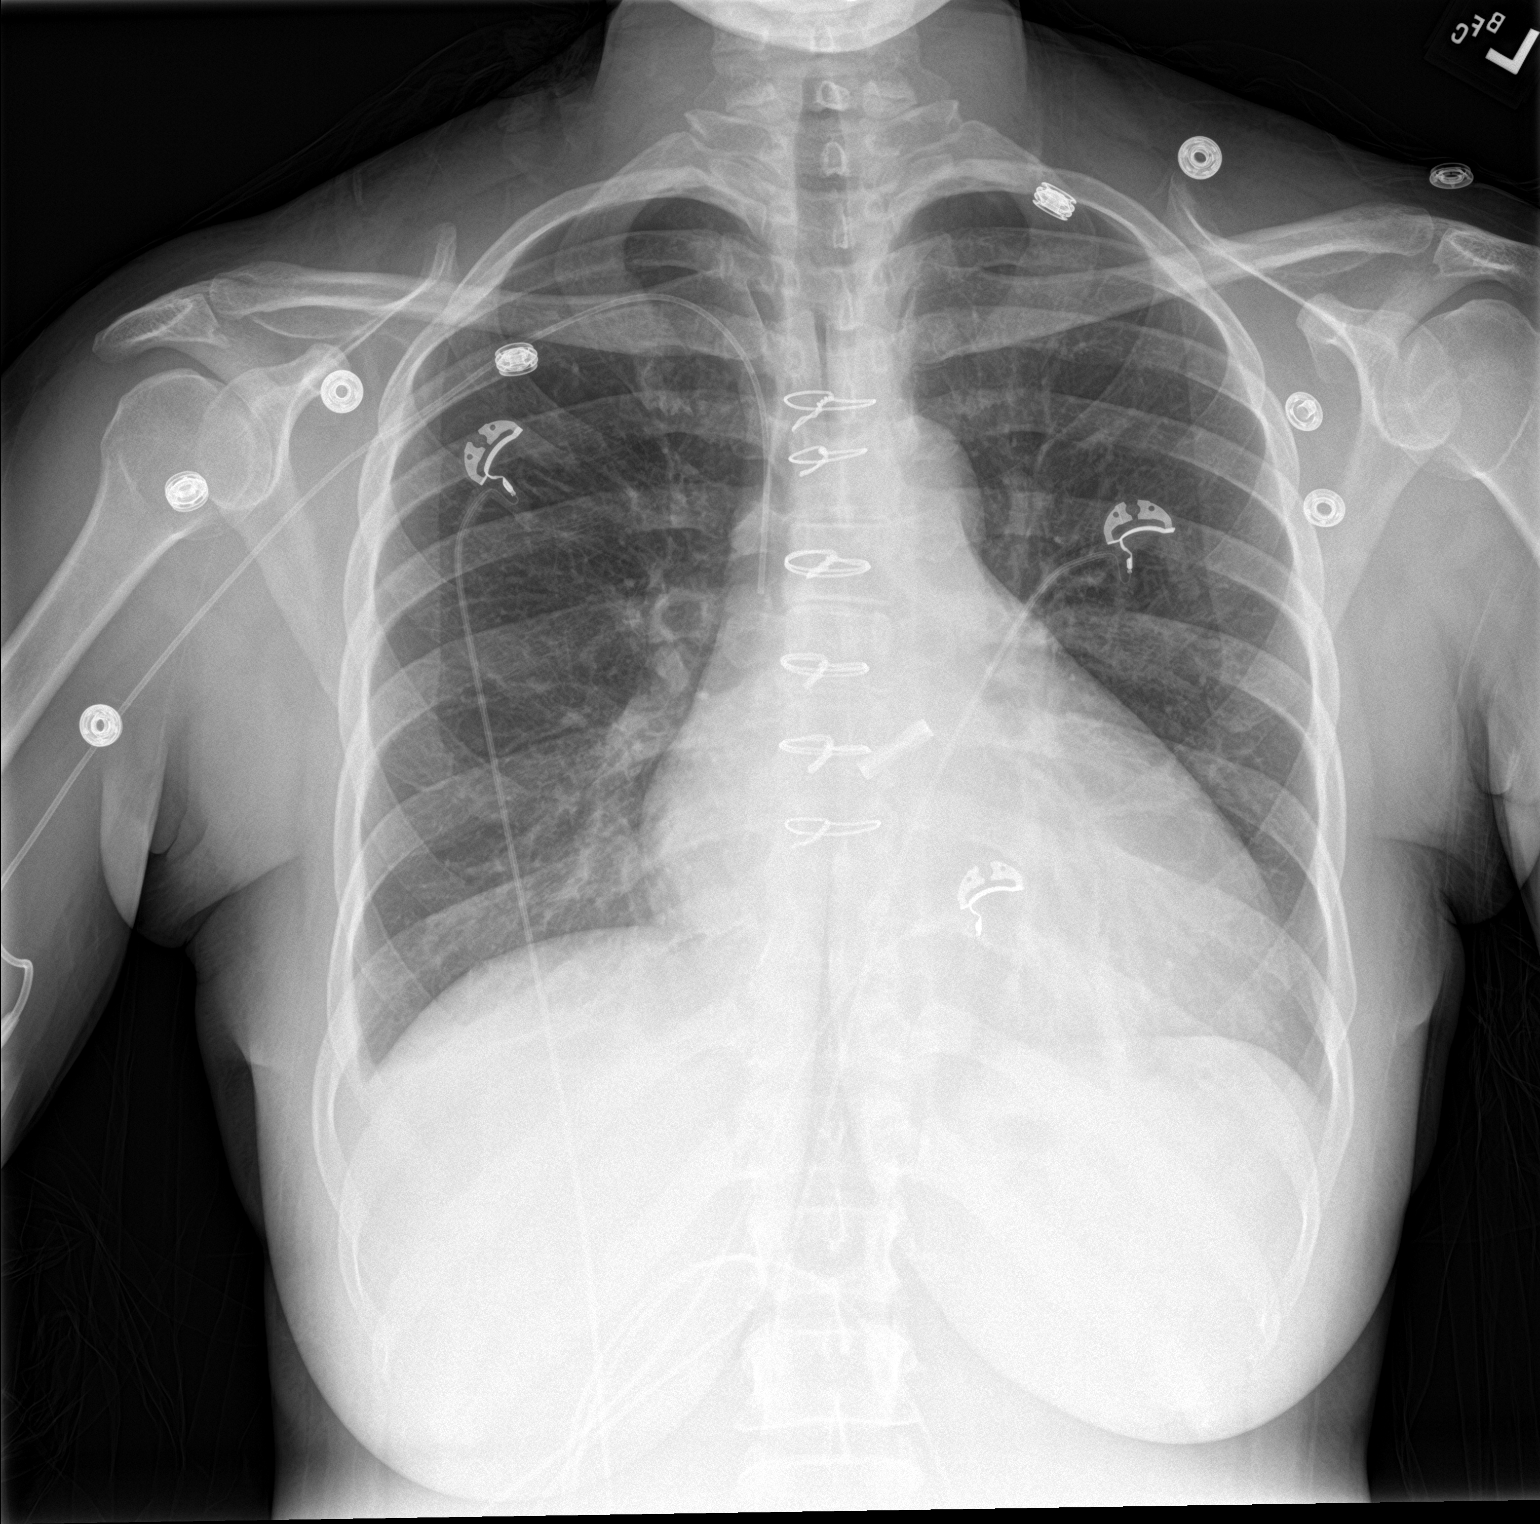

[chest lat]
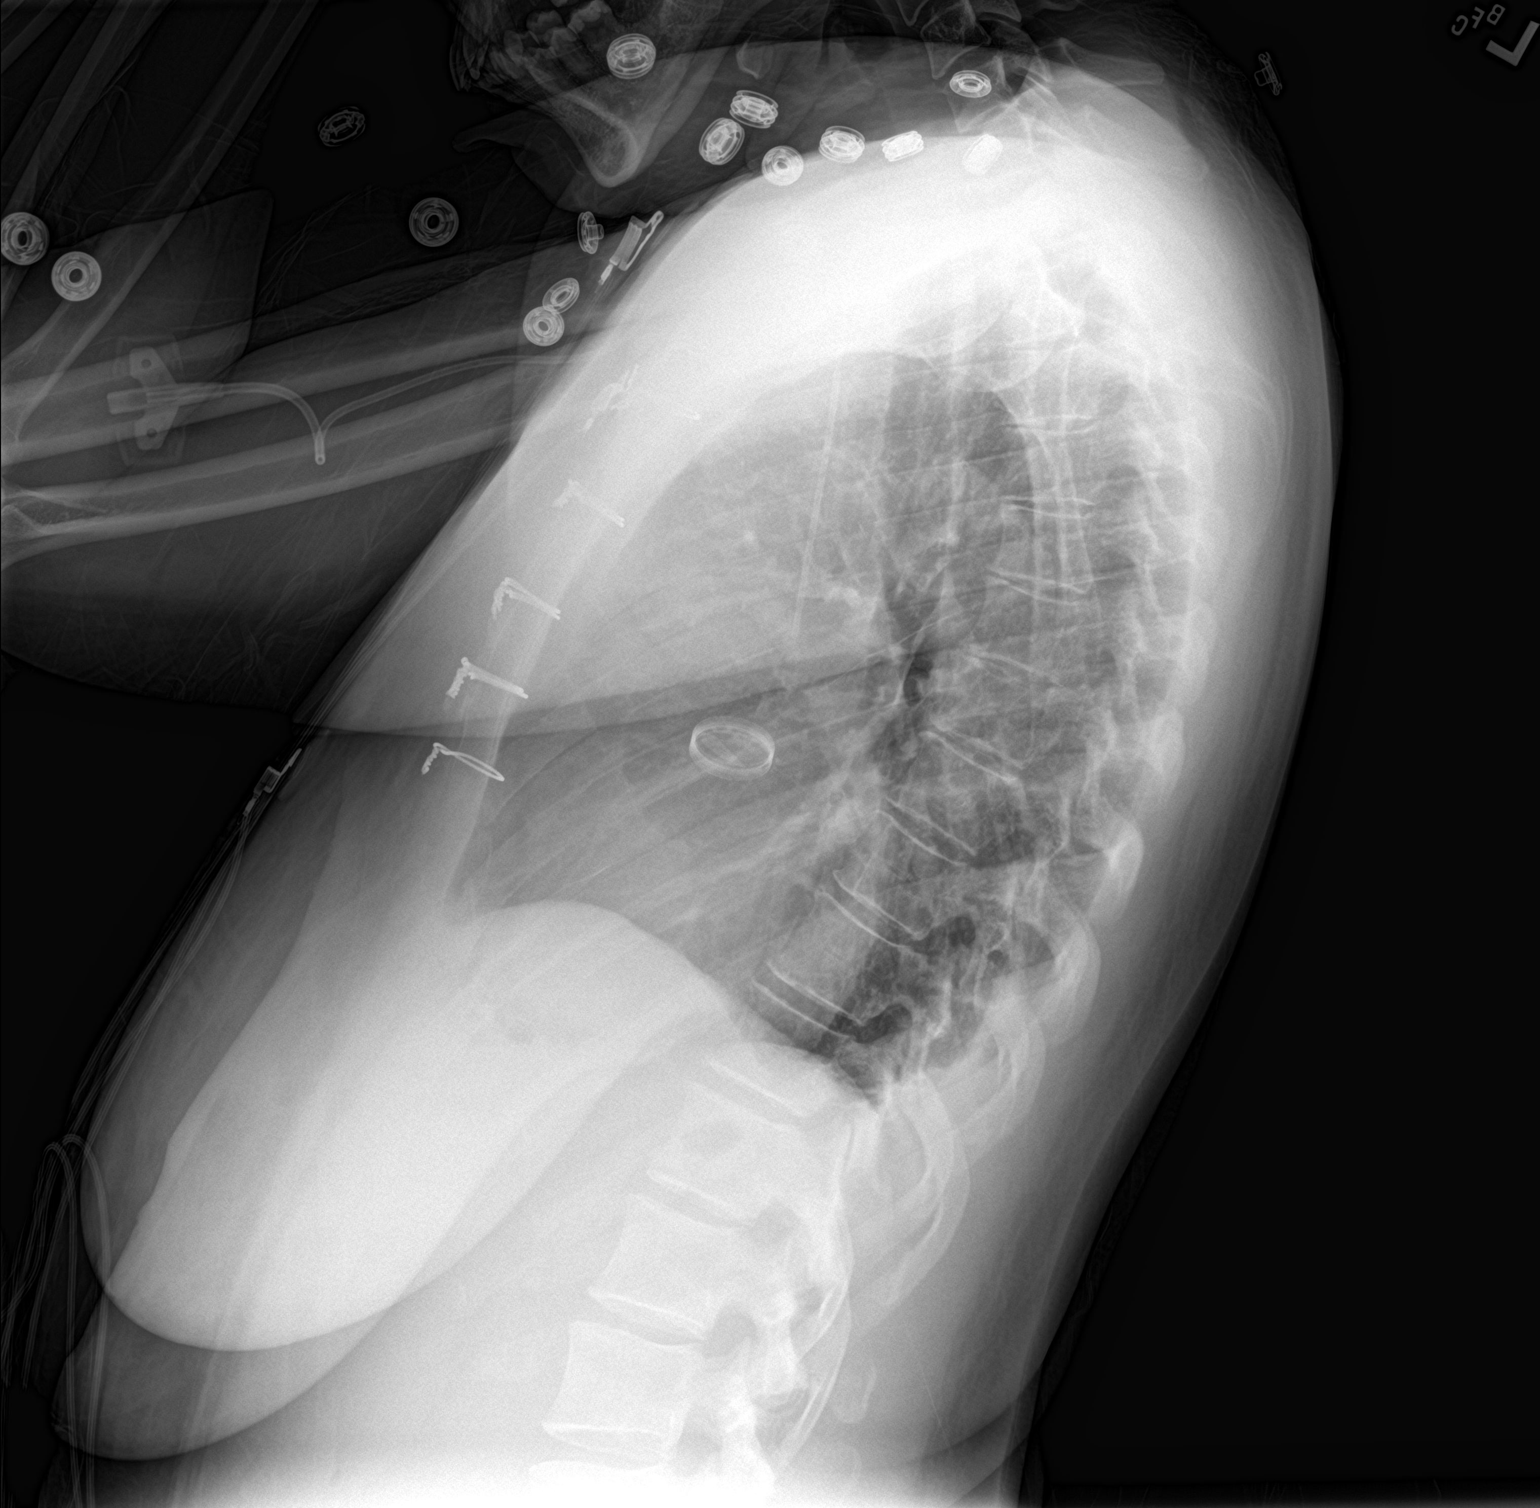

[2 of 2 positions shown; findings below may reference images not displayed]

FINDINGS: Central catheter tip is in the superior vena cava. No pneumothorax.
There is slight left base atelectasis. Lungs elsewhere clear. Heart
is mildly enlarged with pulmonary vascularity within normal limits.
There is an aortic valve replacement. Mild separation of the upper
manubrium is noted with wire in place in this area.
IMPRESSION: Slight left base atelectasis. No edema or consolidation. Stable
cardiac prominence. Central catheter tip in superior vena cava. No
pneumothorax.

Mild widening in the upper manubrium at the postoperative site
noted. A sternal wire transfixes this area.

## 2018-09-04 ENCOUNTER — Telehealth: Payer: Self-pay

## 2018-09-04 NOTE — Telephone Encounter (Signed)
LMOM FOR PRESCREEN/DRIVE THRU 

## 2018-09-04 NOTE — Telephone Encounter (Signed)

## 2018-09-09 ENCOUNTER — Telehealth: Payer: Self-pay | Admitting: Pharmacist

## 2018-09-09 NOTE — Telephone Encounter (Signed)
LMOM to prescreen pt for INR check tomorrow. 

## 2018-09-09 NOTE — Telephone Encounter (Signed)

## 2018-09-10 ENCOUNTER — Telehealth: Payer: Self-pay | Admitting: Cardiology

## 2018-09-10 ENCOUNTER — Ambulatory Visit (INDEPENDENT_AMBULATORY_CARE_PROVIDER_SITE_OTHER): Payer: 59 | Admitting: Pharmacist

## 2018-09-10 ENCOUNTER — Other Ambulatory Visit: Payer: Self-pay

## 2018-09-10 DIAGNOSIS — Z5181 Encounter for therapeutic drug level monitoring: Secondary | ICD-10-CM | POA: Diagnosis not present

## 2018-09-10 DIAGNOSIS — Z952 Presence of prosthetic heart valve: Secondary | ICD-10-CM | POA: Diagnosis not present

## 2018-09-10 LAB — POCT INR: INR: 2.7 (ref 2.0–3.0)

## 2018-09-10 NOTE — Telephone Encounter (Signed)
New Message ° ° ° °Pt is returning call  ° ° ° °Please call back  °

## 2018-09-16 ENCOUNTER — Telehealth: Payer: Self-pay

## 2018-09-16 MED ORDER — WARFARIN SODIUM 5 MG PO TABS: ORAL_TABLET | ORAL | 0 refills | Status: DC

## 2018-09-16 NOTE — Telephone Encounter (Signed)
RX refills sent

## 2018-09-17 ENCOUNTER — Other Ambulatory Visit: Payer: Self-pay

## 2018-09-18 ENCOUNTER — Telehealth: Payer: Self-pay | Admitting: *Deleted

## 2018-09-18 ENCOUNTER — Encounter: Payer: Self-pay | Admitting: *Deleted

## 2018-09-18 NOTE — Telephone Encounter (Signed)
   Cardiac Questionnaire:    Since your last visit or hospitalization:    1. Have you been having new or worsening chest pain? NO   2. Have you been having new or worsening shortness of breath? NO 3. Have you been having new or worsening leg swelling, wt gain, or increase in abdominal girth (pants fitting more tightly)? NO   4. Have you had any passing out spells? NO    *A YES to any of these questions would result in the appointment being kept. *If all the answers to these questions are NO, we should indicate that given the current situation regarding the worldwide coronarvirus pandemic, at the recommendation of the CDC, we are looking to limit gatherings in our waiting area, and thus will reschedule their appointment beyond four weeks from today.   _____________   TFTDD-22 Pre-Screening Questions:  . Do you currently have a fever? NO . Have you recently travelled on a cruise, internationally, or to [place], [place], [place], [place], [place], or [place], [place] [employer])? NO . Have you been in contact with someone that is currently pending confirmation of Covid19 testing or has been confirmed to have the [place] virus? NO Are you currently experiencing fatigue or cough? NO        Spoke with the patient and she said that she would be ok with having a telephone visit with Dr. Martinique on Friday September 20, 2018 at 11:40 am. We went over her medications, pharmacy, and history.

## 2018-09-19 NOTE — Telephone Encounter (Signed)
Thanks

## 2018-09-20 ENCOUNTER — Telehealth: Payer: Self-pay | Admitting: Cardiology

## 2018-09-20 ENCOUNTER — Telehealth (INDEPENDENT_AMBULATORY_CARE_PROVIDER_SITE_OTHER): Payer: 59 | Admitting: Cardiology

## 2018-09-20 VITALS — Ht 64.0 in | Wt 215.0 lb

## 2018-09-20 DIAGNOSIS — I739 Peripheral vascular disease, unspecified: Secondary | ICD-10-CM

## 2018-09-20 DIAGNOSIS — I10 Essential (primary) hypertension: Secondary | ICD-10-CM | POA: Diagnosis not present

## 2018-09-20 DIAGNOSIS — Z5181 Encounter for therapeutic drug level monitoring: Secondary | ICD-10-CM

## 2018-09-20 DIAGNOSIS — Z952 Presence of prosthetic heart valve: Secondary | ICD-10-CM

## 2018-09-20 MED ORDER — ROSUVASTATIN CALCIUM 10 MG PO TABS: 10.0000 mg | ORAL_TABLET | Freq: Every day | ORAL | 3 refills | Status: DC

## 2018-09-20 MED ORDER — METOPROLOL TARTRATE 50 MG PO TABS: 50.0000 mg | ORAL_TABLET | Freq: Two times a day (BID) | ORAL | 3 refills | Status: DC

## 2018-09-20 MED ORDER — WARFARIN SODIUM 5 MG PO TABS: ORAL_TABLET | ORAL | 3 refills | Status: DC

## 2018-09-20 NOTE — Telephone Encounter (Signed)
Ms Spielberg said she just missed a call and was calling back.

## 2018-09-20 NOTE — Patient Instructions (Signed)
Increase metoprolol to 50 mg twice a day. Monitor your blood pressure   We will add Crestor 10 mg daily for your cholesterol since you have peripheral arterial disease.   We will check lab work in 3 months and I will see you in 6 months.  Try and see about getting your dental work done.   Continue walking and take good care of your feet.

## 2018-09-20 NOTE — Progress Notes (Signed)
Virtual Visit via Telephone Note   This visit type was conducted due to national recommendations for restrictions regarding the COVID-19 Pandemic (e.g. social distancing) in an effort to limit this patient's exposure and mitigate transmission in our community.  Due to her co-morbid illnesses, this patient is at least at moderate risk for complications without adequate follow up.  This format is felt to be most appropriate for this patient at this time.  The patient did not have access to video technology/had technical difficulties with video requiring transitioning to audio format only (telephone).  All issues noted in this document were discussed and addressed.  No physical exam could be performed with this format.  Please refer to the patient's chart for her  consent to telehealth for [facility].   Evaluation Performed:  Follow-up visit  Date:  09/20/2018   ID:  Krystal Shaffer, DOB [birth year], MRN [id number]  Patient Location: Home  Provider Location: Office  PCP:  Krystal Carol, MD  Cardiologist:  Krystal Derusha Martinique, MD  Electrophysiologist:  None   Chief Complaint:  S/p AVR  History of Present Illness:    Krystal Shaffer is a 43 y.o. female who presents via audio/video conferencing for a telehealth visit today.  She has a history of AVR  due to bacterial endocarditis, and severe aortic insufficiency of a bicuspid AoV. She initially presented in early December 2018 with an E coli UTI that failed to clear with oral antibiotics. She was treated with IV antibiotics. She was readmitted 10 days later with persistent fever. Blood cultures grew strep viridans. Dental evaluation was positive for multiple dental abscesses. She had dental extractions. On 06/04/17 she underwent AVR with Dr. Prescott Shaffer. Aortic valve replacement with a 21 mm mechanical valve -[employer]orin [employer] serial number [id number]. She completed outpatient antibiotics. Coronary CTA then was normal. Calcium score 0.   She was  admitted in September 2019 with SIRS. Fever, chest and back Shaffer. Patient was treated for 5 days with IV vancomycin and IV Rocephin. Cultures were negative. TEE neg for vegetations. CT Angioof the chest is negative for pulmonary embolisms or any acute cardiopulmonary process. Patient also hadMRI of the T-spine which is negative for epidural abscess or osteomyelitis.   She was seen by Dr Krystal Shaffer in December and complained of claudication. LE arterial dopplers were done showing ABI of 0.5 on the left. Per note patient was to be referred to [facility] but this has not happened.   The patient states she is doing very well. She denies any chest Shaffer, SOB, palpitations. She has noted her HR is running in the high 90s. She took an extra metoprolol a couple of times and actually felt much better. She recently moved a month ago and is now sleeping much better. She was having left leg claudication walking 25 feet before. Her leg would get really tired and heavy. She has been walking regularly and her symptoms have improved. No sores or blisters on her feet. She is still trying to look into having her dental/periodontal disease taking care of. Has to find a dentist. Tolerating medication well.   The patient does not have symptoms concerning for COVID-19 infection (fever, chills, cough, or new shortness of breath).    Past Medical History:  Diagnosis Date   Cystitis 05/2017   Endocarditis    Family history of adverse reaction to anesthesia    " mother has a hard time waking "   Migraine    Migraines    SIRS (systemic  inflammatory response syndrome) ([place]) 02/15/2018   Past Surgical History:  Procedure Laterality Date   AORTIC VALVE REPLACEMENT N/A 06/04/2017   Procedure: AORTIC VALVE REPLACEMENT (AVR);  Surgeon: Krystal Shaffer, Collier Salina, MD;  Location: [place];  Service: Open Heart Surgery;  Laterality: N/A;   MULTIPLE EXTRACTIONS WITH ALVEOLOPLASTY N/A 05/31/2017   Procedure: Extraction of tooth  #'s  [phone] with alveoloplasty and gross debridement of remaining dentition;  Surgeon: Krystal Shaffer, DDS;  Location: [place];  Service: Oral Surgery;  Laterality: N/A;   TEE WITHOUT CARDIOVERSION N/A 05/29/2017   Procedure: TRANSESOPHAGEAL ECHOCARDIOGRAM (TEE);  Surgeon: Krystal Pain, MD;  Location[facility] ENDOSCOPY;  Service: Cardiovascular;  Laterality: N/A;   TEE WITHOUT CARDIOVERSION N/A 06/04/2017   Procedure: TRANSESOPHAGEAL ECHOCARDIOGRAM (TEE);  Surgeon: Krystal Shaffer, Collier Salina, MD;  Location: [place];  Service: Open Heart Surgery;  Laterality: N/A;   TEE WITHOUT CARDIOVERSION N/A 02/19/2018   Procedure: TRANSESOPHAGEAL ECHOCARDIOGRAM (TEE);  Surgeon: Krystal Margarita, MD;  Location: [facility] ENDOSCOPY;  Service: Cardiovascular;  Laterality: N/A;   WISDOM TOOTH EXTRACTION       Current Meds  Medication Sig   acetaminophen (TYLENOL) 325 MG tablet Take 2 tablets (650 mg total) by mouth every 6 (six) hours as needed for mild Shaffer, fever or headache.   amoxicillin (AMOXIL) 500 MG tablet Take 2,000 mg by mouth See admin instructions. Take 4 tablets (2000 mg) by mouth one hour prior to dental appointments   aspirin EC 81 MG EC tablet Take 1 tablet (81 mg total) by mouth daily.   lisinopril-hydrochlorothiazide (PRINZIDE,ZESTORETIC) 20-12.5 MG tablet Take 1 tablet by mouth daily.   warfarin (COUMADIN) 5 MG tablet Take 1 tablet by mouth daily or as directed by Coumadin clinic   [DISCONTINUED] metoprolol tartrate (LOPRESSOR) 25 MG tablet Take 1 tablet (25 mg total) by mouth 2 (two) times daily.   [DISCONTINUED] warfarin (COUMADIN) 5 MG tablet Take 1 tablet by mouth daily or as directed by Coumadin clinic     Allergies:   Patient has no known allergies.   Social History   Tobacco Use   Smoking status: Former Smoker   Smokeless tobacco: Never Used  Substance Use Topics   Alcohol use: Yes    Comment: occasional   Drug use: No     Family Hx: The patient's family history includes  Cirrhosis in her father; Hypertension in her sister; Obesity in her mother.  ROS:   Please see the history of present illness.    All other systems reviewed and are negative.   Prior CV studies:   The following studies were reviewed today:  Echo 02/17/18: Study Conclusions  - Left ventricle: The cavity size was normal. Systolic function was   normal. The estimated ejection fraction was in the range of 55%   to 60%. Wall motion was normal; there were no regional wall   motion abnormalities. Left ventricular diastolic function   parameters were normal. - Aortic valve: A mechanical prosthesis was present. There was   moderate stenosis. There was mild regurgitation. Peak velocity   (S): 375 cm/s. Mean gradient (S): 32 mm Hg. Valve area (VTI):   0.88 cm^2. Valve area (Vmax): 0.86 cm^2. Valve area (Vmean): 0.87   cm^2. - Mitral valve: Transvalvular velocity was within the normal range.   There was no evidence for stenosis. There was no regurgitation. - Right ventricle: The cavity size was normal. Wall thickness was   normal. Systolic function was normal. - Right atrium: The atrium was  mildly dilated. - Atrial septum: No defect or patent foramen ovale was identified   by color flow Doppler. - Tricuspid valve: There was mild regurgitation. - Pulmonary arteries: Systolic pressure was within the normal   range. PA peak pressure: 22 mm Hg (S). - Pericardium, extracardiac: A trivial pericardial effusion was   identified.  Impressions:  - Compared with the echo 09/2017 the mean gradient across the   mechanical aortic valve has increased from 20 mmHg to 32 mmHg.   There are no obvious vegetations. Consider TEE to better   evaluate.   TEE 02/19/18: Study Conclusions  - Left ventricle: Systolic function was normal. The estimated   ejection fraction was in the range of 55% to 60%. Wall motion was   normal; there were no regional wall motion abnormalities. - Aortic valve: Stable  Mechanical AV prosthesis with no rocking   motion, no visible vegetation, normal leaflet motion and no   perivalvular AI. - Mitral valve: There was mild regurgitation. - Left atrium: No evidence of thrombus in the atrial cavity or   appendage. - Right atrium: No evidence of thrombus in the atrial cavity or   appendage. - Pulmonic valve: There was trivial regurgitation.  Impressions:  - Normal LVF with mild MR. There is a normal functioning mechanical   aortic valve prosthesis with no perivalvular leak. The increased   gradient across the AVR noted on TTE is likely related to   prosthesis - patient mismatch. The AVR was a 46mm mechanical   prosthesis.  Labs/Other Tests and Data Reviewed:    EKG:  No ECG reviewed.  Recent Labs: 02/17/2018: Magnesium 2.0 02/20/2018: ALT 18; BUN 11; Creatinine, Ser 0.88; Hemoglobin 10.3; Platelets 235; Potassium 3.9; Sodium 138   Dated 08/30/17: cholesterol 130, triglycerides 46, HDL 50, LDL 71.   Recent Lipid Panel No results found for: CHOL, TRIG, HDL, CHOLHDL, LDLCALC, LDLDIRECT  Wt Readings from Last 3 Encounters:  09/20/18 215 lb (97.5 kg)  05/15/18 205 lb 6.4 oz (93.2 kg)  03/06/18 186 lb 3.2 oz (84.5 kg)     Objective:    Vital Signs:  Ht 5\' 4"  (1.626 m)    Wt 215 lb (97.5 kg)    BMI 36.90 kg/m      ASSESSMENT & PLAN:    1.  AV endocarditis with a bicuspid AV. Severe AI.  S/P aortic valve replacement  with a 21 mm mechanical valve -[employer] serial number [id number] by Dr. Nils Pyle in December 2018. This was completed in the setting of bacterial endocarditis and vegetation on a bicuspid valve. Subsequent Echo and TEE showed normally functioning valve without vegetation in September 2019. Some prosthesis/patient mismatch with elevated gradient. She is on chronic coumadin. Routine SBE prophylaxis. She will need to get her periodontal disease taken care of to reduce risk of recurrent endocarditis.    2. PAD. Left leg  claudication. Symptoms have improved with regular walking program. Will need to follow dopplers. Although her lipids have looked good in the past with last LDL of 71 she should still be on a statin with PAD. Will start Crestor 10 mg daily. Repeat labs in 3 months  3. HTN - patient will monitor. Will increase Metoprolol to 50 mg bid since she felt better on higher dose and HR is elevated.   COVID-19 Education: The signs and symptoms of COVID-19 were discussed with the patient and how to seek care for testing (follow up with PCP or arrange E-visit).  The importance  of social distancing was discussed today.  Time:   Today, I have spent 15 minutes with the patient with telehealth technology discussing the above problems.     Medication Adjustments/Labs and Tests Ordered: Current medicines are reviewed at length with the patient today.  Concerns regarding medicines are outlined above.  Tests Ordered: Orders Placed This Encounter  Procedures   Basic metabolic panel   Lipid panel   Hepatic function panel   Medication Changes: Meds ordered this encounter  Medications   metoprolol tartrate (LOPRESSOR) 50 MG tablet    Sig: Take 1 tablet (50 mg total) by mouth 2 (two) times daily.    Dispense:  180 tablet    Refill:  3   rosuvastatin (CRESTOR) 10 MG tablet    Sig: Take 1 tablet (10 mg total) by mouth daily.    Dispense:  90 tablet    Refill:  3   warfarin (COUMADIN) 5 MG tablet    Sig: Take 1 tablet by mouth daily or as directed by Coumadin clinic    Dispense:  115 tablet    Refill:  3    35 tablets is a 30 day supply    Disposition:  Follow up in 6 month(s)  Signed, Welford Christmas Martinique, MD  09/20/2018 12:45 PM    [place]

## 2018-09-30 ENCOUNTER — Telehealth: Payer: Self-pay

## 2018-09-30 NOTE — Telephone Encounter (Signed)

## 2018-10-04 ENCOUNTER — Other Ambulatory Visit: Payer: Self-pay

## 2018-10-04 ENCOUNTER — Ambulatory Visit (INDEPENDENT_AMBULATORY_CARE_PROVIDER_SITE_OTHER): Payer: 59

## 2018-10-04 DIAGNOSIS — Z5181 Encounter for therapeutic drug level monitoring: Secondary | ICD-10-CM

## 2018-10-04 DIAGNOSIS — Z952 Presence of prosthetic heart valve: Secondary | ICD-10-CM | POA: Diagnosis not present

## 2018-10-04 LAB — POCT INR: INR: 3.5 — AB (ref 2.0–3.0)

## 2018-10-04 NOTE — Patient Instructions (Addendum)
  Description   Called spoke with pt, advised to skip today's dosage of Coumadin, then resume same dosage 5mg  tablet daily.  Repeat INR in 3 weeks.  Pt will start consistently eating vit K foods once weekly.

## 2018-10-24 ENCOUNTER — Telehealth: Payer: Self-pay

## 2018-10-24 NOTE — Telephone Encounter (Signed)

## 2018-10-25 ENCOUNTER — Other Ambulatory Visit: Payer: Self-pay

## 2018-10-25 ENCOUNTER — Ambulatory Visit (INDEPENDENT_AMBULATORY_CARE_PROVIDER_SITE_OTHER): Payer: 59 | Admitting: Pharmacist

## 2018-10-25 DIAGNOSIS — Z952 Presence of prosthetic heart valve: Secondary | ICD-10-CM | POA: Diagnosis not present

## 2018-10-25 DIAGNOSIS — Z5181 Encounter for therapeutic drug level monitoring: Secondary | ICD-10-CM

## 2018-10-25 LAB — POCT INR: INR: 4.1 — AB (ref 2.0–3.0)

## 2018-11-12 ENCOUNTER — Telehealth: Payer: Self-pay

## 2018-11-12 NOTE — Telephone Encounter (Signed)

## 2018-11-15 ENCOUNTER — Ambulatory Visit (INDEPENDENT_AMBULATORY_CARE_PROVIDER_SITE_OTHER): Payer: 59 | Admitting: *Deleted

## 2018-11-15 ENCOUNTER — Other Ambulatory Visit: Payer: Self-pay

## 2018-11-15 DIAGNOSIS — Z952 Presence of prosthetic heart valve: Secondary | ICD-10-CM | POA: Diagnosis not present

## 2018-11-15 DIAGNOSIS — Z5181 Encounter for therapeutic drug level monitoring: Secondary | ICD-10-CM

## 2018-11-15 LAB — POCT INR: INR: 1.3 — AB (ref 2.0–3.0)

## 2018-11-22 ENCOUNTER — Ambulatory Visit (INDEPENDENT_AMBULATORY_CARE_PROVIDER_SITE_OTHER): Payer: 59 | Admitting: *Deleted

## 2018-11-22 ENCOUNTER — Other Ambulatory Visit: Payer: Self-pay

## 2018-11-22 DIAGNOSIS — Z5181 Encounter for therapeutic drug level monitoring: Secondary | ICD-10-CM | POA: Diagnosis not present

## 2018-11-22 DIAGNOSIS — Z952 Presence of prosthetic heart valve: Secondary | ICD-10-CM | POA: Diagnosis not present

## 2018-11-22 LAB — POCT INR: INR: 2.7 (ref 2.0–3.0)

## 2018-11-22 NOTE — Patient Instructions (Signed)
Description   Continue  taking 5mg  tablet daily excepet 2.5mg  on Saturdays.  Repeat INR in 1 week.  Pt will start consistently eating vit K foods once weekly.

## 2018-12-02 ENCOUNTER — Telehealth: Payer: Self-pay

## 2018-12-02 NOTE — Telephone Encounter (Signed)

## 2018-12-02 NOTE — Telephone Encounter (Signed)
lmom for prescreen  

## 2018-12-10 ENCOUNTER — Other Ambulatory Visit: Payer: Self-pay

## 2018-12-12 ENCOUNTER — Telehealth: Payer: Self-pay | Admitting: *Deleted

## 2018-12-12 MED ORDER — METOPROLOL TARTRATE 50 MG PO TABS: 50.0000 mg | ORAL_TABLET | Freq: Two times a day (BID) | ORAL | 5 refills | Status: DC

## 2018-12-12 NOTE — Telephone Encounter (Signed)
refill 

## 2018-12-17 NOTE — Progress Notes (Signed)
This does not appear to to be my patient although she is not therapeutic if she is on oral anticoagulation.

## 2018-12-30 ENCOUNTER — Telehealth: Payer: Self-pay

## 2018-12-30 ENCOUNTER — Other Ambulatory Visit: Payer: Self-pay

## 2018-12-30 MED ORDER — METOPROLOL TARTRATE 50 MG PO TABS: 50.0000 mg | ORAL_TABLET | Freq: Two times a day (BID) | ORAL | 5 refills | Status: DC

## 2018-12-30 NOTE — Telephone Encounter (Signed)

## 2019-01-02 ENCOUNTER — Other Ambulatory Visit: Payer: Self-pay

## 2019-01-02 ENCOUNTER — Ambulatory Visit (INDEPENDENT_AMBULATORY_CARE_PROVIDER_SITE_OTHER): Payer: 59 | Admitting: Pharmacist

## 2019-01-02 DIAGNOSIS — Z952 Presence of prosthetic heart valve: Secondary | ICD-10-CM

## 2019-01-02 DIAGNOSIS — Z5181 Encounter for therapeutic drug level monitoring: Secondary | ICD-10-CM

## 2019-01-02 LAB — POCT INR: INR: 2.4 (ref 2.0–3.0)

## 2019-02-28 ENCOUNTER — Ambulatory Visit: Payer: 59

## 2019-02-28 ENCOUNTER — Other Ambulatory Visit: Payer: Self-pay

## 2019-02-28 ENCOUNTER — Ambulatory Visit (INDEPENDENT_AMBULATORY_CARE_PROVIDER_SITE_OTHER): Payer: 59 | Admitting: Pharmacist Clinician (PhC)/ Clinical Pharmacy Specialist

## 2019-02-28 DIAGNOSIS — Z5181 Encounter for therapeutic drug level monitoring: Secondary | ICD-10-CM

## 2019-02-28 DIAGNOSIS — Z952 Presence of prosthetic heart valve: Secondary | ICD-10-CM

## 2019-02-28 LAB — POCT INR: INR: 2.4 (ref 2.0–3.0)

## 2019-03-04 ENCOUNTER — Other Ambulatory Visit: Payer: Self-pay | Admitting: Cardiology

## 2019-03-04 NOTE — Telephone Encounter (Signed)
°*  STAT* If patient is at the pharmacy, call can be transferred to refill team.   1. Which medications need to be refilled? (please list name of each medication and dose if known)  Lisinopril  2. Which pharmacy/location (including street and city if local pharmacy) is medication to be sent to? [facility] on [address]  3. Do they need a 30 day or 90 day supply?  Need enough her appt with Dr Martinique on 04-29-19

## 2019-03-05 MED ORDER — LISINOPRIL-HYDROCHLOROTHIAZIDE 20-12.5 MG PO TABS: 1.0000 | ORAL_TABLET | Freq: Every day | ORAL | 2 refills | Status: DC

## 2019-03-05 NOTE — Telephone Encounter (Signed)
Rx(s) sent to pharmacy electronically.  

## 2019-03-27 ENCOUNTER — Telehealth: Payer: Self-pay | Admitting: Cardiology

## 2019-03-27 NOTE — Telephone Encounter (Signed)
Amoxicillin 875 mg BID x's 7 days Clindamycin 300 mg one every 6 hours x's 5 days Nitrofurantoin 100 mg BID x's 5 days Started these today at 3:00 pm

## 2019-03-27 NOTE — Telephone Encounter (Signed)
LMOM - individually these are not problematic with warfarin.  If the combination gives her diarrhea, she should call back.

## 2019-03-27 NOTE — Telephone Encounter (Signed)
New message:     Patient calling stating that she is to report when taking medication.

## 2019-04-27 NOTE — Progress Notes (Deleted)
Cardiology Office Note   Date:  04/27/2019   ID:  Krystal Shaffer, DOB 1975-11-15, MRN [id number]  PCP:  Seward Carol, MD  Cardiologist:  Martinique, Ronnell Clinger MD  No chief complaint on file.    History of Present Illness: Krystal Shaffer is a 43 y.o. female who is seen for follow up AVR  due to bacterial endocarditis, and severe aortic insufficiency of a bicuspid AoV .  She initially presented in early December 2018 with an E coli UTI that failed to clear with oral antibiotics. She was treated with IV antibiotics. She was readmitted 10 days later with persistent fever. Blood cultures grew strep viridans. Dental evaluation was positive for multiple dental abscesses. She had dental extractions. On 06/04/17 she underwent AVR with Dr. Prescott Gum.  Aortic valve replacement with a 21 mm mechanical valve - [employer] serial number [id number]. She completed outpatient antibiotics.   She was admitted in September 2019 with SIRS. Fever, chest and back pain. Patient was treated for 5 days with IV vancomycin and IV Rocephin. Cultures were negative. TEE neg for vegetations. CT Angioof the chest is negative for pulmonary embolisms or any acute cardiopulmonary process. Patient also hadMRI of the T-spine which is negative for epidural abscess or osteomyelitis.   She was seen by Dr Prescott Gum in December 2019and complained of claudication. LE arterial dopplers were done showing ABI of 0.5 on the left. Per note patient was to be referred to [facility] but this has not happened.   The patient states she is doing very well. She denies any chest pain, SOB, palpitations. She has noted her HR is running in the high 90s. She took an extra metoprolol a couple of times and actually felt much better. She recently moved a month ago and is now sleeping much better. She was having left leg claudication walking 25 feet before. Her leg would get really tired and heavy. She has been walking regularly and her symptoms have  improved. No sores or blisters on her feet. She is still trying to look into having her dental/periodontal disease taking care of. Has to find a dentist. Tolerating medication well.    Past Medical History:  Diagnosis Date  . Cystitis 05/2017  . Endocarditis   . Family history of adverse reaction to anesthesia    " mother has a hard time waking "  . Migraine   . Migraines   . SIRS (systemic inflammatory response syndrome) ([place]) 02/15/2018    Past Surgical History:  Procedure Laterality Date  . AORTIC VALVE REPLACEMENT N/A 06/04/2017   Procedure: AORTIC VALVE REPLACEMENT (AVR);  Surgeon: Prescott Gum, Collier Salina, MD;  Location: [facility];  Service: Open Heart Surgery;  Laterality: N/A;  . MULTIPLE EXTRACTIONS WITH ALVEOLOPLASTY N/A 05/31/2017   Procedure: Extraction of tooth  #'[phone] with alveoloplasty and gross debridement of remaining dentition;  Surgeon: Lenn Cal, DDS;  Location: [facility];  Service: Oral Surgery;  Laterality: N/A;  . TEE WITHOUT CARDIOVERSION N/A 05/29/2017   Procedure: TRANSESOPHAGEAL ECHOCARDIOGRAM (TEE);  Surgeon: Jerline Pain, MD;  Location: [facility] ENDOSCOPY;  Service: Cardiovascular;  Laterality: N/A;  . TEE WITHOUT CARDIOVERSION N/A 06/04/2017   Procedure: TRANSESOPHAGEAL ECHOCARDIOGRAM (TEE);  Surgeon: Prescott Gum, Collier Salina, MD;  Location: [facility];  Service: Open Heart Surgery;  Laterality: N/A;  . TEE WITHOUT CARDIOVERSION N/A 02/19/2018   Procedure: TRANSESOPHAGEAL ECHOCARDIOGRAM (TEE);  Surgeon: Sueanne Margarita, MD;  Location: [facility] ENDOSCOPY;  Service: Cardiovascular;  Laterality: N/A;  . WISDOM TOOTH EXTRACTION  Current Outpatient Medications  Medication Sig Dispense Refill  . acetaminophen (TYLENOL) 325 MG tablet Take 2 tablets (650 mg total) by mouth every 6 (six) hours as needed for mild pain, fever or headache. 40 tablet 0  . amoxicillin (AMOXIL) 500 MG tablet Take 2,000 mg by mouth See admin instructions. Take 4 tablets (2000 mg) by mouth one hour prior  to dental appointments  5  . aspirin EC 81 MG EC tablet Take 1 tablet (81 mg total) by mouth daily.    Marland Kitchen lisinopril-hydrochlorothiazide (ZESTORETIC) 20-12.5 MG tablet Take 1 tablet by mouth daily. 90 tablet 2  . metoprolol tartrate (LOPRESSOR) 50 MG tablet Take 1 tablet (50 mg total) by mouth 2 (two) times daily. 60 tablet 5  . rosuvastatin (CRESTOR) 10 MG tablet Take 1 tablet (10 mg total) by mouth daily. 90 tablet 3  . warfarin (COUMADIN) 5 MG tablet Take 1 tablet by mouth daily or as directed by Coumadin clinic 115 tablet 3   No current facility-administered medications for this visit.     Allergies:   Patient has no known allergies.    Social History:  The patient  reports that she has quit smoking. She has never used smokeless tobacco. She reports current alcohol use. She reports that she does not use drugs.   Family History:  The patient's family history includes Cirrhosis in her father; Hypertension in her sister; Obesity in her mother.    ROS: All other systems are reviewed and negative. Unless otherwise mentioned in H&P    PHYSICAL EXAM: VS:  There were no vitals taken for this visit. , BMI There is no height or weight on file to calculate BMI. GEN: Well nourished, well developed BF  in no acute distress  HEENT: normal  Neck: no JVD, carotid bruits, or masses Cardiac: RRR [id number] systolic murmur with crisp mechanical valve click, rubs, or gallops,no edema  Sternal incision is healing well.  Respiratory:  clear to auscultation bilaterally, normal work of breathing GI: soft, nontender, nondistended, + BS MS: no deformity or atrophy  Skin: warm and dry, no rash Neuro:  Strength and sensation are intact Psych: euthymic mood, full affect   Recent Labs: No results found for requested labs within last 8760 hours.    Lipid Panel No results found for: CHOL, TRIG, HDL, CHOLHDL, VLDL, LDLCALC, LDLDIRECT   Labs dated 08/30/17: cholesterol 130, triglycerides 46, HDL 50, LDL 71.  Hgb 10.8. Normal chemistries and TSH.    Wt Readings from Last 3 Encounters:  09/20/18 215 lb (97.5 kg)  05/15/18 205 lb 6.4 oz (93.2 kg)  03/06/18 186 lb 3.2 oz (84.5 kg)      Other studies Reviewed: Echocardiogram Jun 16, 2017 Study Conclusions  - Left ventricle: The cavity size was normal. Systolic function was normal. The estimated ejection fraction was in the range of 60% to 65%. Wall motion was normal; there were no regional wall motion abnormalities. Doppler parameters are consistent with abnormal left ventricular relaxation (grade 1 diastolic dysfunction). - Aortic valve: Trileaflet; moderately thickened leaflets with 1cm x 1cm mass on the right coronary cusp, mild mobility, suggestive of vegetation. If no signs of infection, consider fibroelastoma. There was severe stenosis. Valve area (VTI): 1.32 cm^2. Valve area (Vmax): 1.41 cm^2. Valve area (Vmean): 1.28 cm^2.  ------------------------------------------------------------------- Study data: No prior study was available for comparison. Study status: Routine. Procedure: The patient reported no pain pre or post test. Transthoracic echocardiography. Image quality was adequate. Study completion: There were no complications. Transthoracic echocardiography.  M-mode, complete 2D, spectral Doppler, and color Doppler. Birthdate: Patient birthdate: March 01, 1976. Age: Patient is 43 yr old. Sex: Gender: female. BMI: 25 kg/m^2. Blood pressure: 120/54 Patient status: Inpatient. Study date: Study date: 05/25/2017. Study time: 09:48 AM. Location: Bedside.  -------------------------------------------------------------------  ------------------------------------------------------------------- Left ventricle: The cavity size was normal. Systolic function was normal. The estimated ejection fraction was in the range of 60% to 65%. Wall motion was normal; there were no regional wall motion  abnormalities. Doppler parameters are consistent with abnormal left ventricular relaxation (grade 1 diastolic dysfunction).  ------------------------------------------------------------------- Aortic valve: Trileaflet; moderately thickened leaflets with 1cm x 1cm mass on the right coronary cusp, mild mobility, suggestive of vegetation. If no signs of infection, consider fibroelastoma. Mobility was not restricted. Doppler: There was severe stenosis. There was no regurgitation. VTI ratio of LVOT to aortic valve: 0.42. Valve area (VTI): 1.32 cm^2. Indexed valve area (VTI): 0.74 cm^2/m^2. Peak velocity ratio of LVOT to aortic valve: 0.45. Valve area (Vmax): 1.41 cm^2. Indexed valve area (Vmax): 0.79 cm^2/m^2. Mean velocity ratio of LVOT to aortic valve: 0.41. Valve area (Vmean): 1.28 cm^2. Indexed valve area (Vmean): 0.72 cm^2/m^2. Mean gradient (S): 15 mm Hg. Peak gradient (S): 24 mm Hg.  ------------------------------------------------------------------- Aorta: Aortic root: The aortic root was normal in size.  ------------------------------------------------------------------- Mitral valve: Structurally normal valve. Mobility was not restricted. Doppler: Transvalvular velocity was within the normal range. There was no evidence for stenosis. There was no regurgitation. Peak gradient (D): 8 mm Hg.  ------------------------------------------------------------------- Left atrium: The atrium was normal in size.  ------------------------------------------------------------------- Right ventricle: The cavity size was normal. Wall thickness was normal. Systolic function was normal.  TEE  05/25/2017 Study Conclusions  - Left ventricle: The cavity size was normal. Systolic function was normal. The estimated ejection fraction was in the range of 60% to 65%. Wall motion was normal; there were no regional wall motion abnormalities. Doppler parameters are  consistent with abnormal left ventricular relaxation (grade 1 diastolic dysfunction). - Aortic valve: Trileaflet; moderately thickened leaflets with 1cm x 1cm mass on the right coronary cusp, mild mobility, suggestive of vegetation. If no signs of infection, consider fibroelastoma. There was severe stenosis. Valve area (VTI): 1.32 cm^2. Valve area (Vmax): 1.41 cm^2. Valve area (Vmean): 1.28 cm^2.  ------------------------------------------------------------------- Study data: No prior study was available for comparison. Study status: Routine. Procedure: The patient reported no pain pre or post test. Transthoracic echocardiography. Image quality was adequate. Study completion: There were no complications. Transthoracic echocardiography. M-mode, complete 2D, spectral Doppler, and color Doppler. Birthdate: Patient birthdate: [birth year]. Age: Patient is 43 yr old. Sex: Gender: female. BMI: 25 kg/m^2. Blood pressure: 120/54 Patient status: Inpatient. Study date: Study date: 05/25/2017. Study time: 09:48 AM. Location: Bedside.  -------------------------------------------------------------------  ------------------------------------------------------------------- Left ventricle: The cavity size was normal. Systolic function was normal. The estimated ejection fraction was in the range of 60% to 65%. Wall motion was normal; there were no regional wall motion abnormalities. Doppler parameters are consistent with abnormal left ventricular relaxation (grade 1 diastolic dysfunction).  ------------------------------------------------------------------- Aortic valve: Trileaflet; moderately thickened leaflets with 1cm x 1cm mass on the right coronary cusp, mild mobility, suggestive of vegetation. If no signs of infection, consider fibroelastoma. Mobility was not restricted. Doppler: There was severe stenosis. There was no regurgitation. VTI ratio  of LVOT to aortic valve: 0.42. Valve area (VTI): 1.32 cm^2. Indexed valve area (VTI): 0.74 cm^2/m^2. Peak velocity ratio of LVOT to aortic valve: 0.45. Valve area (Vmax): 1.41 cm^2. Indexed valve area (Vmax): 0.79 cm^2/m^2. Mean velocity  ratio of LVOT to aortic valve: 0.41. Valve area (Vmean): 1.28 cm^2. Indexed valve area (Vmean): 0.72 cm^2/m^2. Mean gradient (S): 15 mm Hg. Peak gradient (S): 24 mm Hg.  ------------------------------------------------------------------- Aorta: Aortic root: The aortic root was normal in size.  ------------------------------------------------------------------- Mitral valve: Structurally normal valve. Mobility was not restricted. Doppler: Transvalvular velocity was within the normal range. There was no evidence for stenosis. There was no regurgitation. Peak gradient (D): 8 mm Hg.  ------------------------------------------------------------------- Left atrium: The atrium was normal in size.  ------------------------------------------------------------------- Right ventricle: The cavity size was normal. Wall thickness was normal. Systolic function was normal.  Echo 02/17/18: Study Conclusions  - Left ventricle: The cavity size was normal. Systolic function was normal. The estimated ejection fraction was in the range of 55% to 60%. Wall motion was normal; there were no regional wall motion abnormalities. Left ventricular diastolic function parameters were normal. - Aortic valve: A mechanical prosthesis was present. There was moderate stenosis. There was mild regurgitation. Peak velocity (S): 375 cm/s. Mean gradient (S): 32 mm Hg. Valve area (VTI): 0.88 cm^2. Valve area (Vmax): 0.86 cm^2. Valve area (Vmean): 0.87 cm^2. - Mitral valve: Transvalvular velocity was within the normal range. There was no evidence for stenosis. There was no regurgitation. - Right ventricle: The cavity size was normal. Wall thickness was  normal. Systolic function was normal. - Right atrium: The atrium was mildly dilated. - Atrial septum: No defect or patent foramen ovale was identified by color flow Doppler. - Tricuspid valve: There was mild regurgitation. - Pulmonary arteries: Systolic pressure was within the normal range. PA peak pressure: 22 mm Hg (S). - Pericardium, extracardiac: A trivial pericardial effusion was identified.  Impressions:  - Compared with the echo 09/2017 the mean gradient across the mechanical aortic valve has increased from 20 mmHg to 32 mmHg. There are no obvious vegetations. Consider TEE to better evaluate.   TEE 02/19/18: Study Conclusions  - Left ventricle: Systolic function was normal. The estimated ejection fraction was in the range of 55% to 60%. Wall motion was normal; there were no regional wall motion abnormalities. - Aortic valve: Stable Mechanical AV prosthesis with no rocking motion, no visible vegetation, normal leaflet motion and no perivalvular AI. - Mitral valve: There was mild regurgitation. - Left atrium: No evidence of thrombus in the atrial cavity or appendage. - Right atrium: No evidence of thrombus in the atrial cavity or appendage. - Pulmonic valve: There was trivial regurgitation.  Impressions:  - Normal LVF with mild MR. There is a normal functioning mechanical aortic valve prosthesis with no perivalvular leak. The increased gradient across the AVR noted on TTE is likely related to prosthesis - patient mismatch. The AVR was a 85mm mechanical prosthesis.   ASSESSMENT AND PLAN:  1.AV endocarditis with a bicuspid AV. Severe AI.S/P aortic valve replacementwith a 21 mm mechanical valve -[employer] serial number [id number] by Dr. Nils Pyle in December 2018. This was completed in the setting of bacterial endocarditis and vegetation on a bicuspid valve. Subsequent Echo and TEE showed normally functioning valve  without vegetation in September 2019. Some prosthesis/patient mismatch with elevated gradient. She is on chronic coumadin. Routine SBE prophylaxis. She will need to get her periodontal disease taken care of to reduce risk of recurrent endocarditis.   2. PAD. Left leg claudication. Symptoms have improved with regular walking program. Will need to follow dopplers. Although her lipids have looked good in the past with last LDL of 71 she should still be on a statin  with PAD. Will start Crestor 10 mg daily. Repeat labs in 3 months  3. HTN - patient will monitor. Will increase Metoprolol to 50 mg bid since she felt better on higher dose and HR is elevated.    Clela Hagadorn Martinique MD, [facility]   04/27/2019 7:52 AM    [facility]

## 2019-04-29 ENCOUNTER — Ambulatory Visit: Payer: 59 | Admitting: Cardiology

## 2019-05-14 ENCOUNTER — Encounter: Payer: 59 | Admitting: Cardiothoracic Surgery

## 2019-05-21 ENCOUNTER — Encounter: Payer: 59 | Admitting: Cardiothoracic Surgery

## 2019-05-21 ENCOUNTER — Encounter: Payer: Self-pay | Admitting: Cardiothoracic Surgery

## 2019-05-22 ENCOUNTER — Encounter: Payer: Self-pay | Admitting: Cardiothoracic Surgery

## 2019-05-28 ENCOUNTER — Telehealth: Payer: Self-pay

## 2019-05-28 NOTE — Telephone Encounter (Signed)
lmom for overdue inr 

## 2019-06-20 ENCOUNTER — Ambulatory Visit (INDEPENDENT_AMBULATORY_CARE_PROVIDER_SITE_OTHER): Payer: 59 | Admitting: Pharmacist

## 2019-06-20 ENCOUNTER — Other Ambulatory Visit: Payer: Self-pay

## 2019-06-20 DIAGNOSIS — Z5181 Encounter for therapeutic drug level monitoring: Secondary | ICD-10-CM

## 2019-06-20 DIAGNOSIS — Z952 Presence of prosthetic heart valve: Secondary | ICD-10-CM

## 2019-06-20 LAB — POCT INR: INR: 2.2 (ref 2.0–3.0)

## 2019-06-23 ENCOUNTER — Encounter: Payer: 59 | Admitting: Physician Assistant

## 2019-06-25 NOTE — Progress Notes (Signed)
This encounter was created in error - please disregard.

## 2019-08-21 ENCOUNTER — Encounter: Payer: Self-pay | Admitting: Physician Assistant

## 2019-09-04 ENCOUNTER — Telehealth: Payer: Self-pay

## 2019-09-04 NOTE — Telephone Encounter (Signed)
lmomed for overdue inr 

## 2019-11-07 ENCOUNTER — Telehealth: Payer: Self-pay | Admitting: Pharmacist

## 2019-11-07 NOTE — Telephone Encounter (Signed)
Unable to reach by phone. Will send message @ myChart Lat INR on 06/20/2019. Ovedue by > 4 months

## 2019-11-11 ENCOUNTER — Telehealth: Payer: Self-pay

## 2019-11-11 NOTE — Telephone Encounter (Signed)
Called and spoke w/pt's mother who stated that she has since moved to [place] so I will close the anticoag encounterw/us at [facility] [employer]

## 2019-11-14 ENCOUNTER — Other Ambulatory Visit: Payer: Self-pay | Admitting: Cardiology

## 2019-11-14 MED ORDER — METOPROLOL TARTRATE 50 MG PO TABS: 50.0000 mg | ORAL_TABLET | Freq: Two times a day (BID) | ORAL | 5 refills | Status: DC

## 2019-11-14 MED ORDER — ROSUVASTATIN CALCIUM 10 MG PO TABS: 10.0000 mg | ORAL_TABLET | Freq: Every day | ORAL | 3 refills | Status: AC

## 2019-11-14 MED ORDER — LISINOPRIL-HYDROCHLOROTHIAZIDE 20-12.5 MG PO TABS: 1.0000 | ORAL_TABLET | Freq: Every day | ORAL | 2 refills | Status: AC

## 2019-11-14 NOTE — Telephone Encounter (Signed)
Sent RX to pharmacy.   Warfarin dose to be sent to anticoag to evaluate and submit.   Thanks!

## 2019-11-14 NOTE — Telephone Encounter (Signed)
New message   Patient needs new prescriptions for all medications on her medication list sent to [place], [place]

## 2019-11-17 ENCOUNTER — Other Ambulatory Visit: Payer: Self-pay | Admitting: Pharmacist

## 2019-11-17 MED ORDER — WARFARIN SODIUM 5 MG PO TABS: ORAL_TABLET | ORAL | 0 refills | Status: AC

## 2019-11-17 NOTE — Telephone Encounter (Signed)
Patient last seen at coumadin clinic on January 2021. Not returning calls or myChart messages. Informed by mother, patient moved to [place].  We received warfarin refill request from [facility] in [place].  Will send 1 week warfarin therapy (based on most recent documented dose) to allow patient to find MD in [place].

## 2020-08-03 ENCOUNTER — Other Ambulatory Visit: Payer: Self-pay

## 2020-08-06 ENCOUNTER — Other Ambulatory Visit: Payer: Self-pay

## 2020-08-06 MED ORDER — METOPROLOL TARTRATE 50 MG PO TABS: 50.0000 mg | ORAL_TABLET | Freq: Two times a day (BID) | ORAL | 0 refills | Status: DC

## 2020-09-07 ENCOUNTER — Other Ambulatory Visit: Payer: Self-pay

## 2020-09-07 MED ORDER — METOPROLOL TARTRATE 50 MG PO TABS: 50.0000 mg | ORAL_TABLET | Freq: Two times a day (BID) | ORAL | 1 refills | Status: AC
